# Patient Record
Sex: Female | Born: 1980 | Race: White | Hispanic: No | Smoking: Former smoker
Health system: Southern US, Community
[De-identification: ages and names within clinical notes are randomized; demographics above are authoritative.]

## PROBLEM LIST (undated history)

## (undated) DIAGNOSIS — F32A Depression, unspecified: Secondary | ICD-10-CM

## (undated) DIAGNOSIS — K529 Noninfective gastroenteritis and colitis, unspecified: Secondary | ICD-10-CM

## (undated) DIAGNOSIS — F329 Major depressive disorder, single episode, unspecified: Secondary | ICD-10-CM

## (undated) DIAGNOSIS — R519 Headache, unspecified: Secondary | ICD-10-CM

## (undated) DIAGNOSIS — G43909 Migraine, unspecified, not intractable, without status migrainosus: Secondary | ICD-10-CM

## (undated) DIAGNOSIS — D497 Neoplasm of unspecified behavior of endocrine glands and other parts of nervous system: Secondary | ICD-10-CM

## (undated) DIAGNOSIS — E039 Hypothyroidism, unspecified: Secondary | ICD-10-CM

## (undated) DIAGNOSIS — R51 Headache: Secondary | ICD-10-CM

## (undated) DIAGNOSIS — R45851 Suicidal ideations: Secondary | ICD-10-CM

## (undated) HISTORY — PX: TUBAL LIGATION: SHX77

## (undated) HISTORY — PX: TONSILLECTOMY: SUR1361

## (undated) HISTORY — DX: Headache: R51

## (undated) HISTORY — DX: Migraine, unspecified, not intractable, without status migrainosus: G43.909

## (undated) HISTORY — DX: Headache, unspecified: R51.9

## (undated) HISTORY — DX: Depression, unspecified: F32.A

## (undated) HISTORY — DX: Major depressive disorder, single episode, unspecified: F32.9

## (undated) SURGERY — COLONOSCOPY WITH PROPOFOL
Anesthesia: General

---
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
## 2004-02-28 ENCOUNTER — Emergency Department (HOSPITAL_COMMUNITY): Admission: EM | Admit: 2004-02-28 | Discharge: 2004-02-28 | Payer: Self-pay | Admitting: Emergency Medicine

## 2010-06-19 HISTORY — PX: BREAST CYST ASPIRATION: SHX578

## 2010-08-05 ENCOUNTER — Emergency Department (HOSPITAL_COMMUNITY)
Admission: EM | Admit: 2010-08-05 | Discharge: 2010-08-06 | Disposition: A | Discharge status: Home / Self Care | Payer: Self-pay | Attending: Emergency Medicine | Admitting: Emergency Medicine

## 2010-08-05 DIAGNOSIS — E039 Hypothyroidism, unspecified: Secondary | ICD-10-CM | POA: Insufficient documentation

## 2010-08-05 DIAGNOSIS — A499 Bacterial infection, unspecified: Secondary | ICD-10-CM | POA: Insufficient documentation

## 2010-08-05 DIAGNOSIS — M545 Low back pain, unspecified: Secondary | ICD-10-CM | POA: Insufficient documentation

## 2010-08-05 DIAGNOSIS — B9689 Other specified bacterial agents as the cause of diseases classified elsewhere: Secondary | ICD-10-CM | POA: Insufficient documentation

## 2010-08-05 DIAGNOSIS — N760 Acute vaginitis: Secondary | ICD-10-CM | POA: Insufficient documentation

## 2010-08-05 DIAGNOSIS — R109 Unspecified abdominal pain: Secondary | ICD-10-CM | POA: Insufficient documentation

## 2010-08-05 LAB — WET PREP, GENITAL
Trich, Wet Prep: NONE SEEN
Yeast Wet Prep HPF POC: NONE SEEN

## 2010-08-05 LAB — URINALYSIS, ROUTINE W REFLEX MICROSCOPIC
Bilirubin Urine: NEGATIVE
Ketones, ur: NEGATIVE mg/dL
Leukocytes, UA: NEGATIVE
Protein, ur: NEGATIVE mg/dL
Urine Glucose, Fasting: NEGATIVE mg/dL

## 2010-08-05 LAB — URINE MICROSCOPIC-ADD ON

## 2010-08-06 ENCOUNTER — Emergency Department (HOSPITAL_COMMUNITY): Payer: Self-pay

## 2010-08-06 LAB — CBC
MCV: 99.7 fL (ref 78.0–100.0)
Platelets: 217 10*3/uL (ref 150–400)
RBC: 3.69 MIL/uL — ABNORMAL LOW (ref 3.87–5.11)
RDW: 13.1 % (ref 11.5–15.5)
WBC: 7.9 10*3/uL (ref 4.0–10.5)

## 2010-08-06 LAB — POCT I-STAT, CHEM 8
Calcium, Ion: 1.13 mmol/L (ref 1.12–1.32)
Glucose, Bld: 98 mg/dL (ref 70–99)
HCT: 38 % (ref 36.0–46.0)
Hemoglobin: 12.9 g/dL (ref 12.0–15.0)
TCO2: 25 mmol/L (ref 0–100)

## 2010-08-06 LAB — DIFFERENTIAL
Basophils Relative: 1 % (ref 0–1)
Eosinophils Absolute: 0.1 10*3/uL (ref 0.0–0.7)
Eosinophils Relative: 1 % (ref 0–5)
Neutrophils Relative %: 58 % (ref 43–77)

## 2010-08-06 LAB — BASIC METABOLIC PANEL
BUN: 5 mg/dL — ABNORMAL LOW (ref 6–23)
Chloride: 104 mEq/L (ref 96–112)
Creatinine, Ser: 0.65 mg/dL (ref 0.4–1.2)
GFR calc Af Amer: >60 mL/min (ref >60)
GFR calc non Af Amer: >60 mL/min (ref >60)
Potassium: 4.1 mEq/L (ref 3.5–5.1)

## 2010-08-06 LAB — OCCULT BLOOD, POC DEVICE: Fecal Occult Bld: NEGATIVE

## 2010-08-06 LAB — GC/CHLAMYDIA PROBE AMP, GENITAL
Chlamydia, DNA Probe: NEGATIVE
GC Probe Amp, Genital: NEGATIVE

## 2011-05-04 ENCOUNTER — Emergency Department (HOSPITAL_COMMUNITY)
Admission: EM | Admit: 2011-05-04 | Discharge: 2011-05-04 | Disposition: A | Discharge status: Home / Self Care | Payer: Self-pay | Attending: Emergency Medicine | Admitting: Emergency Medicine

## 2011-05-04 ENCOUNTER — Encounter: Payer: Self-pay | Admitting: Emergency Medicine

## 2011-05-04 ENCOUNTER — Emergency Department (HOSPITAL_COMMUNITY): Payer: Self-pay

## 2011-05-04 ENCOUNTER — Other Ambulatory Visit: Payer: Self-pay

## 2011-05-04 DIAGNOSIS — R0602 Shortness of breath: Secondary | ICD-10-CM | POA: Insufficient documentation

## 2011-05-04 DIAGNOSIS — Z79899 Other long term (current) drug therapy: Secondary | ICD-10-CM | POA: Insufficient documentation

## 2011-05-04 DIAGNOSIS — R000 Tachycardia, unspecified: Secondary | ICD-10-CM

## 2011-05-04 DIAGNOSIS — R112 Nausea with vomiting, unspecified: Secondary | ICD-10-CM | POA: Insufficient documentation

## 2011-05-04 DIAGNOSIS — R079 Chest pain, unspecified: Secondary | ICD-10-CM | POA: Insufficient documentation

## 2011-05-04 DIAGNOSIS — E039 Hypothyroidism, unspecified: Secondary | ICD-10-CM | POA: Insufficient documentation

## 2011-05-04 DIAGNOSIS — I498 Other specified cardiac arrhythmias: Secondary | ICD-10-CM | POA: Insufficient documentation

## 2011-05-04 DIAGNOSIS — L089 Local infection of the skin and subcutaneous tissue, unspecified: Secondary | ICD-10-CM | POA: Insufficient documentation

## 2011-05-04 DIAGNOSIS — R6889 Other general symptoms and signs: Secondary | ICD-10-CM | POA: Insufficient documentation

## 2011-05-04 HISTORY — DX: Hypothyroidism, unspecified: E03.9

## 2011-05-04 LAB — URINALYSIS, ROUTINE W REFLEX MICROSCOPIC
Bilirubin Urine: NEGATIVE
Ketones, ur: NEGATIVE mg/dL
Leukocytes, UA: NEGATIVE
Nitrite: NEGATIVE
Protein, ur: NEGATIVE mg/dL
Urobilinogen, UA: 0.2 mg/dL (ref 0.0–1.0)

## 2011-05-04 LAB — TSH: TSH: 1.624 u[IU]/mL (ref 0.350–4.500)

## 2011-05-04 LAB — BASIC METABOLIC PANEL
Calcium: 10.3 mg/dL (ref 8.4–10.5)
GFR calc non Af Amer: >90 mL/min (ref >90)
Glucose, Bld: 156 mg/dL — ABNORMAL HIGH (ref 70–99)
Potassium: 3.2 mEq/L — ABNORMAL LOW (ref 3.5–5.1)
Sodium: 136 mEq/L (ref 135–145)

## 2011-05-04 LAB — POCT I-STAT TROPONIN I: Troponin i, poc: 0 ng/mL (ref 0.00–0.08)

## 2011-05-04 LAB — CBC
Hemoglobin: 14.7 g/dL (ref 12.0–15.0)
MCH: 33.8 pg (ref 26.0–34.0)
MCHC: 34.8 g/dL (ref 30.0–36.0)
Platelets: 286 10*3/uL (ref 150–400)
RBC: 4.35 MIL/uL (ref 3.87–5.11)

## 2011-05-04 LAB — POCT PREGNANCY, URINE: Preg Test, Ur: NEGATIVE

## 2011-05-04 MED ORDER — DOXYCYCLINE HYCLATE 100 MG PO CAPS: 100.0000 mg | ORAL_CAPSULE | Freq: Two times a day (BID) | ORAL | Status: AC

## 2011-05-04 MED ORDER — LORAZEPAM 2 MG/ML IJ SOLN
1.0000 mg | Freq: Once | INTRAMUSCULAR | Status: AC
  Administered 2011-05-04: 1 mg via INTRAVENOUS
  Filled 2011-05-04: qty 1

## 2011-05-04 MED ORDER — LORAZEPAM 1 MG PO TABS: 1.0000 mg | ORAL_TABLET | Freq: Three times a day (TID) | ORAL | Status: AC | PRN

## 2011-05-04 MED ORDER — IOHEXOL 350 MG/ML SOLN
100.0000 mL | Freq: Once | INTRAVENOUS | Status: AC | PRN
  Administered 2011-05-04: 100 mL via INTRAVENOUS

## 2011-05-04 NOTE — ED Notes (Signed)
Pt c/o CP since 10 pm this evening.  States pain started after taking "sinus medicine" at 9:30.  Describes pain as burning in the substernal area.   Feels tense.  Did vomit earlier, no fevers/chills.  Feels SOB at this time.  Pain does not increase with palpation or inspiration.  No meds PTA.

## 2011-05-04 NOTE — ED Provider Notes (Signed)
Patient seen by me when she requested antibiotic and anxiety med at dispo.  Had been discussed with prior MD per nursing.  Patient not suicidal and felt appropriate for short course of ativan.  Doxy written for as previously recommended by other ED MD.  Cyndra Numbers, MD 05/04/11 [phone]

## 2011-05-04 NOTE — ED Notes (Signed)
PT. REPORTS MIDSTERNAL CHEST PAIN "BURNING" ONSET THIS EVENING   ,  SOB WITH NO COUGH , VOMITTING , DENIES DIAPHORESIS.

## 2011-05-04 NOTE — ED Provider Notes (Signed)
History     CSN: [id number] Arrival date & time: 05/04/2011  1:53 AM   First MD Initiated Contact with Patient 05/04/11 [phone]      Chief Complaint  Patient presents with  . Chest Pain    (Consider location/radiation/quality/duration/timing/severity/associated sxs/prior treatment) HPI So 30 year old white female who had the sudden onset of chest pain yesterday evening about 10 PM. She describes it as burning in nature with some tightness as well. It is moderate to severe. It is mildly exacerbated by deep breath. She also complains of shortness of breath specifically difficulty taking a deep breath. Soon after the onset of the pain she developed nausea and vomiting. The vomiting is a proved but there is some remaining nausea. She says she has felt ill recently and has a stuffy nose. She has taken over-the-counter medication but nothing that she has not taken in the past. She is on Adderall but denies taking any excess; her last dose was yesterday morning. She denies street drug use. She denies recent prolonged automobile or airplane trip. She denies swelling or pain in her lower extremities.  Past Medical History  Diagnosis Date  . Hypothyroidism     Past Surgical History  Procedure Date  . Tubal ligation   . Tonsillectomy     No family history on file.  History  Substance Use Topics  . Smoking status: Current Everyday Smoker  . Smokeless tobacco: Not on file  . Alcohol Use: No    OB History    Grav Para Term Preterm Abortions TAB SAB Ect Mult Living                  Review of Systems  All other systems reviewed and are negative.    Allergies  Review of patient's allergies indicates no known allergies.  Home Medications   Current Outpatient Rx  Name Route Sig Dispense Refill  . ACETAMINOPHEN 325 MG PO TABS Oral Take 325 mg by mouth every 6 (six) hours as needed.      . AMPHETAMINE-DEXTROAMPHETAMINE 30 MG PO TABS Oral Take 30 mg by mouth daily.      Marlin Canary  HEADACHE PO Oral Take 1 packet by mouth daily as needed. For pain     . LEVOTHYROXINE SODIUM 175 MCG PO TABS Oral Take 175 mcg by mouth daily.      Marland Kitchen PAROXETINE HCL 20 MG PO TABS Oral Take 20 mg by mouth every morning.      Marland Kitchen PHENYLEPHRINE HCL 10 MG PO TABS Oral Take 5 mg by mouth every 4 (four) hours as needed. For congestion       BP 123/81  Pulse 117  Temp(Src) 98 F (36.7 C) (Oral)  Resp 14  SpO2 100%  LMP 04/03/2011  Physical Exam General: Well-developed, well-nourished female in no acute distress; appearance consistent with age of record HENT: normocephalic, atraumatic Eyes: pupils equal round and reactive to light; extraocular muscles intact Neck: supple Heart: regular rate and rhythm; no murmurs; tachycardic Lungs: clear to auscultation bilaterally; shallow respirations Abdomen: soft; nontender; nondistended Extremities: No deformity; full range of motion; pulses normal; no edema or tenderness Neurologic: Awake, alert and oriented; motor function intact in all extremities and symmetric; no facial droop Skin: Warm and dry Psychiatric: Anxious    ED Course  Procedures (including critical care time)     MDM   Nursing notes and vitals signs, including pulse oximetry, reviewed.  Summary of this visit's results, reviewed by myself:  Labs:  Results  for orders placed during the hospital encounter of 05/04/11  CBC      Component Value Range   WBC 12.1 (*) 4.0 - 10.5 (K/uL)   RBC 4.35  3.87 - 5.11 (MIL/uL)   Hemoglobin 14.7  12.0 - 15.0 (g/dL)   HCT 40.9  81.1 - 91.4 (%)   MCV 97.2  78.0 - 100.0 (fL)   MCH 33.8  26.0 - 34.0 (pg)   MCHC 34.8  30.0 - 36.0 (g/dL)   RDW 78.2  95.6 - 21.3 (%)   Platelets 286  150 - 400 (K/uL)  BASIC METABOLIC PANEL      Component Value Range   Sodium 136  135 - 145 (mEq/L)   Potassium 3.2 (*) 3.5 - 5.1 (mEq/L)   Chloride 96  96 - 112 (mEq/L)   CO2 24  19 - 32 (mEq/L)   Glucose, Bld 156 (*) 70 - 99 (mg/dL)   BUN 10  6 - 23 (mg/dL)    Creatinine, Ser 0.86  0.50 - 1.10 (mg/dL)   Calcium 57.8  8.4 - 10.5 (mg/dL)   GFR calc non Af Amer >90  >90 (mL/min)   GFR calc Af Amer >90  >90 (mL/min)  POCT I-STAT TROPONIN I      Component Value Range   Troponin i, poc 0.00  0.00 - 0.08 (ng/mL)   Comment 3           URINALYSIS, ROUTINE W REFLEX MICROSCOPIC      Component Value Range   Color, Urine YELLOW  YELLOW    Appearance CLEAR  CLEAR    Specific Gravity, Urine 1.006  1.005 - 1.030    pH 7.5  5.0 - 8.0    Glucose, UA NEGATIVE  NEGATIVE (mg/dL)   Hgb urine dipstick NEGATIVE  NEGATIVE    Bilirubin Urine NEGATIVE  NEGATIVE    Ketones, ur NEGATIVE  NEGATIVE (mg/dL)   Protein, ur NEGATIVE  NEGATIVE (mg/dL)   Urobilinogen, UA 0.2  0.0 - 1.0 (mg/dL)   Nitrite NEGATIVE  NEGATIVE    Leukocytes, UA NEGATIVE  NEGATIVE   PREGNANCY, URINE      Component Value Range   Preg Test, Ur NEGATIVE    D-DIMER, QUANTITATIVE      Component Value Range   D-Dimer, Quant 0.46  0.00 - 0.48 (ug/mL-FEU)  POCT PREGNANCY, URINE      Component Value Range   Preg Test, Ur NEGATIVE    POCT I-STAT TROPONIN I      Component Value Range   Troponin i, poc 0.00  0.00 - 0.08 (ng/mL)   Comment 3             EKG Interpretation:  Date & Time: 05/04/2011 1:52 AM  Rate: 124  Rhythm: sinus tachycardia  QRS Axis: normal  Intervals: normal  ST/T Wave abnormalities: normal  Conduction Disutrbances:none  Narrative Interpretation:   Old EKG Reviewed: none available  6:42 AM Awaiting CT angina chest or unexplained symptoms. As an incidental patient complains of frequent pustules on her face for the past several weeks. She is being treated with Bactroban topically. She states they keep recurring and she request a by mouth antibiotic.   Dg Chest 2 View  05/04/2011  *RADIOLOGY REPORT*  Clinical Data: 30 year old female with shortness of breath, chest pain, vomiting.  CHEST - 2 VIEW  Comparison: None.  Findings: Normal lung volumes. Normal cardiac size and  mediastinal contours.  Visualized tracheal air column is within normal limits. No pneumothorax  or pneumoperitoneum.  The lungs are clear.  No pleural effusion.  Negative visualized bowel gas pattern.  No osseous abnormality identified.  IMPRESSION: Negative, no acute cardiopulmonary abnormality.  Original Report Authenticated By: Harley Hallmark, M.D.   Ct Angio Chest W/cm &/or Wo Cm  05/04/2011  *RADIOLOGY REPORT*  Clinical Data:  Chest pain.  Shortness of breath.  Pain began after taking sinus -7.  CT ANGIOGRAPHY CHEST WITH CONTRAST  Technique:  Multidetector CT imaging of the chest was performed using the standard protocol during bolus administration of intravenous contrast.  Multiplanar CT image reconstructions including MIPs were obtained to evaluate the vascular anatomy.  Contrast: OMNIPAQUE IOHEXOL 350 MG/ML IV SOLN  Comparison:  Two-view chest x-ray 05/04/2011.  Findings:  Pulmonary arterial opacification is satisfactory.  There are no focal filling defects to suggest pulmonary emboli. The study is mildly degraded at the lung bases due to patient breathing motion.  This limits evaluation of subsegmental vessels at the bases.  The heart size is normal.  No significant mediastinal or axillary adenopathy is present.  Limited imaging of the upper abdomen is unremarkable.  Minimal dependent atelectasis is present at the lung bases bilaterally.  The lungs are otherwise clear.  Review of the MIP images confirms the above findings.  IMPRESSION:  1.  No evidence of pulmonary embolus. 2.  The study is mildly degraded by patient breathing motion in both mid chest and lung bases.  This limits evaluation of subsegmental vessels.  Original Report Authenticated By: Jamesetta Orleans. MATTERN, M.D.   7:23 AM The patient denies any chest discomfort at this time. It resolved after she was given Ativan IV. She does not feel dyspneic. She continues to be tachycardic in the 130s. On further questioning she now admits  that she took Sudafed PE yesterday evening about an hour before her symptoms began. This could represent a severe reaction to the phenylephrine, especially given that she already takes Adderall.   7:42 AM Patient ambulating without chest pain, dyspnea or lightheadedness. Sinus tachycardia persists. TSH and free T4 level sent and patient will followup with her primary care physician at [facility].                 Hanley Seamen, MD 05/04/11 [phone]

## 2011-05-04 NOTE — ED Notes (Signed)
Pt. oob gait steady, walked pt. Around the unit. Pt. Denies any sob or chest pain or dizziness.

## 2011-09-13 ENCOUNTER — Encounter (HOSPITAL_COMMUNITY): Payer: Self-pay | Admitting: *Deleted

## 2011-09-13 ENCOUNTER — Emergency Department (HOSPITAL_COMMUNITY)
Admission: EM | Admit: 2011-09-13 | Discharge: 2011-09-13 | Disposition: A | Discharge status: Home / Self Care | Payer: Self-pay | Attending: Emergency Medicine | Admitting: Emergency Medicine

## 2011-09-13 DIAGNOSIS — S1191XA Laceration without foreign body of unspecified part of neck, initial encounter: Secondary | ICD-10-CM

## 2011-09-13 DIAGNOSIS — X789XXA Intentional self-harm by unspecified sharp object, initial encounter: Secondary | ICD-10-CM | POA: Insufficient documentation

## 2011-09-13 DIAGNOSIS — S1190XA Unspecified open wound of unspecified part of neck, initial encounter: Secondary | ICD-10-CM | POA: Insufficient documentation

## 2011-09-13 DIAGNOSIS — S61509A Unspecified open wound of unspecified wrist, initial encounter: Secondary | ICD-10-CM | POA: Insufficient documentation

## 2011-09-13 DIAGNOSIS — S61511A Laceration without foreign body of right wrist, initial encounter: Secondary | ICD-10-CM

## 2011-09-13 DIAGNOSIS — E039 Hypothyroidism, unspecified: Secondary | ICD-10-CM | POA: Insufficient documentation

## 2011-09-13 DIAGNOSIS — F32A Depression, unspecified: Secondary | ICD-10-CM

## 2011-09-13 DIAGNOSIS — Z79899 Other long term (current) drug therapy: Secondary | ICD-10-CM | POA: Insufficient documentation

## 2011-09-13 DIAGNOSIS — F329 Major depressive disorder, single episode, unspecified: Secondary | ICD-10-CM

## 2011-09-13 HISTORY — DX: Suicidal ideations: R45.851

## 2011-09-13 LAB — POCT PREGNANCY, URINE: Preg Test, Ur: NEGATIVE

## 2011-09-13 LAB — BASIC METABOLIC PANEL
CO2: 25 mEq/L (ref 19–32)
Chloride: 103 mEq/L (ref 96–112)
GFR calc Af Amer: >90 mL/min (ref >90)
Potassium: 4.4 mEq/L (ref 3.5–5.1)
Sodium: 136 mEq/L (ref 135–145)

## 2011-09-13 LAB — RAPID URINE DRUG SCREEN, HOSP PERFORMED
Barbiturates: NOT DETECTED
Benzodiazepines: NOT DETECTED
Tetrahydrocannabinol: NOT DETECTED

## 2011-09-13 LAB — DIFFERENTIAL
Basophils Absolute: 0 10*3/uL (ref 0.0–0.1)
Lymphocytes Relative: 28 % (ref 12–46)
Neutro Abs: 7.2 10*3/uL (ref 1.7–7.7)
Neutrophils Relative %: 65 % (ref 43–77)

## 2011-09-13 LAB — CBC
HCT: 41.8 % (ref 36.0–46.0)
Platelets: 238 10*3/uL (ref 150–400)
RDW: 13 % (ref 11.5–15.5)
WBC: 11.1 10*3/uL — ABNORMAL HIGH (ref 4.0–10.5)

## 2011-09-13 MED ORDER — PAROXETINE HCL 40 MG PO TABS: 40.0000 mg | ORAL_TABLET | ORAL | Status: DC

## 2011-09-13 MED ORDER — LORAZEPAM 1 MG PO TABS
1.0000 mg | ORAL_TABLET | Freq: Once | ORAL | Status: AC
  Administered 2011-09-13: 1 mg via ORAL
  Filled 2011-09-13: qty 1

## 2011-09-13 MED ORDER — SODIUM CHLORIDE 0.9 % IV BOLUS (SEPSIS)
1000.0000 mL | Freq: Once | INTRAVENOUS | Status: AC
  Administered 2011-09-13: 1000 mL via INTRAVENOUS

## 2011-09-13 MED ORDER — TETANUS-DIPHTH-ACELL PERTUSSIS 5-2.5-18.5 LF-MCG/0.5 IM SUSP
0.5000 mL | Freq: Once | INTRAMUSCULAR | Status: AC
  Administered 2011-09-13: 0.5 mL via INTRAMUSCULAR
  Filled 2011-09-13: qty 0.5

## 2011-09-13 MED ORDER — DIAZEPAM 5 MG PO TABS: 5.0000 mg | ORAL_TABLET | Freq: Every day | ORAL | Status: AC | PRN

## 2011-09-13 NOTE — Discharge Instructions (Signed)
Follow up with psychiatrist as instructed and shown in your handouts
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
## 2011-09-13 NOTE — ED Notes (Signed)
Rx given x2 D/c instructions reviewed w/ pt - pt denies any further questions or concerns at present.   
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
## 2011-09-13 NOTE — ED Notes (Signed)
Faxed telespsych paperwork 

## 2011-09-13 NOTE — ED Notes (Signed)
Pt currently in telepsych in rm 30
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
## 2011-09-14 NOTE — ED Provider Notes (Signed)
I saw and evaluated the patient, reviewed the resident's note and I agree with the findings and plan.   Ashlen Kiger, MD 09/14/11 1712 

## 2012-03-27 ENCOUNTER — Emergency Department (HOSPITAL_COMMUNITY)
Admission: EM | Admit: 2012-03-27 | Discharge: 2012-03-28 | Disposition: A | Discharge status: Home / Self Care | Payer: Medicaid Other | Attending: Emergency Medicine | Admitting: Emergency Medicine

## 2012-03-27 ENCOUNTER — Encounter (HOSPITAL_COMMUNITY): Payer: Self-pay | Admitting: Emergency Medicine

## 2012-03-27 DIAGNOSIS — N390 Urinary tract infection, site not specified: Secondary | ICD-10-CM | POA: Insufficient documentation

## 2012-03-27 DIAGNOSIS — F17200 Nicotine dependence, unspecified, uncomplicated: Secondary | ICD-10-CM | POA: Insufficient documentation

## 2012-03-27 DIAGNOSIS — E039 Hypothyroidism, unspecified: Secondary | ICD-10-CM | POA: Insufficient documentation

## 2012-03-27 HISTORY — DX: Neoplasm of unspecified behavior of endocrine glands and other parts of nervous system: D49.7

## 2012-03-27 NOTE — ED Notes (Addendum)
Pt reports having lower back pain, dysuria, hematuria

## 2012-03-28 LAB — URINE MICROSCOPIC-ADD ON

## 2012-03-28 LAB — URINALYSIS, ROUTINE W REFLEX MICROSCOPIC
Nitrite: NEGATIVE
Specific Gravity, Urine: 1.005 (ref 1.005–1.030)
Urobilinogen, UA: 0.2 mg/dL (ref 0.0–1.0)
pH: 6 (ref 5.0–8.0)

## 2012-03-28 MED ORDER — CIPROFLOXACIN HCL 500 MG PO TABS: 500.0000 mg | ORAL_TABLET | Freq: Two times a day (BID) | ORAL | Status: DC

## 2012-03-28 MED ORDER — HYDROCODONE-ACETAMINOPHEN 5-325 MG PO TABS
2.0000 | ORAL_TABLET | Freq: Once | ORAL | Status: AC
  Administered 2012-03-28: 2 via ORAL
  Filled 2012-03-28: qty 2

## 2012-03-28 MED ORDER — FLUCONAZOLE 150 MG PO TABS: 150.0000 mg | ORAL_TABLET | Freq: Once | ORAL | Status: DC

## 2012-03-28 MED ORDER — PHENAZOPYRIDINE HCL 200 MG PO TABS: 200.0000 mg | ORAL_TABLET | Freq: Three times a day (TID) | ORAL | Status: DC | PRN

## 2012-03-28 MED ORDER — HYDROCODONE-ACETAMINOPHEN 5-500 MG PO TABS: 1.0000 | ORAL_TABLET | Freq: Four times a day (QID) | ORAL | Status: DC | PRN

## 2012-03-28 NOTE — ED Provider Notes (Signed)
History     CSN: [id number]  Arrival date & time 03/27/12  2217   First MD Initiated Contact with Patient 03/27/12 2316      Chief Complaint  Patient presents with  . Urinary Tract Infection    (Consider location/radiation/quality/duration/timing/severity/associated sxs/prior treatment) Patient is a 31 y.o. female presenting with urinary tract infection. The history is provided by the patient.  Urinary Tract Infection This is a new problem. The current episode started 2 days ago. The problem occurs constantly. The problem has been gradually worsening. Associated symptoms include abdominal pain. Exacerbated by: urinating. Nothing relieves the symptoms. She has tried acetaminophen for the symptoms. The treatment provided no relief.    Past Medical History  Diagnosis Date  . Suicidal intent   . Hypothyroidism   . Pituitary tumor     Past Surgical History  Procedure Date  . Tubal ligation   . Tonsillectomy     History reviewed. No pertinent family history.  History  Substance Use Topics  . Smoking status: Current Every Day Smoker -- 1.0 packs/day    Types: Cigarettes  . Smokeless tobacco: Not on file  . Alcohol Use: No    OB History    Grav Para Term Preterm Abortions TAB SAB Ect Mult Living                  Review of Systems  Gastrointestinal: Positive for abdominal pain.  All other systems reviewed and are negative.    Allergies  Review of patient's allergies indicates no known allergies.  Home Medications   Current Outpatient Rx  Name Route Sig Dispense Refill  . AMPHETAMINE-DEXTROAMPHETAMINE 30 MG PO TABS Oral Take 30 mg by mouth daily.      Marland Kitchen FLUOXETINE HCL 20 MG PO CAPS Oral Take 20 mg by mouth daily.    Marland Kitchen LEVOTHYROXINE SODIUM 175 MCG PO TABS Oral Take 175 mcg by mouth daily.      Marland Kitchen LORAZEPAM 0.5 MG PO TABS Oral Take 0.5 mg by mouth at bedtime as needed. For anxiety/sleep    . NAPROXEN SODIUM 220 MG PO TABS Oral Take 220 mg by mouth 2 (two) times  daily with a meal.      BP 107/69  Pulse 91  Temp 97.9 F (36.6 C) (Oral)  Resp 18  SpO2 100%  LMP 03/22/2012  Physical Exam  Nursing note and vitals reviewed. Constitutional: She is oriented to person, place, and time. She appears well-developed and well-nourished. No distress.  HENT:  Head: Normocephalic and atraumatic.  Neck: Normal range of motion. Neck supple.  Cardiovascular: Normal rate and regular rhythm.  Exam reveals no gallop and no friction rub.   No murmur heard. Pulmonary/Chest: Effort normal and breath sounds normal. No respiratory distress. She has no wheezes.  Abdominal: Soft. Bowel sounds are normal. She exhibits no distension. There is no tenderness.  Musculoskeletal: Normal range of motion.  Neurological: She is alert and oriented to person, place, and time.  Skin: Skin is warm and dry. She is not diaphoretic.    ED Course  Procedures (including critical care time)  Labs Reviewed  URINALYSIS, ROUTINE W REFLEX MICROSCOPIC - Abnormal; Notable for the following:    APPearance CLOUDY (*)     Hgb urine dipstick MODERATE (*)     Leukocytes, UA MODERATE (*)     All other components within normal limits  URINE MICROSCOPIC-ADD ON - Abnormal; Notable for the following:    Squamous Epithelial / LPF MANY (*)  Bacteria, UA MANY (*)     All other components within normal limits  POCT PREGNANCY, URINE   No results found.   No diagnosis found.    MDM  The patient presents with dysuria, back pain that is consistent with a uti.  The urine is diagnostic of this as well.  This does not sound like a kidney stone.  She will be treated with cipro, pyridium, and lortab.  She is to return prn if she worsens.          Geoffery Lyons, MD 03/28/12 [phone]

## 2014-03-20 ENCOUNTER — Encounter (HOSPITAL_COMMUNITY): Payer: Self-pay | Admitting: Emergency Medicine

## 2014-03-20 ENCOUNTER — Emergency Department (HOSPITAL_COMMUNITY)
Admission: EM | Admit: 2014-03-20 | Discharge: 2014-03-20 | Disposition: A | Discharge status: Home / Self Care | Payer: Medicaid Other | Attending: Emergency Medicine | Admitting: Emergency Medicine

## 2014-03-20 ENCOUNTER — Emergency Department (HOSPITAL_COMMUNITY): Payer: Medicaid Other

## 2014-03-20 DIAGNOSIS — Z720 Tobacco use: Secondary | ICD-10-CM | POA: Diagnosis not present

## 2014-03-20 DIAGNOSIS — R1013 Epigastric pain: Secondary | ICD-10-CM | POA: Insufficient documentation

## 2014-03-20 DIAGNOSIS — R110 Nausea: Secondary | ICD-10-CM | POA: Insufficient documentation

## 2014-03-20 DIAGNOSIS — R0602 Shortness of breath: Secondary | ICD-10-CM | POA: Diagnosis not present

## 2014-03-20 DIAGNOSIS — Z8639 Personal history of other endocrine, nutritional and metabolic disease: Secondary | ICD-10-CM | POA: Diagnosis not present

## 2014-03-20 DIAGNOSIS — Z9851 Tubal ligation status: Secondary | ICD-10-CM | POA: Insufficient documentation

## 2014-03-20 DIAGNOSIS — Z3202 Encounter for pregnancy test, result negative: Secondary | ICD-10-CM | POA: Diagnosis not present

## 2014-03-20 DIAGNOSIS — Z8659 Personal history of other mental and behavioral disorders: Secondary | ICD-10-CM | POA: Diagnosis not present

## 2014-03-20 DIAGNOSIS — E039 Hypothyroidism, unspecified: Secondary | ICD-10-CM | POA: Insufficient documentation

## 2014-03-20 DIAGNOSIS — Z9889 Other specified postprocedural states: Secondary | ICD-10-CM | POA: Insufficient documentation

## 2014-03-20 DIAGNOSIS — R6883 Chills (without fever): Secondary | ICD-10-CM | POA: Diagnosis not present

## 2014-03-20 DIAGNOSIS — Z79899 Other long term (current) drug therapy: Secondary | ICD-10-CM | POA: Diagnosis not present

## 2014-03-20 DIAGNOSIS — M549 Dorsalgia, unspecified: Secondary | ICD-10-CM | POA: Diagnosis not present

## 2014-03-20 DIAGNOSIS — Z7982 Long term (current) use of aspirin: Secondary | ICD-10-CM | POA: Insufficient documentation

## 2014-03-20 LAB — URINALYSIS, ROUTINE W REFLEX MICROSCOPIC
Bilirubin Urine: NEGATIVE
GLUCOSE, UA: NEGATIVE mg/dL
Ketones, ur: NEGATIVE mg/dL
Nitrite: NEGATIVE
PH: 6.5 (ref 5.0–8.0)
Protein, ur: NEGATIVE mg/dL
SPECIFIC GRAVITY, URINE: 1.005 (ref 1.005–1.030)
Urobilinogen, UA: 0.2 mg/dL (ref 0.0–1.0)

## 2014-03-20 LAB — URINE MICROSCOPIC-ADD ON

## 2014-03-20 LAB — COMPREHENSIVE METABOLIC PANEL
ALK PHOS: 56 U/L (ref 39–117)
ALT: 13 U/L (ref 0–35)
AST: 18 U/L (ref 0–37)
Albumin: 4.1 g/dL (ref 3.5–5.2)
Anion gap: 11 (ref 5–15)
BUN: 10 mg/dL (ref 6–23)
CHLORIDE: 102 meq/L (ref 96–112)
CO2: 26 meq/L (ref 19–32)
Calcium: 9.3 mg/dL (ref 8.4–10.5)
Creatinine, Ser: 0.64 mg/dL (ref 0.50–1.10)
GLUCOSE: 89 mg/dL (ref 70–99)
POTASSIUM: 4.4 meq/L (ref 3.7–5.3)
SODIUM: 139 meq/L (ref 137–147)
Total Bilirubin: <0.2 mg/dL — ABNORMAL LOW (ref 0.3–1.2)
Total Protein: 7.4 g/dL (ref 6.0–8.3)

## 2014-03-20 LAB — CBC WITH DIFFERENTIAL/PLATELET
BASOS PCT: 1 % (ref 0–1)
Basophils Absolute: 0 10*3/uL (ref 0.0–0.1)
Eosinophils Absolute: 0 10*3/uL (ref 0.0–0.7)
Eosinophils Relative: 1 % (ref 0–5)
HEMATOCRIT: 40.9 % (ref 36.0–46.0)
HEMOGLOBIN: 13.5 g/dL (ref 12.0–15.0)
LYMPHS ABS: 2 10*3/uL (ref 0.7–4.0)
LYMPHS PCT: 31 % (ref 12–46)
MCH: 33.7 pg (ref 26.0–34.0)
MCHC: 33 g/dL (ref 30.0–36.0)
MCV: 102 fL — ABNORMAL HIGH (ref 78.0–100.0)
MONO ABS: 0.7 10*3/uL (ref 0.1–1.0)
MONOS PCT: 11 % (ref 3–12)
NEUTROS ABS: 3.8 10*3/uL (ref 1.7–7.7)
Neutrophils Relative %: 58 % (ref 43–77)
Platelets: 241 10*3/uL (ref 150–400)
RBC: 4.01 MIL/uL (ref 3.87–5.11)
RDW: 12.4 % (ref 11.5–15.5)
WBC: 6.5 10*3/uL (ref 4.0–10.5)

## 2014-03-20 LAB — LIPASE, BLOOD: Lipase: 16 U/L (ref 11–59)

## 2014-03-20 LAB — PREGNANCY, URINE: Preg Test, Ur: NEGATIVE

## 2014-03-20 LAB — I-STAT TROPONIN, ED: Troponin i, poc: 0 ng/mL (ref 0.00–0.08)

## 2014-03-20 MED ORDER — FENTANYL CITRATE 0.05 MG/ML IJ SOLN
100.0000 ug | Freq: Once | INTRAMUSCULAR | Status: AC
  Administered 2014-03-20: 100 ug via INTRAVENOUS
  Filled 2014-03-20: qty 2

## 2014-03-20 MED ORDER — OMEPRAZOLE 20 MG PO CPDR: 20.0000 mg | DELAYED_RELEASE_CAPSULE | Freq: Two times a day (BID) | ORAL | Status: DC

## 2014-03-20 MED ORDER — RANITIDINE HCL 150 MG PO TABS: 150.0000 mg | ORAL_TABLET | Freq: Every day | ORAL | Status: DC

## 2014-03-20 MED ORDER — HYDROCODONE-ACETAMINOPHEN 5-325 MG PO TABS: 2.0000 | ORAL_TABLET | ORAL | Status: DC | PRN

## 2014-03-20 MED ORDER — GI COCKTAIL ~~LOC~~
30.0000 mL | Freq: Once | ORAL | Status: AC
  Administered 2014-03-20: 30 mL via ORAL
  Filled 2014-03-20: qty 30

## 2014-03-20 MED ORDER — ONDANSETRON HCL 4 MG/2ML IJ SOLN
4.0000 mg | Freq: Once | INTRAMUSCULAR | Status: AC
  Administered 2014-03-20: 4 mg via INTRAVENOUS
  Filled 2014-03-20: qty 2

## 2014-03-20 NOTE — ED Provider Notes (Signed)
Medical screening examination/treatment/procedure(s) were performed by non-physician practitioner and as supervising physician I was immediately available for consultation/collaboration.   EKG Interpretation   Date/Time:  Friday March 20 2014 08:21:53 EDT Ventricular Rate:  103 PR Interval:  118 QRS Duration: 85 QT Interval:  342 QTC Calculation: 448 R Axis:   76 Text Interpretation:  Sinus tachycardia Minimal ST depression, inferior  leads agree. no change from old. Confirmed by Johnney Killian, MD, Jeannie Done [phone])  on 03/20/2014 1:21:32 PM       Charlesetta Shanks, MD 03/20/14 [phone]

## 2014-03-20 NOTE — ED Notes (Signed)
Pt endorses abdominal pain woke her up from her sleep this AM around 0400. Nauseated. Denies emesis. Denies Diarrhea.

## 2014-03-20 NOTE — ED Provider Notes (Signed)
CSN: [id number]     Arrival date & time 03/20/14  0998 History   First MD Initiated Contact with Patient 03/20/14 [phone]     Chief Complaint  Patient presents with  . Abdominal Pain  . Back Pain   Patient is a 33 y.o. female presenting with abdominal pain. The history is provided by the patient. No language interpreter was used.  Abdominal Pain Pain location:  Epigastric Pain quality: burning   Pain radiates to:  Back Pain severity:  Severe Onset quality:  Sudden Duration:  4 hours Timing:  Intermittent Progression:  Waxing and waning Chronicity:  New Context: awakening from sleep   Context: not alcohol use, not diet changes, not eating, not laxative use, not medication withdrawal, not previous surgeries, not recent illness, not recent travel, not retching, not sick contacts, not suspicious food intake and not trauma   Relieved by:  Nothing Worsened by:  Nothing tried Ineffective treatments:  None tried Associated symptoms: chills, nausea and shortness of breath   Associated symptoms: no belching, no cough, no diarrhea, no fatigue, no flatus, no hematemesis, no hematochezia, no hematuria, no melena, no sore throat, no vaginal bleeding, no vaginal discharge and no vomiting   Risk factors: NSAID use   Risk factors: no alcohol abuse, no aspirin use, not elderly, has not had multiple surgeries, not obese, not pregnant and no recent hospitalization     Past Medical History  Diagnosis Date  . Suicidal intent   . Hypothyroidism   . Pituitary tumor    Past Surgical History  Procedure Laterality Date  . Tubal ligation    . Tonsillectomy     No family history on file. History  Substance Use Topics  . Smoking status: Current Every Day Smoker -- 1.00 packs/day    Types: Cigarettes  . Smokeless tobacco: Not on file  . Alcohol Use: No   OB History   Grav Para Term Preterm Abortions TAB SAB Ect Mult Living                 Review of Systems  Constitutional: Positive for chills.  Negative for fatigue.  HENT: Negative for sore throat.   Respiratory: Positive for shortness of breath. Negative for cough.   Gastrointestinal: Positive for nausea and abdominal pain. Negative for vomiting, diarrhea, melena, hematochezia, flatus and hematemesis.  Genitourinary: Negative for hematuria, vaginal bleeding and vaginal discharge.  All other systems reviewed and are negative.     Allergies  Review of patient's allergies indicates no known allergies.  Home Medications   Prior to Admission medications   Medication Sig Start Date End Date Taking? Authorizing Provider  ALPRAZolam Duanne Moron) 0.5 MG tablet Take 0.5 mg by mouth 3 (three) times daily as needed for anxiety.   Yes Historical Provider, MD  aspirin-acetaminophen-caffeine (EXCEDRIN MIGRAINE) [phone] MG per tablet Take 2 tablets by mouth every 6 (six) hours as needed for headache or migraine.   Yes Historical Provider, MD  FLUoxetine (PROZAC) 40 MG capsule Take 40 mg by mouth daily.   Yes Historical Provider, MD  levothyroxine (SYNTHROID, LEVOTHROID) 175 MCG tablet Take 175 mcg by mouth daily.     Yes Historical Provider, MD  HYDROcodone-acetaminophen (NORCO/VICODIN) 5-325 MG per tablet Take 2 tablets by mouth every 4 (four) hours as needed for moderate pain or severe pain. 03/20/14   Nalu Troublefield A Forcucci, PA-C  omeprazole (PRILOSEC) 20 MG capsule Take 1 capsule (20 mg total) by mouth 2 (two) times daily before a meal. 03/20/14  Khrystina Bonnes A Forcucci, PA-C  ranitidine (ZANTAC) 150 MG tablet Take 1 tablet (150 mg total) by mouth at bedtime. 03/20/14   Jonna Dittrich A Forcucci, PA-C   BP 101/68  Pulse 71  Resp 15  Ht 5\' 2"  (1.575 m)  Wt 130 lb (58.968 kg)  BMI 23.77 kg/m2  SpO2 99%  LMP 02/27/2014 Physical Exam  Nursing note and vitals reviewed. Constitutional: She is oriented to person, place, and time. She appears well-developed and well-nourished. No distress.  HENT:  Head: Normocephalic and atraumatic.  Mouth/Throat:  Oropharynx is clear and moist. No oropharyngeal exudate.  Eyes: Conjunctivae and EOM are normal. Pupils are equal, round, and reactive to light. No scleral icterus.  Neck: Normal range of motion. Neck supple. No JVD present. No thyromegaly present.  Cardiovascular: Normal rate, regular rhythm, normal heart sounds and intact distal pulses.  Exam reveals no gallop and no friction rub.   No murmur heard. Pulmonary/Chest: Effort normal and breath sounds normal. No respiratory distress. She has no wheezes. She has no rales. She exhibits no tenderness.  Abdominal: Soft. Normal appearance and bowel sounds are normal. She exhibits no distension and no mass. There is tenderness in the epigastric area. There is no rigidity, no rebound, no guarding, no tenderness at McBurney's point and negative Murphy's sign.  Musculoskeletal: Normal range of motion.  Lymphadenopathy:    She has no cervical adenopathy.  Neurological: She is alert and oriented to person, place, and time. She has normal strength. No cranial nerve deficit or sensory deficit. Coordination normal.  Skin: Skin is warm and dry. She is not diaphoretic.  Psychiatric: She has a normal mood and affect. Her behavior is normal. Judgment and thought content normal.    ED Course  Procedures (including critical care time) Labs Review Labs Reviewed  CBC WITH DIFFERENTIAL - Abnormal; Notable for the following:    MCV 102.0 (*)    All other components within normal limits  URINALYSIS, ROUTINE W REFLEX MICROSCOPIC - Abnormal; Notable for the following:    APPearance CLOUDY (*)    Hgb urine dipstick SMALL (*)    Leukocytes, UA MODERATE (*)    All other components within normal limits  COMPREHENSIVE METABOLIC PANEL - Abnormal; Notable for the following:    Total Bilirubin <0.2 (*)    All other components within normal limits  URINE MICROSCOPIC-ADD ON - Abnormal; Notable for the following:    Bacteria, UA MANY (*)    All other components within  normal limits  PREGNANCY, URINE  LIPASE, BLOOD  I-STAT TROPOININ, ED    Imaging Review Dg Chest 2 View  03/20/2014   CLINICAL DATA:  Abdomen and back pain since early this morning. Headaches and nausea. Smoking history.  EXAM: CHEST  2 VIEW  COMPARISON:  CT 05/04/2011.  FINDINGS: Mediastinum and hilar structures normal. Lungs are clear. Heart size normal. No pleural effusion or pneumothorax. No acute bony abnormality.  IMPRESSION: No active cardiopulmonary disease.   Electronically Signed   By: Marcello Moores  Register   On: 03/20/2014 08:57   Dg Abd 2 Views  03/20/2014   CLINICAL DATA:  Abdominal pain and back pain with headache and nausea.  EXAM: ABDOMEN - 2 VIEW  COMPARISON:  None.  FINDINGS: Upright exam demonstrates no free air beneath the hemidiaphragms. There are no dilated loops tumor large or small bowel. There is a moderate volume stool throughout the colon and rectosigmoid. No pathologic calcifications. No osseous abnormality.  IMPRESSION: 1. Moderate volume stool throughout the colon.  2. No bowel obstruction   Electronically Signed   By: Suzy Bouchard M.D.   On: 03/20/2014 09:06   US Abdomen Limited Ruq  03/20/2014   CLINICAL DATA:  Abdominal and back pain with nausea today also headache ; history of hypertension  EXAM: US ABDOMEN LIMITED - RIGHT UPPER QUADRANT  COMPARISON:  Abdominal series of today's date.  FINDINGS: Gallbladder:  No gallstones or wall thickening visualized. No sonographic Murphy sign noted.  Common bile duct:  Diameter: 3.2 mm  Liver:  No focal lesion identified. There is no intrahepatic ductal dilation. Within normal limits in parenchymal echogenicity.  IMPRESSION: Normal limited right upper quadrant ultrasound examination.   Electronically Signed   By: David  Martinique   On: 03/20/2014 14:06     EKG Interpretation   Date/Time:  Friday March 20 2014 08:21:53 EDT Ventricular Rate:  103 PR Interval:  118 QRS Duration: 85 QT Interval:  342 QTC Calculation: 448 R  Axis:   76 Text Interpretation:  Sinus tachycardia Minimal ST depression, inferior  leads agree. no change from old. Confirmed by Johnney Killian, MD, Jeannie Done [phone])  on 03/20/2014 1:21:32 PM      MDM   Final diagnoses:  Epigastric pain    Patient is a 33 y.o. Female who presents to the ED with epigastric abdominal pain x 4 hours.  Physical exam reveals epigastric tenderness with negative murphys sign.  CBC, CMP, lipase are negative.  Urine shows moderate leukocytes and many bacteria.  Given no urinary symptoms will culture urine at this time.  Urine pregnancy is negative.  DG abdomen and chest revealed moderate constipation.  RUQ ultrasound was negative.  Suspect PUD vs GERD vs.  Gastritis at this time.  Will discharge the patient home at this time with zantac, prilosec, and hydrocodone.  Patient to follow-up with GI.  Patient to return for coffee ground emesis, hematemesis, or intractable pain.  Patient states understanding and agreement at this time.  Patient seen and examined by Dr. Johnney Killian who agrees with the above plan and workup.  Patient stable for discharge.      Cherylann Parr, PA-C 03/20/14 1446

## 2014-03-20 NOTE — ED Notes (Signed)
PA at bedside.

## 2014-03-20 NOTE — ED Notes (Signed)
Patient transported to Ultrasound 

## 2014-03-20 NOTE — Discharge Instructions (Signed)
Gastroesophageal Reflux Disease, Adult Gastroesophageal reflux disease (GERD) happens when acid from your stomach flows up into the esophagus. When acid comes in contact with the esophagus, the acid causes soreness (inflammation) in the esophagus. Over time, GERD may create small holes (ulcers) in the lining of the esophagus. CAUSES   Increased body weight. This puts pressure on the stomach, making acid rise from the stomach into the esophagus.  Smoking. This increases acid production in the stomach.  Drinking alcohol. This causes decreased pressure in the lower esophageal sphincter (valve or ring of muscle between the esophagus and stomach), allowing acid from the stomach into the esophagus.  Late evening meals and a full stomach. This increases pressure and acid production in the stomach.  A malformed lower esophageal sphincter. Sometimes, no cause is found. SYMPTOMS   Burning pain in the lower part of the mid-chest behind the breastbone and in the mid-stomach area. This may occur twice a week or more often.  Trouble swallowing.  Sore throat.  Dry cough.  Asthma-like symptoms including chest tightness, shortness of breath, or wheezing. DIAGNOSIS  Your caregiver may be able to diagnose GERD based on your symptoms. In some cases, X-rays and other tests may be done to check for complications or to check the condition of your stomach and esophagus. TREATMENT  Your caregiver may recommend over-the-counter or prescription medicines to help decrease acid production. Ask your caregiver before starting or adding any new medicines.  HOME CARE INSTRUCTIONS   Change the factors that you can control. Ask your caregiver for guidance concerning weight loss, quitting smoking, and alcohol consumption.  Avoid foods and drinks that make your symptoms worse, such as:  Caffeine or alcoholic drinks.  Chocolate.  Peppermint or mint flavorings.  Garlic and onions.  Spicy foods.  Citrus fruits,  such as oranges, lemons, or limes.  Tomato-based foods such as sauce, chili, salsa, and pizza.  Fried and fatty foods.  Avoid lying down for the 3 hours prior to your bedtime or prior to taking a nap.  Eat small, frequent meals instead of large meals.  Wear loose-fitting clothing. Do not wear anything tight around your waist that causes pressure on your stomach.  Raise the head of your bed 6 to 8 inches with wood blocks to help you sleep. Extra pillows will not help.  Only take over-the-counter or prescription medicines for pain, discomfort, or fever as directed by your caregiver.  Do not take aspirin, ibuprofen, or other nonsteroidal anti-inflammatory drugs (NSAIDs). SEEK IMMEDIATE MEDICAL CARE IF:   You have pain in your arms, neck, jaw, teeth, or back.  Your pain increases or changes in intensity or duration.  You develop nausea, vomiting, or sweating (diaphoresis).  You develop shortness of breath, or you faint.  Your vomit is green, yellow, black, or looks like coffee grounds or blood.  Your stool is red, bloody, or black. These symptoms could be signs of other problems, such as heart disease, gastric bleeding, or esophageal bleeding. MAKE SURE YOU:   Understand these instructions.  Will watch your condition.  Will get help right away if you are not doing well or get worse. Document Released: 03/15/2005 Document Revised: 08/28/2011 Document Reviewed: 12/23/2010 Exit[employer] Patient Information 2015 [employer]. This information is not intended to replace advice given to you by your health care provider. Make sure you discuss any questions you have with your health care provider. Peptic Ulcer A peptic ulcer is a sore in the lining of your esophagus (esophageal ulcer),   stomach (gastric ulcer), or in the first part of your small intestine (duodenal ulcer). The ulcer causes erosion into the deeper tissue. CAUSES  Normally, the lining of the stomach and the small intestine  protects itself from the acid that digests food. The protective lining can be damaged by:  An infection caused by a bacterium called Helicobacter pylori (H. pylori).  Regular use of nonsteroidal anti-inflammatory drugs (NSAIDs), such as ibuprofen or aspirin.  Smoking tobacco. Other risk factors include being older than 50, drinking alcohol excessively, and having a family history of ulcer disease.  SYMPTOMS   Burning pain or gnawing in the area between the chest and the belly button.  Heartburn.  Nausea and vomiting.  Bloating. The pain can be worse on an empty stomach and at night. If the ulcer results in bleeding, it can cause:  Black, tarry stools.  Vomiting of bright red blood.  Vomiting of coffee-ground-looking materials. DIAGNOSIS  A diagnosis is usually made based upon your history and an exam. Other tests and procedures may be performed to find the cause of the ulcer. Finding a cause will help determine the best treatment. Tests and procedures may include:  Blood tests, stool tests, or breath tests to check for the bacterium H. pylori.  An upper gastrointestinal (GI) series of the esophagus, stomach, and small intestine.  An endoscopy to examine the esophagus, stomach, and small intestine.  A biopsy. TREATMENT  Treatment may include:  Eliminating the cause of the ulcer, such as smoking, NSAIDs, or alcohol.  Medicines to reduce the amount of acid in your digestive tract.  Antibiotic medicines if the ulcer is caused by the H. pylori bacterium.  An upper endoscopy to treat a bleeding ulcer.  Surgery if the bleeding is severe or if the ulcer created a hole somewhere in the digestive system. HOME CARE INSTRUCTIONS   Avoid tobacco, alcohol, and caffeine. Smoking can increase the acid in the stomach, and continued smoking will impair the healing of ulcers.  Avoid foods and drinks that seem to cause discomfort or aggravate your ulcer.  Only take medicines as  directed by your caregiver. Do not substitute over-the-counter medicines for prescription medicines without talking to your caregiver.  Keep any follow-up appointments and tests as directed. SEEK MEDICAL CARE IF:   Your do not improve within 7 days of starting treatment.  You have ongoing indigestion or heartburn. SEEK IMMEDIATE MEDICAL CARE IF:   You have sudden, sharp, or persistent abdominal pain.  You have bloody or dark black, tarry stools.  You vomit blood or vomit that looks like coffee grounds.  You become light-headed, weak, or feel faint.  You become sweaty or clammy. MAKE SURE YOU:   Understand these instructions.  Will watch your condition.  Will get help right away if you are not doing well or get worse. Document Released: 06/02/2000 Document Revised: 10/20/2013 Document Reviewed: 01/03/2012 ExitCare Patient Information 2015 [employer]. This information is not intended to replace advice given to you by your health care provider. Make sure you discuss any questions you have with your health care provider.  

## 2014-03-20 NOTE — ED Notes (Signed)
Patient transported to X-ray 

## 2014-05-01 ENCOUNTER — Encounter (HOSPITAL_COMMUNITY): Payer: Self-pay | Admitting: *Deleted

## 2014-05-01 DIAGNOSIS — R110 Nausea: Secondary | ICD-10-CM | POA: Diagnosis not present

## 2014-05-01 DIAGNOSIS — Z720 Tobacco use: Secondary | ICD-10-CM | POA: Diagnosis not present

## 2014-05-01 DIAGNOSIS — Z79899 Other long term (current) drug therapy: Secondary | ICD-10-CM | POA: Diagnosis not present

## 2014-05-01 DIAGNOSIS — R1011 Right upper quadrant pain: Secondary | ICD-10-CM | POA: Insufficient documentation

## 2014-05-01 DIAGNOSIS — Z3202 Encounter for pregnancy test, result negative: Secondary | ICD-10-CM | POA: Insufficient documentation

## 2014-05-01 DIAGNOSIS — E039 Hypothyroidism, unspecified: Secondary | ICD-10-CM | POA: Insufficient documentation

## 2014-05-01 LAB — COMPREHENSIVE METABOLIC PANEL
ALT: 13 U/L (ref 0–35)
AST: 14 U/L (ref 0–37)
Albumin: 3.3 g/dL — ABNORMAL LOW (ref 3.5–5.2)
Alkaline Phosphatase: 72 U/L (ref 39–117)
Anion gap: 14 (ref 5–15)
BUN: 6 mg/dL (ref 6–23)
CHLORIDE: 101 meq/L (ref 96–112)
CO2: 25 mEq/L (ref 19–32)
CREATININE: 0.67 mg/dL (ref 0.50–1.10)
Calcium: 9.1 mg/dL (ref 8.4–10.5)
GFR calc Af Amer: >90 mL/min (ref >90)
GFR calc non Af Amer: >90 mL/min (ref >90)
Glucose, Bld: 91 mg/dL (ref 70–99)
Potassium: 3.8 mEq/L (ref 3.7–5.3)
SODIUM: 140 meq/L (ref 137–147)
Total Protein: 6.9 g/dL (ref 6.0–8.3)

## 2014-05-01 LAB — URINALYSIS, ROUTINE W REFLEX MICROSCOPIC
Bilirubin Urine: NEGATIVE
Glucose, UA: NEGATIVE mg/dL
Ketones, ur: NEGATIVE mg/dL
Leukocytes, UA: NEGATIVE
NITRITE: NEGATIVE
Protein, ur: NEGATIVE mg/dL
Specific Gravity, Urine: 1.006 (ref 1.005–1.030)
UROBILINOGEN UA: 1 mg/dL (ref 0.0–1.0)
pH: 7.5 (ref 5.0–8.0)

## 2014-05-01 LAB — CBC WITH DIFFERENTIAL/PLATELET
BASOS ABS: 0 10*3/uL (ref 0.0–0.1)
BASOS PCT: 1 % (ref 0–1)
Eosinophils Absolute: 0.1 10*3/uL (ref 0.0–0.7)
Eosinophils Relative: 1 % (ref 0–5)
HEMATOCRIT: 35.5 % — AB (ref 36.0–46.0)
Hemoglobin: 11.9 g/dL — ABNORMAL LOW (ref 12.0–15.0)
Lymphocytes Relative: 36 % (ref 12–46)
Lymphs Abs: 2.5 10*3/uL (ref 0.7–4.0)
MCH: 33.9 pg (ref 26.0–34.0)
MCHC: 33.5 g/dL (ref 30.0–36.0)
MCV: 101.1 fL — ABNORMAL HIGH (ref 78.0–100.0)
Monocytes Absolute: 0.7 10*3/uL (ref 0.1–1.0)
Monocytes Relative: 9 % (ref 3–12)
NEUTROS ABS: 3.7 10*3/uL (ref 1.7–7.7)
Neutrophils Relative %: 53 % (ref 43–77)
PLATELETS: 232 10*3/uL (ref 150–400)
RBC: 3.51 MIL/uL — ABNORMAL LOW (ref 3.87–5.11)
RDW: 12.6 % (ref 11.5–15.5)
WBC: 7 10*3/uL (ref 4.0–10.5)

## 2014-05-01 LAB — TROPONIN I: Troponin I: <0.3 ng/mL (ref <0.30)

## 2014-05-01 LAB — URINE MICROSCOPIC-ADD ON

## 2014-05-01 LAB — PREGNANCY, URINE: Preg Test, Ur: NEGATIVE

## 2014-05-01 LAB — LIPASE, BLOOD: LIPASE: 18 U/L (ref 11–59)

## 2014-05-01 MED ORDER — OXYCODONE-ACETAMINOPHEN 5-325 MG PO TABS
1.0000 | ORAL_TABLET | Freq: Once | ORAL | Status: AC
  Administered 2014-05-01: 1 via ORAL
  Filled 2014-05-01: qty 1

## 2014-05-01 NOTE — ED Notes (Signed)
Pt reports intermittent upper abdominal pain radiating around abdomen to back for three days. Pt reports nausea, denies v/d, reports some constipation, and weakness with pain. Pt states she has been taking antacids without relief.

## 2014-05-02 ENCOUNTER — Emergency Department (HOSPITAL_COMMUNITY): Payer: Medicaid Other

## 2014-05-02 ENCOUNTER — Emergency Department (HOSPITAL_COMMUNITY)
Admission: EM | Admit: 2014-05-02 | Discharge: 2014-05-02 | Disposition: A | Discharge status: Home / Self Care | Payer: Medicaid Other | Attending: Emergency Medicine | Admitting: Emergency Medicine

## 2014-05-02 DIAGNOSIS — R1011 Right upper quadrant pain: Secondary | ICD-10-CM

## 2014-05-02 MED ORDER — FAMOTIDINE 20 MG PO TABS
20.0000 mg | ORAL_TABLET | Freq: Once | ORAL | Status: AC
  Administered 2014-05-02: 20 mg via ORAL
  Filled 2014-05-02: qty 1

## 2014-05-02 MED ORDER — FENTANYL CITRATE 0.05 MG/ML IJ SOLN
50.0000 ug | Freq: Once | INTRAMUSCULAR | Status: AC
  Administered 2014-05-02: 50 ug via NASAL
  Filled 2014-05-02: qty 2

## 2014-05-02 MED ORDER — GI COCKTAIL ~~LOC~~
30.0000 mL | Freq: Once | ORAL | Status: AC
  Administered 2014-05-02: 30 mL via ORAL
  Filled 2014-05-02: qty 30

## 2014-05-02 MED ORDER — SUCRALFATE 1 G PO TABS: 1.0000 g | ORAL_TABLET | Freq: Two times a day (BID) | ORAL | Status: DC

## 2014-05-02 NOTE — ED Notes (Signed)
Pt reports she was here 3 weeks ago for the same, pt states "they told me to come back if I didn't get any better." Pt states "they gave me something the last time I was here and it was like a miracle."

## 2014-05-02 NOTE — ED Notes (Signed)
Pt is calling a friend for a ride home. This RN will evaluate pt prior to leaving to reassess pain sp medication administered

## 2014-05-02 NOTE — ED Notes (Signed)
Patient transported to Ultrasound 

## 2014-05-02 NOTE — ED Provider Notes (Signed)
CSN: [id number]     Arrival date & time 05/01/14  2108 History   First MD Initiated Contact with Patient 05/02/14 0014     Chief Complaint  Patient presents with  . Abdominal Pain     (Consider location/radiation/quality/duration/timing/severity/associated sxs/prior Treatment) HPI Comments: Patient is a 33 year old female past medical history significant for hypothyroidism, pituitary tumor, tobacco abuse presenting to the emergency department for three-day history of right upper quadrant squeezing pain with radiation to back with associated nausea without fever, chills, vomiting, diarrhea, shortness of breath or chest pain. She's not tried anything at home for her pain. Denies any alleviating or aggravating factors. She states this feels very similar to when she was seen emergency department 3 weeks ago. She states she is not followed up with gastroenterology as advised. Abdominal surgical history includes tubal ligation.    Past Medical History  Diagnosis Date  . Suicidal intent   . Hypothyroidism   . Pituitary tumor    Past Surgical History  Procedure Laterality Date  . Tubal ligation    . Tonsillectomy     History reviewed. No pertinent family history. History  Substance Use Topics  . Smoking status: Current Every Day Smoker -- 1.00 packs/day    Types: Cigarettes  . Smokeless tobacco: Not on file  . Alcohol Use: No   OB History    No data available     Review of Systems  Gastrointestinal: Positive for nausea and abdominal pain. Negative for vomiting and diarrhea.  All other systems reviewed and are negative.     Allergies  Review of patient's allergies indicates no known allergies.  Home Medications   Prior to Admission medications   Medication Sig Start Date End Date Taking? Authorizing Provider  ALPRAZolam Duanne Moron) 0.5 MG tablet Take 0.5 mg by mouth 3 (three) times daily as needed for anxiety.   Yes Historical Provider, MD  aspirin-acetaminophen-caffeine  (EXCEDRIN MIGRAINE) [phone] MG per tablet Take 2 tablets by mouth every 6 (six) hours as needed for headache or migraine.   Yes Historical Provider, MD  FLUoxetine (PROZAC) 40 MG capsule Take 40 mg by mouth daily.   Yes Historical Provider, MD  levothyroxine (SYNTHROID, LEVOTHROID) 175 MCG tablet Take 175 mcg by mouth daily.     Yes Historical Provider, MD  omeprazole (PRILOSEC) 20 MG capsule Take 1 capsule (20 mg total) by mouth 2 (two) times daily before a meal. 03/20/14  Yes Courtney A Forcucci, PA-C  ranitidine (ZANTAC) 150 MG tablet Take 1 tablet (150 mg total) by mouth at bedtime. 03/20/14  Yes Courtney A Forcucci, PA-C  HYDROcodone-acetaminophen (NORCO/VICODIN) 5-325 MG per tablet Take 2 tablets by mouth every 4 (four) hours as needed for moderate pain or severe pain. 03/20/14   Courtney A Forcucci, PA-C  sucralfate (CARAFATE) 1 G tablet Take 1 tablet (1 g total) by mouth 2 (two) times daily with a meal. 05/02/14   Ismael Karge L Noble Cicalese, PA-C   BP 100/67 mmHg  Pulse 80  Temp(Src) 98.5 F (36.9 C) (Oral)  Resp 16  SpO2 97%  LMP 04/16/2014 (Exact Date) Physical Exam  Constitutional: She is oriented to person, place, and time. She appears well-developed and well-nourished. No distress.  HENT:  Head: Normocephalic and atraumatic.  Right Ear: External ear normal.  Left Ear: External ear normal.  Nose: Nose normal.  Mouth/Throat: Oropharynx is clear and moist. No oropharyngeal exudate.  Eyes: Conjunctivae are normal.  Neck: Normal range of motion. Neck supple.  Cardiovascular: Normal rate, regular rhythm  and normal heart sounds.   Pulmonary/Chest: Effort normal and breath sounds normal. No respiratory distress.  Abdominal: Soft. Bowel sounds are normal. She exhibits no distension. There is no tenderness. There is no rigidity, no rebound and no guarding.  Musculoskeletal: Normal range of motion.  Neurological: She is alert and oriented to person, place, and time.  Skin: Skin is warm  and dry. She is not diaphoretic.  Psychiatric: She has a normal mood and affect.  Nursing note and vitals reviewed.   ED Course  Procedures (including critical care time) Medications  oxyCODONE-acetaminophen (PERCOCET/ROXICET) 5-325 MG per tablet 1 tablet (1 tablet Oral Given 05/01/14 2152)  famotidine (PEPCID) tablet 20 mg (20 mg Oral Given 05/02/14 0149)  fentaNYL (SUBLIMAZE) injection 50 mcg (50 mcg Nasal Given 05/02/14 0238)  gi cocktail (Maalox,Lidocaine,Donnatal) (30 mLs Oral Given 05/02/14 0239)    Labs Review Labs Reviewed  CBC WITH DIFFERENTIAL - Abnormal; Notable for the following:    RBC 3.51 (*)    Hemoglobin 11.9 (*)    HCT 35.5 (*)    MCV 101.1 (*)    All other components within normal limits  COMPREHENSIVE METABOLIC PANEL - Abnormal; Notable for the following:    Albumin 3.3 (*)    Total Bilirubin <0.2 (*)    All other components within normal limits  URINALYSIS, ROUTINE W REFLEX MICROSCOPIC - Abnormal; Notable for the following:    Hgb urine dipstick TRACE (*)    All other components within normal limits  URINE MICROSCOPIC-ADD ON - Abnormal; Notable for the following:    Bacteria, UA FEW (*)    All other components within normal limits  LIPASE, BLOOD  TROPONIN I  PREGNANCY, URINE    Imaging Review US Abdomen Complete  05/02/2014   CLINICAL DATA:  RIGHT upper quadrant pain.  EXAM: ULTRASOUND ABDOMEN COMPLETE  COMPARISON:  Abdominal radiographs March 20, 2014  FINDINGS: Gallbladder: No gallstones or wall thickening visualized. No sonographic Murphy sign noted.  Common bile duct: Diameter: 4 mm  Liver: No focal lesion identified. Within normal limits in parenchymal echogenicity.  IVC: No abnormality visualized.  Pancreas: Visualized portion unremarkable.  Spleen: Size and appearance within normal limits.  Right Kidney: Length: 9.8 cm. Echogenicity within normal limits. No mass or hydronephrosis visualized.  Left Kidney: Length: 10.2 cm. Echogenicity within normal  limits. No mass or hydronephrosis visualized.  Abdominal aorta: No aneurysm visualized.  Other findings: None.  IMPRESSION: Normal abdominal sonogram.   Electronically Signed   By: Elon Alas   On: 05/02/2014 01:27     EKG Interpretation None      MDM   Final diagnoses:  RUQ abdominal pain    Filed Vitals:   05/02/14 0230  BP: 100/67  Pulse: 80  Temp:   Resp: 16   Afebrile, NAD, non-toxic appearing, AAOx4.  Patient is nontoxic, nonseptic appearing, in no apparent distress.  Patient's pain and other symptoms adequately managed in emergency department.  Labs, imaging and vitals reviewed.  Patient does not meet the SIRS or Sepsis criteria.  On repeat exam patient does not have a surgical abdomen and there are no peritoneal signs.  No indication of appendicitis, bowel obstruction, bowel perforation, cholecystitis, diverticulitis, PID or ectopic pregnancy.  Patient discharged home with symptomatic treatment and given strict instructions for follow-up with their primary care physician and the gastroenterologist.  I have also discussed reasons to return immediately to the ER.  Patient expresses understanding and agrees with plan. Patient is stable at time of  discharge        Harlow Mares, PA-C 05/02/14 0407  Julianne Rice, MD 05/03/14 [phone]

## 2014-05-02 NOTE — Discharge Instructions (Signed)
Please follow up with your primary care physician in 1-2 days. If you do not have one please call the [place] number listed above. Please take your medications as prescribed. Please follow up with the [facility] to schedule a follow up appointment. Please read all discharge instructions and return precautions.    Abdominal Pain Many things can cause abdominal pain. Usually, abdominal pain is not caused by a disease and will improve without treatment. It can often be observed and treated at home. Your health care provider will do a physical exam and possibly order blood tests and X-rays to help determine the seriousness of your pain. However, in many cases, more time must pass before a clear cause of the pain can be found. Before that point, your health care provider may not know if you need more testing or further treatment. HOME CARE INSTRUCTIONS  Monitor your abdominal pain for any changes. The following actions may help to alleviate any discomfort you are experiencing:  Only take over-the-counter or prescription medicines as directed by your health care provider.  Do not take laxatives unless directed to do so by your health care provider.  Try a clear liquid diet (broth, tea, or water) as directed by your health care provider. Slowly move to a bland diet as tolerated. SEEK MEDICAL CARE IF:  You have unexplained abdominal pain.  You have abdominal pain associated with nausea or diarrhea.  You have pain when you urinate or have a bowel movement.  You experience abdominal pain that wakes you in the night.  You have abdominal pain that is worsened or improved by eating food.  You have abdominal pain that is worsened with eating fatty foods.  You have a fever. SEEK IMMEDIATE MEDICAL CARE IF:   Your pain does not go away within 2 hours.  You keep throwing up (vomiting).  Your pain is felt only in portions of the abdomen, such as the right side or the left  lower portion of the abdomen.  You pass bloody or black tarry stools. MAKE SURE YOU:  Understand these instructions.   Will watch your condition.   Will get help right away if you are not doing well or get worse.  Document Released: 03/15/2005 Document Revised: 06/10/2013 Document Reviewed: 02/12/2013 [facility] Patient Information 2015 [place], [place]. This information is not intended to replace advice given to you by your health care provider. Make sure you discuss any questions you have with your health care provider.

## 2014-05-02 NOTE — ED Notes (Signed)
Pt presents with intermittent upper abd pain radiating around to her back and nausea. Pt denies vomiting, diarrhea, SOB, abnormal vaginal odor, bleeding, or discharge. Pt describes her pain as a "burning contraction" around her ribs

## 2014-05-03 ENCOUNTER — Encounter (HOSPITAL_COMMUNITY): Payer: Self-pay | Admitting: *Deleted

## 2014-05-03 ENCOUNTER — Inpatient Hospital Stay (HOSPITAL_COMMUNITY)
Admission: EM | Admit: 2014-05-03 | Discharge: 2014-05-05 | DRG: 392 | Disposition: A | Discharge status: Home / Self Care | Payer: Medicaid Other | Attending: Internal Medicine | Admitting: Internal Medicine

## 2014-05-03 DIAGNOSIS — E039 Hypothyroidism, unspecified: Secondary | ICD-10-CM | POA: Diagnosis present

## 2014-05-03 DIAGNOSIS — Z79899 Other long term (current) drug therapy: Secondary | ICD-10-CM

## 2014-05-03 DIAGNOSIS — M549 Dorsalgia, unspecified: Secondary | ICD-10-CM

## 2014-05-03 DIAGNOSIS — K529 Noninfective gastroenteritis and colitis, unspecified: Principal | ICD-10-CM | POA: Diagnosis present

## 2014-05-03 DIAGNOSIS — K5900 Constipation, unspecified: Secondary | ICD-10-CM | POA: Diagnosis present

## 2014-05-03 DIAGNOSIS — F17210 Nicotine dependence, cigarettes, uncomplicated: Secondary | ICD-10-CM | POA: Diagnosis present

## 2014-05-03 HISTORY — DX: Noninfective gastroenteritis and colitis, unspecified: K52.9

## 2014-05-03 LAB — CBC WITH DIFFERENTIAL/PLATELET
BASOS ABS: 0.1 10*3/uL (ref 0.0–0.1)
BASOS PCT: 0 % (ref 0–1)
EOS ABS: 0.1 10*3/uL (ref 0.0–0.7)
EOS PCT: 1 % (ref 0–5)
HEMATOCRIT: 35.1 % — AB (ref 36.0–46.0)
Hemoglobin: 11.7 g/dL — ABNORMAL LOW (ref 12.0–15.0)
Lymphocytes Relative: 21 % (ref 12–46)
Lymphs Abs: 2.5 10*3/uL (ref 0.7–4.0)
MCH: 33 pg (ref 26.0–34.0)
MCHC: 33.3 g/dL (ref 30.0–36.0)
MCV: 98.9 fL (ref 78.0–100.0)
Monocytes Absolute: 1.2 10*3/uL — ABNORMAL HIGH (ref 0.1–1.0)
Monocytes Relative: 10 % (ref 3–12)
Neutro Abs: 8.3 10*3/uL — ABNORMAL HIGH (ref 1.7–7.7)
Neutrophils Relative %: 68 % (ref 43–77)
Platelets: 286 10*3/uL (ref 150–400)
RBC: 3.55 MIL/uL — ABNORMAL LOW (ref 3.87–5.11)
RDW: 12.3 % (ref 11.5–15.5)
WBC: 12 10*3/uL — ABNORMAL HIGH (ref 4.0–10.5)

## 2014-05-03 LAB — URINALYSIS, ROUTINE W REFLEX MICROSCOPIC
Bilirubin Urine: NEGATIVE
Glucose, UA: NEGATIVE mg/dL
Hgb urine dipstick: NEGATIVE
KETONES UR: NEGATIVE mg/dL
NITRITE: NEGATIVE
PH: 7.5 (ref 5.0–8.0)
PROTEIN: NEGATIVE mg/dL
Specific Gravity, Urine: 1.017 (ref 1.005–1.030)
Urobilinogen, UA: 1 mg/dL (ref 0.0–1.0)

## 2014-05-03 LAB — COMPREHENSIVE METABOLIC PANEL
ALT: 11 U/L (ref 0–35)
AST: 13 U/L (ref 0–37)
Albumin: 3.3 g/dL — ABNORMAL LOW (ref 3.5–5.2)
Alkaline Phosphatase: 72 U/L (ref 39–117)
Anion gap: 14 (ref 5–15)
BUN: 13 mg/dL (ref 6–23)
CALCIUM: 9.1 mg/dL (ref 8.4–10.5)
CO2: 24 mEq/L (ref 19–32)
CREATININE: 0.62 mg/dL (ref 0.50–1.10)
Chloride: 98 mEq/L (ref 96–112)
GFR calc Af Amer: >90 mL/min (ref >90)
GFR calc non Af Amer: >90 mL/min (ref >90)
Glucose, Bld: 107 mg/dL — ABNORMAL HIGH (ref 70–99)
Potassium: 3.9 mEq/L (ref 3.7–5.3)
Sodium: 136 mEq/L — ABNORMAL LOW (ref 137–147)
Total Bilirubin: <0.2 mg/dL — ABNORMAL LOW (ref 0.3–1.2)
Total Protein: 6.7 g/dL (ref 6.0–8.3)

## 2014-05-03 LAB — URINE MICROSCOPIC-ADD ON

## 2014-05-03 LAB — LIPASE, BLOOD: LIPASE: 18 U/L (ref 11–59)

## 2014-05-03 MED ORDER — KETOROLAC TROMETHAMINE 30 MG/ML IJ SOLN
30.0000 mg | Freq: Once | INTRAMUSCULAR | Status: AC
  Administered 2014-05-03: 30 mg via INTRAVENOUS
  Filled 2014-05-03: qty 1

## 2014-05-03 MED ORDER — ONDANSETRON HCL 4 MG/2ML IJ SOLN
4.0000 mg | Freq: Once | INTRAMUSCULAR | Status: AC
  Administered 2014-05-03: 4 mg via INTRAVENOUS
  Filled 2014-05-03: qty 2

## 2014-05-03 MED ORDER — SODIUM CHLORIDE 0.9 % IV BOLUS (SEPSIS)
500.0000 mL | Freq: Once | INTRAVENOUS | Status: AC
  Administered 2014-05-03: 500 mL via INTRAVENOUS

## 2014-05-03 MED ORDER — OXYCODONE-ACETAMINOPHEN 5-325 MG PO TABS
1.0000 | ORAL_TABLET | Freq: Once | ORAL | Status: AC
  Administered 2014-05-03: 1 via ORAL
  Filled 2014-05-03: qty 1

## 2014-05-03 NOTE — ED Provider Notes (Signed)
CSN: [id number]     Arrival date & time 05/03/14  2032 History   First MD Initiated Contact with Patient 05/03/14 2310     Chief Complaint  Patient presents with  . Abdominal Pain  . Flank Pain  . Nausea     (Consider location/radiation/quality/duration/timing/severity/associated sxs/prior Treatment) Patient is a 33 y.o. female presenting with flank pain. The history is provided by the patient.  Flank Pain This is a new problem. The current episode started more than 1 week ago. The problem occurs constantly. The problem has been gradually worsening. Pertinent negatives include no chest pain, no headaches and no shortness of breath. Nothing aggravates the symptoms. Nothing relieves the symptoms. She has tried nothing for the symptoms. The treatment provided no relief.    Past Medical History  Diagnosis Date  . Suicidal intent   . Hypothyroidism   . Pituitary tumor    Past Surgical History  Procedure Laterality Date  . Tubal ligation    . Tonsillectomy     History reviewed. No pertinent family history. History  Substance Use Topics  . Smoking status: Current Every Day Smoker -- 1.00 packs/day    Types: Cigarettes  . Smokeless tobacco: Not on file  . Alcohol Use: No   OB History    No data available     Review of Systems  Respiratory: Negative for shortness of breath.   Cardiovascular: Negative for chest pain.  Genitourinary: Positive for flank pain. Negative for dysuria.  Neurological: Negative for headaches.  All other systems reviewed and are negative.     Allergies  Review of patient's allergies indicates no known allergies.  Home Medications   Prior to Admission medications   Medication Sig Start Date End Date Taking? Authorizing Provider  ALPRAZolam Duanne Moron) 0.5 MG tablet Take 0.5 mg by mouth 3 (three) times daily as needed for anxiety.   Yes Historical Provider, MD  aspirin-acetaminophen-caffeine (EXCEDRIN MIGRAINE) [phone] MG per tablet Take 2 tablets  by mouth every 6 (six) hours as needed for headache or migraine.   Yes Historical Provider, MD  FLUoxetine (PROZAC) 40 MG capsule Take 40 mg by mouth daily.   Yes Historical Provider, MD  levothyroxine (SYNTHROID, LEVOTHROID) 175 MCG tablet Take 175 mcg by mouth daily.     Yes Historical Provider, MD  omeprazole (PRILOSEC) 20 MG capsule Take 1 capsule (20 mg total) by mouth 2 (two) times daily before a meal. 03/20/14  Yes Courtney A Forcucci, PA-C  ranitidine (ZANTAC) 150 MG tablet Take 1 tablet (150 mg total) by mouth at bedtime. 03/20/14  Yes Courtney A Forcucci, PA-C  sucralfate (CARAFATE) 1 G tablet Take 1 tablet (1 g total) by mouth 2 (two) times daily with a meal. 05/02/14  Yes Jennifer L Piepenbrink, PA-C  HYDROcodone-acetaminophen (NORCO/VICODIN) 5-325 MG per tablet Take 2 tablets by mouth every 4 (four) hours as needed for moderate pain or severe pain. 03/20/14   Courtney A Forcucci, PA-C   BP 106/64 mmHg  Pulse 82  Temp(Src) 98.6 F (37 C) (Oral)  Resp 16  Ht 5\' 3"  (1.6 m)  Wt 136 lb (61.689 kg)  BMI 24.10 kg/m2  SpO2 97%  LMP 04/16/2014 (Exact Date) Physical Exam  Constitutional: She is oriented to person, place, and time. She appears well-developed and well-nourished. No distress.  HENT:  Head: Normocephalic and atraumatic.  Mouth/Throat: Oropharynx is clear and moist.  Eyes: Conjunctivae are normal. Pupils are equal, round, and reactive to light.  Neck: Normal range of motion. Neck  supple.  Cardiovascular: Normal rate, regular rhythm and intact distal pulses.   Pulmonary/Chest: Effort normal and breath sounds normal. She has no wheezes. She has no rales.  Abdominal: Soft. Bowel sounds are increased. There is no tenderness. There is no rebound and no guarding.  Musculoskeletal: Normal range of motion.  Neurological: She is alert and oriented to person, place, and time.  Skin: Skin is warm and dry.  Psychiatric: She has a normal mood and affect.    ED Course  Procedures  (including critical care time) Labs Review Labs Reviewed  CBC WITH DIFFERENTIAL - Abnormal; Notable for the following:    WBC 12.0 (*)    RBC 3.55 (*)    Hemoglobin 11.7 (*)    HCT 35.1 (*)    Neutro Abs 8.3 (*)    Monocytes Absolute 1.2 (*)    All other components within normal limits  COMPREHENSIVE METABOLIC PANEL - Abnormal; Notable for the following:    Sodium 136 (*)    Glucose, Bld 107 (*)    Albumin 3.3 (*)    Total Bilirubin <0.2 (*)    All other components within normal limits  URINALYSIS, ROUTINE W REFLEX MICROSCOPIC - Abnormal; Notable for the following:    APPearance CLOUDY (*)    Leukocytes, UA TRACE (*)    All other components within normal limits  URINE MICROSCOPIC-ADD ON - Abnormal; Notable for the following:    Squamous Epithelial / LPF FEW (*)    All other components within normal limits  LIPASE, BLOOD    Imaging Review US Abdomen Complete  05/02/2014   CLINICAL DATA:  RIGHT upper quadrant pain.  EXAM: ULTRASOUND ABDOMEN COMPLETE  COMPARISON:  Abdominal radiographs March 20, 2014  FINDINGS: Gallbladder: No gallstones or wall thickening visualized. No sonographic Murphy sign noted.  Common bile duct: Diameter: 4 mm  Liver: No focal lesion identified. Within normal limits in parenchymal echogenicity.  IVC: No abnormality visualized.  Pancreas: Visualized portion unremarkable.  Spleen: Size and appearance within normal limits.  Right Kidney: Length: 9.8 cm. Echogenicity within normal limits. No mass or hydronephrosis visualized.  Left Kidney: Length: 10.2 cm. Echogenicity within normal limits. No mass or hydronephrosis visualized.  Abdominal aorta: No aneurysm visualized.  Other findings: None.  IMPRESSION: Normal abdominal sonogram.   Electronically Signed   By: Elon Alas   On: 05/02/2014 01:27     EKG Interpretation None      MDM   Final diagnoses:  Back pain    322 Dr. Brantley Stage seeing patient antibiotics and admit to medicine  330 case d/w Dr.  Arnoldo Morale admit to med surg fluid bolus  Medications  HYDROmorphone (DILAUDID) injection 1 mg (not administered)  ciprofloxacin (CIPRO) IVPB 400 mg (not administered)  metroNIDAZOLE (FLAGYL) IVPB 500 mg (not administered)  sodium chloride 0.9 % bolus 1,000 mL (not administered)  oxyCODONE-acetaminophen (PERCOCET/ROXICET) 5-325 MG per tablet 1 tablet (1 tablet Oral Given 05/03/14 2046)  ketorolac (TORADOL) 30 MG/ML injection 30 mg (30 mg Intravenous Given 05/03/14 2347)  sodium chloride 0.9 % bolus 500 mL (0 mLs Intravenous Stopped 05/04/14 0126)  ondansetron (ZOFRAN) injection 4 mg (4 mg Intravenous Given 05/03/14 2346)  HYDROcodone-acetaminophen (NORCO/VICODIN) 5-325 MG per tablet 2 tablet (2 tablets Oral Given 05/04/14 0208)  gi cocktail (Maalox,Lidocaine,Donnatal) (30 mLs Oral Given 05/04/14 0207)       Rachel Alexander Alfonso Patten, MD 05/04/14 [phone]

## 2014-05-03 NOTE — ED Notes (Addendum)
Pt c/o abdominal pain radiating to both flanks. Pt was seen 11/13 for exact same symptoms. Pt reports pain is worse than two days ago. Pt reports nausea, denies v/d. No fevers at home. Pt reports a decrease in urinary output. Pain radiates to bilateral flanks

## 2014-05-04 ENCOUNTER — Encounter (HOSPITAL_COMMUNITY): Payer: Self-pay | Admitting: Emergency Medicine

## 2014-05-04 ENCOUNTER — Emergency Department (HOSPITAL_COMMUNITY): Payer: Medicaid Other

## 2014-05-04 DIAGNOSIS — K529 Noninfective gastroenteritis and colitis, unspecified: Secondary | ICD-10-CM | POA: Diagnosis present

## 2014-05-04 DIAGNOSIS — E039 Hypothyroidism, unspecified: Secondary | ICD-10-CM | POA: Diagnosis present

## 2014-05-04 DIAGNOSIS — K5900 Constipation, unspecified: Secondary | ICD-10-CM | POA: Diagnosis present

## 2014-05-04 DIAGNOSIS — M549 Dorsalgia, unspecified: Secondary | ICD-10-CM | POA: Diagnosis not present

## 2014-05-04 DIAGNOSIS — Z79899 Other long term (current) drug therapy: Secondary | ICD-10-CM | POA: Diagnosis not present

## 2014-05-04 DIAGNOSIS — F17210 Nicotine dependence, cigarettes, uncomplicated: Secondary | ICD-10-CM | POA: Diagnosis present

## 2014-05-04 LAB — BASIC METABOLIC PANEL
Anion gap: 10 (ref 5–15)
BUN: 8 mg/dL (ref 6–23)
CO2: 25 mEq/L (ref 19–32)
Calcium: 7.6 mg/dL — ABNORMAL LOW (ref 8.4–10.5)
Chloride: 105 mEq/L (ref 96–112)
Creatinine, Ser: 0.6 mg/dL (ref 0.50–1.10)
GFR calc Af Amer: >90 mL/min (ref >90)
GLUCOSE: 87 mg/dL (ref 70–99)
Potassium: 3.6 mEq/L — ABNORMAL LOW (ref 3.7–5.3)
SODIUM: 140 meq/L (ref 137–147)

## 2014-05-04 LAB — CBC
HCT: 31 % — ABNORMAL LOW (ref 36.0–46.0)
Hemoglobin: 10.2 g/dL — ABNORMAL LOW (ref 12.0–15.0)
MCH: 33.2 pg (ref 26.0–34.0)
MCHC: 32.9 g/dL (ref 30.0–36.0)
MCV: 101 fL — AB (ref 78.0–100.0)
PLATELETS: 186 10*3/uL (ref 150–400)
RBC: 3.07 MIL/uL — ABNORMAL LOW (ref 3.87–5.11)
RDW: 12.6 % (ref 11.5–15.5)
WBC: 6.6 10*3/uL (ref 4.0–10.5)

## 2014-05-04 MED ORDER — SUCRALFATE 1 G PO TABS
1.0000 g | ORAL_TABLET | Freq: Two times a day (BID) | ORAL | Status: DC
  Administered 2014-05-04 – 2014-05-05 (×3): 1 g via ORAL
  Filled 2014-05-04 (×5): qty 1

## 2014-05-04 MED ORDER — CHLORHEXIDINE GLUCONATE 0.12 % MT SOLN
15.0000 mL | Freq: Two times a day (BID) | OROMUCOSAL | Status: DC
  Administered 2014-05-04 – 2014-05-05 (×3): 15 mL via OROMUCOSAL
  Filled 2014-05-04 (×3): qty 15

## 2014-05-04 MED ORDER — METRONIDAZOLE IN NACL 5-0.79 MG/ML-% IV SOLN
500.0000 mg | Freq: Once | INTRAVENOUS | Status: AC
  Administered 2014-05-04: 500 mg via INTRAVENOUS
  Filled 2014-05-04: qty 100

## 2014-05-04 MED ORDER — HYDROMORPHONE HCL 1 MG/ML IJ SOLN
1.0000 mg | Freq: Once | INTRAMUSCULAR | Status: AC
  Administered 2014-05-04: 1 mg via INTRAVENOUS
  Filled 2014-05-04: qty 1

## 2014-05-04 MED ORDER — SODIUM CHLORIDE 0.9 % IV SOLN
INTRAVENOUS | Status: DC
  Administered 2014-05-04 – 2014-05-05 (×2): via INTRAVENOUS

## 2014-05-04 MED ORDER — LEVOTHYROXINE SODIUM 175 MCG PO TABS
175.0000 ug | ORAL_TABLET | Freq: Every day | ORAL | Status: DC
  Administered 2014-05-04 – 2014-05-05 (×2): 175 ug via ORAL
  Filled 2014-05-04 (×3): qty 1

## 2014-05-04 MED ORDER — POLYETHYLENE GLYCOL 3350 17 G PO PACK
17.0000 g | PACK | Freq: Every day | ORAL | Status: DC
  Administered 2014-05-04 – 2014-05-05 (×2): 17 g via ORAL
  Filled 2014-05-04 (×2): qty 1

## 2014-05-04 MED ORDER — CETYLPYRIDINIUM CHLORIDE 0.05 % MT LIQD
7.0000 mL | Freq: Two times a day (BID) | OROMUCOSAL | Status: DC
Start: 2014-05-04 — End: 2014-05-05
  Administered 2014-05-04 – 2014-05-05 (×3): 7 mL via OROMUCOSAL

## 2014-05-04 MED ORDER — FLEET ENEMA 7-19 GM/118ML RE ENEM
1.0000 | ENEMA | Freq: Once | RECTAL | Status: AC
  Administered 2014-05-04: 1 via RECTAL
  Filled 2014-05-04: qty 1

## 2014-05-04 MED ORDER — PNEUMOCOCCAL VAC POLYVALENT 25 MCG/0.5ML IJ INJ
0.5000 mL | INJECTION | INTRAMUSCULAR | Status: DC
  Filled 2014-05-04: qty 0.5

## 2014-05-04 MED ORDER — HYDROCODONE-ACETAMINOPHEN 5-325 MG PO TABS
2.0000 | ORAL_TABLET | ORAL | Status: AC
  Administered 2014-05-04: 2 via ORAL
  Filled 2014-05-04: qty 2

## 2014-05-04 MED ORDER — ONDANSETRON HCL 4 MG/2ML IJ SOLN: 4.0000 mg | Freq: Four times a day (QID) | INTRAMUSCULAR | Status: DC | PRN

## 2014-05-04 MED ORDER — ALPRAZOLAM 0.5 MG PO TABS
0.5000 mg | ORAL_TABLET | Freq: Three times a day (TID) | ORAL | Status: DC | PRN
  Administered 2014-05-04 (×2): 0.5 mg via ORAL
  Filled 2014-05-04 (×2): qty 1

## 2014-05-04 MED ORDER — ACETAMINOPHEN 650 MG RE SUPP: 650.0000 mg | Freq: Four times a day (QID) | RECTAL | Status: DC | PRN

## 2014-05-04 MED ORDER — METRONIDAZOLE IN NACL 5-0.79 MG/ML-% IV SOLN
500.0000 mg | Freq: Three times a day (TID) | INTRAVENOUS | Status: DC
Start: 2014-05-04 — End: 2014-05-05
  Administered 2014-05-04 – 2014-05-05 (×3): 500 mg via INTRAVENOUS
  Filled 2014-05-04 (×5): qty 100

## 2014-05-04 MED ORDER — MAGNESIUM CITRATE PO SOLN
0.5000 | Freq: Once | ORAL | Status: AC
  Administered 2014-05-04: 0.5 via ORAL
  Filled 2014-05-04: qty 296

## 2014-05-04 MED ORDER — ONDANSETRON HCL 4 MG PO TABS: 4.0000 mg | ORAL_TABLET | Freq: Four times a day (QID) | ORAL | Status: DC | PRN

## 2014-05-04 MED ORDER — FLUOXETINE HCL 20 MG PO CAPS
40.0000 mg | ORAL_CAPSULE | Freq: Every day | ORAL | Status: DC
  Administered 2014-05-04 – 2014-05-05 (×2): 40 mg via ORAL
  Filled 2014-05-04 (×2): qty 2

## 2014-05-04 MED ORDER — ENOXAPARIN SODIUM 40 MG/0.4ML ~~LOC~~ SOLN
40.0000 mg | SUBCUTANEOUS | Status: DC
  Administered 2014-05-04 – 2014-05-05 (×2): 40 mg via SUBCUTANEOUS
  Filled 2014-05-04 (×2): qty 0.4

## 2014-05-04 MED ORDER — OXYCODONE HCL 5 MG PO TABS
5.0000 mg | ORAL_TABLET | ORAL | Status: DC | PRN
  Administered 2014-05-04 – 2014-05-05 (×4): 5 mg via ORAL
  Filled 2014-05-04 (×4): qty 1

## 2014-05-04 MED ORDER — ACETAMINOPHEN 325 MG PO TABS: 650.0000 mg | ORAL_TABLET | Freq: Four times a day (QID) | ORAL | Status: DC | PRN

## 2014-05-04 MED ORDER — CIPROFLOXACIN IN D5W 400 MG/200ML IV SOLN
400.0000 mg | Freq: Two times a day (BID) | INTRAVENOUS | Status: DC
  Administered 2014-05-04 – 2014-05-05 (×2): 400 mg via INTRAVENOUS
  Filled 2014-05-04 (×3): qty 200

## 2014-05-04 MED ORDER — SODIUM CHLORIDE 0.9 % IV BOLUS (SEPSIS)
1000.0000 mL | Freq: Once | INTRAVENOUS | Status: AC
  Administered 2014-05-04: 1000 mL via INTRAVENOUS

## 2014-05-04 MED ORDER — GI COCKTAIL ~~LOC~~
30.0000 mL | Freq: Once | ORAL | Status: AC
  Administered 2014-05-04: 30 mL via ORAL
  Filled 2014-05-04: qty 30

## 2014-05-04 MED ORDER — CIPROFLOXACIN IN D5W 400 MG/200ML IV SOLN
400.0000 mg | Freq: Once | INTRAVENOUS | Status: DC
  Filled 2014-05-04: qty 200

## 2014-05-04 MED ORDER — HYDROMORPHONE HCL 1 MG/ML IJ SOLN: 0.5000 mg | INTRAMUSCULAR | Status: DC | PRN

## 2014-05-04 MED ORDER — ALUM & MAG HYDROXIDE-SIMETH 200-200-20 MG/5ML PO SUSP: 30.0000 mL | Freq: Four times a day (QID) | ORAL | Status: DC | PRN

## 2014-05-04 MED ORDER — PANTOPRAZOLE SODIUM 40 MG PO TBEC
40.0000 mg | DELAYED_RELEASE_TABLET | Freq: Every day | ORAL | Status: DC
  Administered 2014-05-04 – 2014-05-05 (×2): 40 mg via ORAL
  Filled 2014-05-04 (×2): qty 1

## 2014-05-04 NOTE — Progress Notes (Signed)
Patient Demographics  Rachel Alexander, is a 34 y.o. female, DOB - 07/16/1980, JOI:[id number]  Admit date - 05/03/2014   Admitting Physician Theressa Millard, MD  Outpatient Primary MD for the patient is Linus Mako, NP  LOS - 1   Chief Complaint  Patient presents with  . Abdominal Pain  . Flank Pain  . Nausea      Admission history of present illness/brief narrative: Rachel Alexander is a 33 y.o. female who presents to the ED with complaints of ABD and Bilateral Flank Pain x 7 days, which has worsened over the past 2 days. She reports having chills, and denies nausea, vomiting and diarrhea. She was evaluated in the ED 2 days ago for possible kidney stones and had an US performed which was negative. A CT scan of the ABD revealed ascending colon Colitis and she was started on IV Cipro and Flagyl and referred for medical admission.    Subjective:   Rachel Alexander today has, No headache, No chest pain, - No Nausea, No new weakness tingling or numbness, No Cough - SOB. Complaints of abdominal pain and constipation.  Assessment & Plan    Principal Problem:   Colitis Active Problems:   Hypothyroidism  Ascending colon colitis -Surgical consult appreciated, no clinical evidence of appendicitis. -Continue with IV Cipro and IV Flagyl -Continue with when necessary pain and nausea medicine -continue with clear liquid diet, advance as tolerated.  Constipation -Will start on bowel regimen  Hypothyroidism -Continue with Synthroid  Code Status: full  Family Communication: family at bedside, patient is alert and oriented  Disposition Plan: home in 24-48 hours   Procedures  none   Consults   surgery   Medications  Scheduled Meds: . antiseptic oral rinse  7 mL Mouth Rinse q12n4p  . chlorhexidine  15 mL Mouth Rinse BID  . ciprofloxacin  400 mg  Intravenous Once  . enoxaparin (LOVENOX) injection  40 mg Subcutaneous Q24H  . FLUoxetine  40 mg Oral Daily  . levothyroxine  175 mcg Oral QAC breakfast  . pantoprazole  40 mg Oral Daily  . [START ON 05/05/2014] pneumococcal 23 valent vaccine  0.5 mL Intramuscular Tomorrow-1000  . polyethylene glycol  17 g Oral Daily  . sucralfate  1 g Oral BID WC   Continuous Infusions: . sodium chloride     PRN Meds:.acetaminophen **OR** acetaminophen, ALPRAZolam, alum & mag hydroxide-simeth, HYDROmorphone (DILAUDID) injection, ondansetron **OR** ondansetron (ZOFRAN) IV, oxyCODONE  DVT Prophylaxis  Lovenox  Lab Results  Component Value Date   PLT 186 05/04/2014    Antibiotics     Anti-infectives    Start     Dose/Rate Route Frequency Ordered Stop   05/04/14 0315  ciprofloxacin (CIPRO) IVPB 400 mg     400 mg200 mL/hr over 60 Minutes Intravenous  Once 05/04/14 0312     05/04/14 0315  metroNIDAZOLE (FLAGYL) IVPB 500 mg     500 mg100 mL/hr over 60 Minutes Intravenous  Once 05/04/14 0312 05/04/14 0452          Objective:   Filed Vitals:   05/04/14 0358 05/04/14 0400 05/04/14 0913 05/04/14 1331  BP: 96/63 101/64 92/58 98/59   Pulse: 89 84 82 92  Temp: 97.9 F (36.6 C)  97.8 F (36.6 C) 98 F (36.7 C)  TempSrc: Oral  Oral Oral  Resp: 16  16 16   Height:      Weight:      SpO2: 96% 96% 98% 95%    Wt Readings from Last 3 Encounters:  05/03/14 61.689 kg (136 lb)  03/20/14 58.968 kg (130 lb)     Intake/Output Summary (Last 24 hours) at 05/04/14 1347 Last data filed at 05/04/14 0915  Gross per 24 hour  Intake    860 ml  Output    250 ml  Net    610 ml     Physical Exam  Awake Alert, Oriented X 3, No new F.N deficits, Normal affect Rachel Alexander,PERRAL Supple Neck,No JVD, No cervical lymphadenopathy appriciated.  Symmetrical Chest wall movement, Good air movement bilaterally, CTAB RRR,No Gallops,Rubs or new Murmurs, No Parasternal Heave +ve B.Sounds, Abd Soft, mild tenderness in  right abdomen , No organomegaly appriciated, No rebound - guarding or rigidity. No Cyanosis, Clubbing or edema, No new Rash or bruise     Data Review   Micro Results No results found for this or any previous visit (from the past 240 hour(s)).  Radiology Reports Ct Renal Stone Study  05/04/2014   CLINICAL DATA:  Bilateral back pain and mid upper abdominal pain for 7 days.  EXAM: CT ABDOMEN AND PELVIS WITHOUT CONTRAST  TECHNIQUE: Multidetector CT imaging of the abdomen and pelvis was performed following the standard protocol without IV contrast.  COMPARISON:  Ultrasound abdomen 05/02/2014. Abdominal radiographs 03/20/2014.  FINDINGS: Mild dependent atelectasis in the lung bases. The kidneys appear symmetrical in size and shape. No renal, ureteral, or bladder stones. No hydronephrosis or hydroureter. No bladder wall thickening.  The unenhanced appearance of the liver, spleen, gallbladder, pancreas, adrenal glands, abdominal aorta, inferior vena cava, and retroperitoneal lymph nodes is unremarkable. Stomach and small bowel are decompressed. Stool-filled colon without distention. There is wall thickening of the ascending colon with pericolonic infiltration most likely representing colitis. This is likely to be infectious or inflammatory in origin. The appendix is not specifically identified and therefore appendicitis with reactive inflammation of the colon is not excluded. No pericolonic fluid collection to suggest abscess. Prominent right lower quadrant and mesenteric lymph nodes without pathologic enlargement are likely reactive. No free air in the abdomen.  Pelvis: Uterus and ovaries are not enlarged. Small amount of free fluid in the pelvis is likely reactive. No pelvic mass or lymphadenopathy.  IMPRESSION: No renal or ureteral stone or obstruction. Thickening of the wall of the ascending colon with pericolonic infiltration and reactive lymph nodes likely representing colitis although the appendix is not  specifically identified and therefore appendicitis is not entirely excluded. Small amount of free fluid in the pelvis is likely reactive.   Electronically Signed   By: Lucienne Capers M.D.   On: 05/04/2014 01:11    CBC  Recent Labs Lab 05/01/14 2136 05/03/14 2043 05/04/14 0532  WBC 7.0 12.0* 6.6  HGB 11.9* 11.7* 10.2*  HCT 35.5* 35.1* 31.0*  PLT 232 286 186  MCV 101.1* 98.9 101.0*  MCH 33.9 33.0 33.2  MCHC 33.5 33.3 32.9  RDW 12.6 12.3 12.6  LYMPHSABS 2.5 2.5  --   MONOABS 0.7 1.2*  --   EOSABS 0.1 0.1  --   BASOSABS 0.0 0.1  --     Chemistries   Recent Labs Lab 05/01/14 2136 05/03/14 2043 05/04/14 0532  NA 140 136* 140  K 3.8 3.9 3.6*  CL 101 98  105  CO2 25 24 25   GLUCOSE 91 107* 87  BUN 6 13 8   CREATININE 0.67 0.62 0.60  CALCIUM 9.1 9.1 7.6*  AST 14 13  --   ALT 13 11  --   ALKPHOS 72 72  --   BILITOT <0.2* <0.2*  --    ------------------------------------------------------------------------------------------------------------------ estimated creatinine clearance is 82 mL/min (by C-G formula based on Cr of 0.6). ------------------------------------------------------------------------------------------------------------------ No results for input(s): HGBA1C in the last 72 hours. ------------------------------------------------------------------------------------------------------------------ No results for input(s): CHOL, HDL, LDLCALC, TRIG, CHOLHDL, LDLDIRECT in the last 72 hours. ------------------------------------------------------------------------------------------------------------------ No results for input(s): TSH, T4TOTAL, T3FREE, THYROIDAB in the last 72 hours.  Invalid input(s): FREET3 ------------------------------------------------------------------------------------------------------------------ No results for input(s): VITAMINB12, FOLATE, FERRITIN, TIBC, IRON, RETICCTPCT in the last 72 hours.  Coagulation profile No results for input(s):  INR, PROTIME in the last 168 hours.  No results for input(s): DDIMER in the last 72 hours.  Cardiac Enzymes  Recent Labs Lab 05/01/14 2136  TROPONINI <0.30   ------------------------------------------------------------------------------------------------------------------ Invalid input(s): POCBNP     Time Spent in minutes   25 minutes   Tamala Manzer M.D on 05/04/2014 at 1:47 PM  Between 7am to 7pm - Pager - [phone]  After 7pm go to [email or web address] - password [email or web address]  And look for the night coverage person covering for me after hours  [facility] Office  [phone]   **Disclaimer: This note may have been dictated with voice recognition software. Similar sounding words can inadvertently be transcribed and this note may contain transcription errors which may not have been corrected upon publication of note.**

## 2014-05-04 NOTE — Consult Note (Signed)
Reason for Consult:abdominal pain 1 month and abnormal CT Referring Physician: Juleen China MD  Rachel Alexander is an 33 y.o. female.  HPI: Asked to see pt at request of Dr Randal Buba for back and abdominal pain.  One month ago pt seen in ED for epigastric abdominal pain.  This comes and goes.  Sharp and had bloating.  The pain was epigastric and in her back at times.  The pain went away and she was told she may have an ulcer.  She returns to ED this time with bilateral flank pain and blood in urine.  Stone survey negative for stone byt CT shoed inflamed ascending colon and high stool burden.  She has constipation issues and tries Miralax for that. Appendix could not be seen. Pt denies any RLQ pain at this point.  NO hx of colitis except in her brother.  No blood in stool or nausea vomiting.   Past Medical History  Diagnosis Date  . Suicidal intent   . Hypothyroidism   . Pituitary tumor     Past Surgical History  Procedure Laterality Date  . Tubal ligation    . Tonsillectomy      History reviewed. No pertinent family history.  Social History:  reports that she has been smoking Cigarettes.  She has been smoking about 1.00 pack per day. She does not have any smokeless tobacco history on file. She reports that she does not drink alcohol or use illicit drugs.  Allergies: No Known Allergies  Medications: I have reviewed the patient's current medications.  Results for orders placed or performed during the hospital encounter of 05/03/14 (from the past 48 hour(s))  CBC with Differential     Status: Abnormal   Collection Time: 05/03/14  8:43 PM  Result Value Ref Range   WBC 12.0 (H) 4.0 - 10.5 K/uL   RBC 3.55 (L) 3.87 - 5.11 MIL/uL   Hemoglobin 11.7 (L) 12.0 - 15.0 g/dL   HCT 35.1 (L) 36.0 - 46.0 %   MCV 98.9 78.0 - 100.0 fL   MCH 33.0 26.0 - 34.0 pg   MCHC 33.3 30.0 - 36.0 g/dL   RDW 12.3 11.5 - 15.5 %   Platelets 286 150 - 400 K/uL   Neutrophils Relative % 68 43 - 77 %   Neutro Abs 8.3  (H) 1.7 - 7.7 K/uL   Lymphocytes Relative 21 12 - 46 %   Lymphs Abs 2.5 0.7 - 4.0 K/uL   Monocytes Relative 10 3 - 12 %   Monocytes Absolute 1.2 (H) 0.1 - 1.0 K/uL   Eosinophils Relative 1 0 - 5 %   Eosinophils Absolute 0.1 0.0 - 0.7 K/uL   Basophils Relative 0 0 - 1 %   Basophils Absolute 0.1 0.0 - 0.1 K/uL  Comprehensive metabolic panel     Status: Abnormal   Collection Time: 05/03/14  8:43 PM  Result Value Ref Range   Sodium 136 (L) 137 - 147 mEq/L   Potassium 3.9 3.7 - 5.3 mEq/L   Chloride 98 96 - 112 mEq/L   CO2 24 19 - 32 mEq/L   Glucose, Bld 107 (H) 70 - 99 mg/dL   BUN 13 6 - 23 mg/dL   Creatinine, Ser 0.62 0.50 - 1.10 mg/dL   Calcium 9.1 8.4 - 10.5 mg/dL   Total Protein 6.7 6.0 - 8.3 g/dL   Albumin 3.3 (L) 3.5 - 5.2 g/dL   AST 13 0 - 37 U/L   ALT 11 0 - 35  U/L   Alkaline Phosphatase 72 39 - 117 U/L   Total Bilirubin <0.2 (L) 0.3 - 1.2 mg/dL   GFR calc non Af Amer >90 >90 mL/min   GFR calc Af Amer >90 >90 mL/min    Comment: (NOTE) The eGFR has been calculated using the CKD EPI equation. This calculation has not been validated in all clinical situations. eGFR's persistently <90 mL/min signify possible Chronic Kidney Disease.    Anion gap 14 5 - 15  Lipase, blood     Status: None   Collection Time: 05/03/14  8:43 PM  Result Value Ref Range   Lipase 18 11 - 59 U/L  Urinalysis, Routine w reflex microscopic     Status: Abnormal   Collection Time: 05/03/14  8:45 PM  Result Value Ref Range   Color, Urine YELLOW YELLOW   APPearance CLOUDY (A) CLEAR   Specific Gravity, Urine 1.017 1.005 - 1.030   pH 7.5 5.0 - 8.0   Glucose, UA NEGATIVE NEGATIVE mg/dL   Hgb urine dipstick NEGATIVE NEGATIVE   Bilirubin Urine NEGATIVE NEGATIVE   Ketones, ur NEGATIVE NEGATIVE mg/dL   Protein, ur NEGATIVE NEGATIVE mg/dL   Urobilinogen, UA 1.0 0.0 - 1.0 mg/dL   Nitrite NEGATIVE NEGATIVE   Leukocytes, UA TRACE (A) NEGATIVE  Urine microscopic-add on     Status: Abnormal   Collection Time:  05/03/14  8:45 PM  Result Value Ref Range   Squamous Epithelial / LPF FEW (A) RARE   WBC, UA 0-2 <3 WBC/hpf   RBC / HPF 3-6 <3 RBC/hpf   Bacteria, UA RARE RARE    Ct Renal Stone Study  05/04/2014   CLINICAL DATA:  Bilateral back pain and mid upper abdominal pain for 7 days.  EXAM: CT ABDOMEN AND PELVIS WITHOUT CONTRAST  TECHNIQUE: Multidetector CT imaging of the abdomen and pelvis was performed following the standard protocol without IV contrast.  COMPARISON:  Ultrasound abdomen 05/02/2014. Abdominal radiographs 03/20/2014.  FINDINGS: Mild dependent atelectasis in the lung bases. The kidneys appear symmetrical in size and shape. No renal, ureteral, or bladder stones. No hydronephrosis or hydroureter. No bladder wall thickening.  The unenhanced appearance of the liver, spleen, gallbladder, pancreas, adrenal glands, abdominal aorta, inferior vena cava, and retroperitoneal lymph nodes is unremarkable. Stomach and small bowel are decompressed. Stool-filled colon without distention. There is wall thickening of the ascending colon with pericolonic infiltration most likely representing colitis. This is likely to be infectious or inflammatory in origin. The appendix is not specifically identified and therefore appendicitis with reactive inflammation of the colon is not excluded. No pericolonic fluid collection to suggest abscess. Prominent right lower quadrant and mesenteric lymph nodes without pathologic enlargement are likely reactive. No free air in the abdomen.  Pelvis: Uterus and ovaries are not enlarged. Small amount of free fluid in the pelvis is likely reactive. No pelvic mass or lymphadenopathy.  IMPRESSION: No renal or ureteral stone or obstruction. Thickening of the wall of the ascending colon with pericolonic infiltration and reactive lymph nodes likely representing colitis although the appendix is not specifically identified and therefore appendicitis is not entirely excluded. Small amount of free  fluid in the pelvis is likely reactive.   Electronically Signed   By: Lucienne Capers M.D.   On: 05/04/2014 01:11    Review of Systems  Constitutional: Negative for fever, chills and weight loss.  HENT: Negative.   Respiratory: Negative.   Cardiovascular: Negative.   Gastrointestinal: Positive for abdominal pain and constipation. Negative for blood  in stool.  Genitourinary: Positive for hematuria and flank pain. Negative for dysuria.  Musculoskeletal: Negative.   Skin: Negative.   Endo/Heme/Allergies: Negative.   Psychiatric/Behavioral: Negative.    Blood pressure 90/50, pulse 75, temperature 98.6 F (37 C), temperature source Oral, resp. rate 16, height _0  (1.6 m), weight 136 lb (61.689 kg), last menstrual period 04/16/2014, SpO2 98 %. Physical Exam  Constitutional: She is oriented to person, place, and time. She appears well-developed and well-nourished.  HENT:  Head: Normocephalic and atraumatic.  Eyes: Pupils are equal, round, and reactive to light. No scleral icterus.  Neck: Normal range of motion.  Cardiovascular: Normal rate and regular rhythm.   Respiratory: Effort normal and breath sounds normal. No respiratory distress.  GI: She exhibits distension. She exhibits no mass. There is no tenderness. There is no rebound and no guarding.  Musculoskeletal: Normal range of motion.  Neurological: She is alert and oriented to person, place, and time.  Skin: Skin is warm and dry.  Psychiatric: She has a normal mood and affect. Her behavior is normal. Judgment and thought content normal.    Assessment/Plan: Constipation Colitis  ascending colon   -    exam very benign without significant pain when I palpate.  Would treat for colitis. The history nor exam support appendicitis.   Needs to see GI medicine at some point.  May need colonoscopy.   Tiana Sivertson A. 05/04/2014, 3:33 AM

## 2014-05-04 NOTE — Progress Notes (Signed)
Patient ID: Rachel Alexander, female   DOB: 1981-01-27, 33 y.o.   MRN: [id number]    Subjective: Pt's pain is better this am, but still located mostly in her epigastrium  Objective: Vital signs in last 24 hours: Temp:  [97.9 F (36.6 C)-98.6 F (37 C)] 97.9 F (36.6 C) (11/16 0358) Pulse Rate:  [72-97] 84 (11/16 0400) Resp:  [16] 16 (11/16 0358) BP: (90-126)/(50-79) 101/64 mmHg (11/16 0400) SpO2:  [96 %-99 %] 96 % (11/16 0400) Weight:  [136 lb (61.689 kg)] 136 lb (61.689 kg) (11/15 2037) Last BM Date: 05/03/14  Intake/Output from previous day: 11/15 0701 - 11/16 0700 In: 500 [I.V.:500] Out: -  Intake/Output this shift:    PE: Abd: soft, minimally tender, +BS, ND  Lab Results:   Recent Labs  05/03/14 2043 05/04/14 0532  WBC 12.0* 6.6  HGB 11.7* 10.2*  HCT 35.1* 31.0*  PLT 286 186   BMET  Recent Labs  05/03/14 2043 05/04/14 0532  NA 136* 140  K 3.9 3.6*  CL 98 105  CO2 24 25  GLUCOSE 107* 87  BUN 13 8  CREATININE 0.62 0.60  CALCIUM 9.1 7.6*   PT/INR No results for input(s): LABPROT, INR in the last 72 hours. CMP     Component Value Date/Time   NA 140 05/04/2014 0532   K 3.6* 05/04/2014 0532   CL 105 05/04/2014 0532   CO2 25 05/04/2014 0532   GLUCOSE 87 05/04/2014 0532   BUN 8 05/04/2014 0532   CREATININE 0.60 05/04/2014 0532   CALCIUM 7.6* 05/04/2014 0532   PROT 6.7 05/03/2014 2043   ALBUMIN 3.3* 05/03/2014 2043   AST 13 05/03/2014 2043   ALT 11 05/03/2014 2043   ALKPHOS 72 05/03/2014 2043   BILITOT <0.2* 05/03/2014 2043   GFRNONAA >90 05/04/2014 0532   GFRAA >90 05/04/2014 0532   Lipase     Component Value Date/Time   LIPASE 18 05/03/2014 2043       Studies/Results: Ct Renal Stone Study  05/04/2014   CLINICAL DATA:  Bilateral back pain and mid upper abdominal pain for 7 days.  EXAM: CT ABDOMEN AND PELVIS WITHOUT CONTRAST  TECHNIQUE: Multidetector CT imaging of the abdomen and pelvis was performed following the standard protocol without  IV contrast.  COMPARISON:  Ultrasound abdomen 05/02/2014. Abdominal radiographs 03/20/2014.  FINDINGS: Mild dependent atelectasis in the lung bases. The kidneys appear symmetrical in size and shape. No renal, ureteral, or bladder stones. No hydronephrosis or hydroureter. No bladder wall thickening.  The unenhanced appearance of the liver, spleen, gallbladder, pancreas, adrenal glands, abdominal aorta, inferior vena cava, and retroperitoneal lymph nodes is unremarkable. Stomach and small bowel are decompressed. Stool-filled colon without distention. There is wall thickening of the ascending colon with pericolonic infiltration most likely representing colitis. This is likely to be infectious or inflammatory in origin. The appendix is not specifically identified and therefore appendicitis with reactive inflammation of the colon is not excluded. No pericolonic fluid collection to suggest abscess. Prominent right lower quadrant and mesenteric lymph nodes without pathologic enlargement are likely reactive. No free air in the abdomen.  Pelvis: Uterus and ovaries are not enlarged. Small amount of free fluid in the pelvis is likely reactive. No pelvic mass or lymphadenopathy.  IMPRESSION: No renal or ureteral stone or obstruction. Thickening of the wall of the ascending colon with pericolonic infiltration and reactive lymph nodes likely representing colitis although the appendix is not specifically identified and therefore appendicitis is not entirely excluded. Small amount  of free fluid in the pelvis is likely reactive.   Electronically Signed   By: Lucienne Capers M.D.   On: 05/04/2014 01:11    Anti-infectives: Anti-infectives    Start     Dose/Rate Route Frequency Ordered Stop   05/04/14 0315  ciprofloxacin (CIPRO) IVPB 400 mg     400 mg200 mL/hr over 60 Minutes Intravenous  Once 05/04/14 0312     05/04/14 0315  metroNIDAZOLE (FLAGYL) IVPB 500 mg     500 mg100 mL/hr over 60 Minutes Intravenous  Once 05/04/14  0312 05/04/14 0452       Assessment/Plan  1. Constipation, significant 2. Colitis of ascending colon  Plan: 1. Will start on miralax and give her a one time fleet enema to try and get some stool moving.  This may help relieve some of her discomfort.   2. Cont abx therapy and management per primary service.  No evidence of appendicitis.   LOS: 1 day    Rachel Alexander E 05/04/2014, 8:15 AM Pager: [phone]

## 2014-05-04 NOTE — ED Notes (Signed)
Pt returned from CT. Pt in no apparent distress

## 2014-05-04 NOTE — H&P (Signed)
[facility] Admission History and Physical       Twila Rappa ZOX:[id number] DOB: 08-04-1980 DOA: 05/03/2014  Referring physician:  PCP: Linus Mako, NP  Specialists:   Chief Complaint:  ABD Pain  HPI: Rachel Alexander is a 33 y.o. female who presents to the ED with complaints of ABD and Bilateral Flank Pain x 7 days, which has worsened over the past 2 days.   She reports having chills, and denies nausea, vomiting and diarrhea.    She was evaluated in the ED 2 days ago for possible kidney stones and had an US performed which was negative.    A CT scan of the ABD revealed ascending colon Colitis and she was started on IV Cipro and Flagyl and referred for medical admission.     Review of Systems:  Constitutional: No Weight Loss, No Weight Gain, Night Sweats, Fevers, +Chills, Dizziness, Fatigue, or Generalized Weakness HEENT: No Headaches, Difficulty Swallowing,Tooth/Dental Problems,Sore Throat,  No Sneezing, Rhinitis, Ear Ache, Nasal Congestion, or Post Nasal Drip,  Cardio-vascular:  No Chest pain, Orthopnea, PND, Edema in Lower Extremities, Anasarca, Dizziness, Palpitations  Resp: No Dyspnea, No DOE, No Productive Cough, No Non-Productive Cough, No Hemoptysis, No Wheezing.    GI: No Heartburn, Indigestion, +Abdominal Pain, Nausea, Vomiting, Diarrhea, Hematemesis, Hematochezia, Melena, Change in Bowel Habits,  Loss of Appetite  GU: No Dysuria, Change in Color of Urine, No Urgency or Frequency, +Flank pain.  Musculoskeletal: No Joint Pain or Swelling, No Decreased Range of Motion, No Back Pain.  Neurologic: No Syncope, No Seizures, Muscle Weakness, Paresthesia, Vision Disturbance or Loss, No Diplopia, No Vertigo, No Difficulty Walking,  Skin: No Rash or Lesions. Psych: No Change in Mood or Affect, No Depression or Anxiety, No Memory loss, No Confusion, or Hallucinations   Past Medical History  Diagnosis Date  . Suicidal intent   . Hypothyroidism   . Pituitary tumor        Past Surgical History  Procedure Laterality Date  . Tubal ligation    . Tonsillectomy         Prior to Admission medications   Medication Sig Start Date End Date Taking? Authorizing Provider  ALPRAZolam Duanne Moron) 0.5 MG tablet Take 0.5 mg by mouth 3 (three) times daily as needed for anxiety.   Yes Historical Provider, MD  aspirin-acetaminophen-caffeine (EXCEDRIN MIGRAINE) [phone] MG per tablet Take 2 tablets by mouth every 6 (six) hours as needed for headache or migraine.   Yes Historical Provider, MD  FLUoxetine (PROZAC) 40 MG capsule Take 40 mg by mouth daily.   Yes Historical Provider, MD  levothyroxine (SYNTHROID, LEVOTHROID) 175 MCG tablet Take 175 mcg by mouth daily.     Yes Historical Provider, MD  omeprazole (PRILOSEC) 20 MG capsule Take 1 capsule (20 mg total) by mouth 2 (two) times daily before a meal. 03/20/14  Yes Courtney A Forcucci, PA-C  ranitidine (ZANTAC) 150 MG tablet Take 1 tablet (150 mg total) by mouth at bedtime. 03/20/14  Yes Courtney A Forcucci, PA-C  sucralfate (CARAFATE) 1 G tablet Take 1 tablet (1 g total) by mouth 2 (two) times daily with a meal. 05/02/14  Yes Jennifer L Piepenbrink, PA-C  HYDROcodone-acetaminophen (NORCO/VICODIN) 5-325 MG per tablet Take 2 tablets by mouth every 4 (four) hours as needed for moderate pain or severe pain. 03/20/14   Courtney A Forcucci, PA-C      No Known Allergies   Social History:  reports that she has been smoking Cigarettes.  She has been smoking about  1.00 pack per day. She does not have any smokeless tobacco history on file. She reports that she does not drink alcohol or use illicit drugs.     History reviewed. No pertinent family history.     Physical Exam:  GEN:  [place] Well Nourished and Well Developed  33 y.o. Caucasian female examined and in no acute distress; cooperative with exam Filed Vitals:   05/04/14 0100 05/04/14 0130 05/04/14 0358 05/04/14 0400  BP: 93/59 90/50 96/63  101/64  Pulse: 72 75 89  84  Temp:   97.9 F (36.6 C)   TempSrc:   Oral   Resp:   16   Height:      Weight:      SpO2: 98% 98% 96% 96%   Blood pressure 101/64, pulse 84, temperature 97.9 F (36.6 C), temperature source Oral, resp. rate 16, height 5\' 3"  (1.6 m), weight 61.689 kg (136 lb), last menstrual period 04/16/2014, SpO2 96 %. PSYCH: She is alert and oriented x4; does not appear anxious does not appear depressed; affect is normal HEENT: Normocephalic and Atraumatic, Mucous membranes pink; PERRLA; EOM intact; Fundi:  Benign;  No scleral icterus, Nares: Patent, Oropharynx: Clear, Fair Dentition,    Neck:  FROM, No Cervical Lymphadenopathy nor Thyromegaly or Carotid Bruit; No JVD; Breasts:: Not examined CHEST WALL: No tenderness CHEST: Normal respiration, clear to auscultation bilaterally HEART: Regular rate and rhythm; no murmurs rubs or gallops BACK: No kyphosis or scoliosis; No CVA tenderness ABDOMEN: Positive Bowel Sounds, Obese, Soft Non-Tender; No Masses, No Organomegaly, . Rectal Exam: Not done EXTREMITIES: No Cyanosis, Clubbing, or Edema; No Ulcerations. Genitalia: not examined PULSES: 2+ and symmetric SKIN: Normal hydration no rash or ulceration CNS:  Alert and Oriented x 4, No Focal Deficits  Vascular: pulses palpable throughout    Labs on Admission:  Basic Metabolic Panel:  Recent Labs Lab 05/01/14 2136 05/03/14 2043  NA 140 136*  K 3.8 3.9  CL 101 98  CO2 25 24  GLUCOSE 91 107*  BUN 6 13  CREATININE 0.67 0.62  CALCIUM 9.1 9.1   Liver Function Tests:  Recent Labs Lab 05/01/14 2136 05/03/14 2043  AST 14 13  ALT 13 11  ALKPHOS 72 72  BILITOT <0.2* <0.2*  PROT 6.9 6.7  ALBUMIN 3.3* 3.3*    Recent Labs Lab 05/01/14 2136 05/03/14 2043  LIPASE 18 18   No results for input(s): AMMONIA in the last 168 hours. CBC:  Recent Labs Lab 05/01/14 2136 05/03/14 2043  WBC 7.0 12.0*  NEUTROABS 3.7 8.3*  HGB 11.9* 11.7*  HCT 35.5* 35.1*  MCV 101.1* 98.9  PLT 232 286    Cardiac Enzymes:  Recent Labs Lab 05/01/14 2136  TROPONINI <0.30    BNP (last 3 results) No results for input(s): PROBNP in the last 8760 hours. CBG: No results for input(s): GLUCAP in the last 168 hours.  Radiological Exams on Admission: Ct Renal Stone Study  05/04/2014   CLINICAL DATA:  Bilateral back pain and mid upper abdominal pain for 7 days.  EXAM: CT ABDOMEN AND PELVIS WITHOUT CONTRAST  TECHNIQUE: Multidetector CT imaging of the abdomen and pelvis was performed following the standard protocol without IV contrast.  COMPARISON:  Ultrasound abdomen 05/02/2014. Abdominal radiographs 03/20/2014.  FINDINGS: Mild dependent atelectasis in the lung bases. The kidneys appear symmetrical in size and shape. No renal, ureteral, or bladder stones. No hydronephrosis or hydroureter. No bladder wall thickening.  The unenhanced appearance of the liver, spleen, gallbladder, pancreas, adrenal glands,  abdominal aorta, inferior vena cava, and retroperitoneal lymph nodes is unremarkable. Stomach and small bowel are decompressed. Stool-filled colon without distention. There is wall thickening of the ascending colon with pericolonic infiltration most likely representing colitis. This is likely to be infectious or inflammatory in origin. The appendix is not specifically identified and therefore appendicitis with reactive inflammation of the colon is not excluded. No pericolonic fluid collection to suggest abscess. Prominent right lower quadrant and mesenteric lymph nodes without pathologic enlargement are likely reactive. No free air in the abdomen.  Pelvis: Uterus and ovaries are not enlarged. Small amount of free fluid in the pelvis is likely reactive. No pelvic mass or lymphadenopathy.  IMPRESSION: No renal or ureteral stone or obstruction. Thickening of the wall of the ascending colon with pericolonic infiltration and reactive lymph nodes likely representing colitis although the appendix is not specifically  identified and therefore appendicitis is not entirely excluded. Small amount of free fluid in the pelvis is likely reactive.   Electronically Signed   By: Lucienne Capers M.D.   On: 05/04/2014 01:11       Assessment/Plan:   33 y.o. female with   Principal Problem:   1.   Colitis   IV Cipro   IV Flagyl   IVFs   Pain Control PRN  Active Problems:   2.   Hypothyroidism   Continue Levothyroxine    3.    DVT Prophylaxis   Lovenox      Code Status:      FULL CODE Family Communication:   No Family Present  Disposition Plan:    Inpatient     Time spent:  [place] [facility] Pager [phone]   If [place] Please Contact the Day Rounding Team MD for [facility]  If 7PM-7AM, Please Contact Night-Floor Coverage  [email or web address] Password [email or web address] 05/04/2014, 4:28 AM

## 2014-05-04 NOTE — ED Notes (Signed)
Patient transported to CT 

## 2014-05-05 ENCOUNTER — Encounter (HOSPITAL_COMMUNITY): Payer: Self-pay

## 2014-05-05 LAB — BASIC METABOLIC PANEL
Anion gap: 13 (ref 5–15)
BUN: 3 mg/dL — AB (ref 6–23)
CALCIUM: 8.4 mg/dL (ref 8.4–10.5)
CO2: 23 mEq/L (ref 19–32)
Chloride: 104 mEq/L (ref 96–112)
Creatinine, Ser: 0.55 mg/dL (ref 0.50–1.10)
GFR calc Af Amer: >90 mL/min (ref >90)
GLUCOSE: 77 mg/dL (ref 70–99)
Potassium: 3.9 mEq/L (ref 3.7–5.3)
Sodium: 140 mEq/L (ref 137–147)

## 2014-05-05 LAB — FECAL LACTOFERRIN, QUANT: Fecal Lactoferrin: POSITIVE

## 2014-05-05 LAB — CBC
HEMATOCRIT: 34.7 % — AB (ref 36.0–46.0)
HEMOGLOBIN: 11.2 g/dL — AB (ref 12.0–15.0)
MCH: 31.6 pg (ref 26.0–34.0)
MCHC: 32.3 g/dL (ref 30.0–36.0)
MCV: 98 fL (ref 78.0–100.0)
Platelets: 215 10*3/uL (ref 150–400)
RBC: 3.54 MIL/uL — ABNORMAL LOW (ref 3.87–5.11)
RDW: 12.3 % (ref 11.5–15.5)
WBC: 6.7 10*3/uL (ref 4.0–10.5)

## 2014-05-05 MED ORDER — CIPROFLOXACIN HCL 500 MG PO TABS: 500.0000 mg | ORAL_TABLET | Freq: Two times a day (BID) | ORAL | Status: AC

## 2014-05-05 MED ORDER — METRONIDAZOLE 500 MG PO TABS: 500.0000 mg | ORAL_TABLET | Freq: Three times a day (TID) | ORAL | Status: AC

## 2014-05-05 MED ORDER — OXYCODONE HCL 5 MG PO TABS: 5.0000 mg | ORAL_TABLET | Freq: Four times a day (QID) | ORAL | Status: DC | PRN

## 2014-05-05 MED ORDER — MAGNESIUM CITRATE PO SOLN
0.5000 | Freq: Once | ORAL | Status: AC
  Administered 2014-05-05: 0.5 via ORAL
  Filled 2014-05-05: qty 296

## 2014-05-05 MED ORDER — LACTINEX PO CHEW: 1.0000 | CHEWABLE_TABLET | Freq: Three times a day (TID) | ORAL | Status: AC

## 2014-05-05 MED ORDER — POLYETHYLENE GLYCOL 3350 17 G PO PACK: 17.0000 g | PACK | Freq: Every day | ORAL | Status: DC

## 2014-05-05 NOTE — Discharge Instructions (Signed)
Follow with Primary MD Billie Ruddy I, NP in 7 days   Get CBC, CMP, 2 view Chest X ray,abdominal xray  checked  by Primary MD next visit.    Activity: As tolerated with Full fall precautions use walker/cane & assistance as needed   Disposition Home    Diet: Heart Healthy , with feeding assistance and aspiration precautions as needed.  For Heart failure patients - Check your Weight same time everyday, if you gain over 2 pounds, or you develop in leg swelling, experience more shortness of breath or chest pain, call your Primary MD immediately. Follow Cardiac Low Salt Diet and 1.8 lit/day fluid restriction.   On your next visit with your primary care physician please Get Medicines reviewed and adjusted.   Please request your Prim.MD to go over all Hospital Tests and Procedure/Radiological results at the follow up, please get all Hospital records sent to your Prim MD by signing hospital release before you go home.   If you experience worsening of your admission symptoms, develop shortness of breath, life threatening emergency, suicidal or homicidal thoughts you must seek medical attention immediately by calling 911 or calling your MD immediately  if symptoms less severe.  You Must read complete instructions/literature along with all the possible adverse reactions/side effects for all the Medicines you take and that have been prescribed to you. Take any new Medicines after you have completely understood and accpet all the possible adverse reactions/side effects.   Do not drive, operating heavy machinery, perform activities at heights, swimming or participation in water activities or provide baby sitting services if your were admitted for syncope or siezures until you have seen by Primary MD or a Neurologist and advised to do so again.  Do not drive when taking Pain medications.    Do not take more than prescribed Pain, Sleep and Anxiety Medications  Special Instructions: If you have  smoked or chewed Tobacco  in the last 2 yrs please stop smoking, stop any regular Alcohol  and or any Recreational drug use.  Wear Seat belts while driving.   Please note  You were cared for by a hospitalist during your hospital stay. If you have any questions about your discharge medications or the care you received while you were in the hospital after you are discharged, you can call the unit and asked to speak with the hospitalist on call if the hospitalist that took care of you is not available. Once you are discharged, your primary care physician will handle any further medical issues. Please note that NO REFILLS for any discharge medications will be authorized once you are discharged, as it is imperative that you return to your primary care physician (or establish a relationship with a primary care physician if you do not have one) for your aftercare needs so that they can reassess your need for medications and monitor your lab values.

## 2014-05-05 NOTE — Progress Notes (Signed)
Discharge instructions given to pt and pt's boyfriend. Pt asked appropriate questions which were answered. Rx's given and reviewed.

## 2014-05-05 NOTE — Discharge Summary (Signed)
Rachel Alexander, 33 y.o., DOB 20-Oct-1980, MRN [id number]. Admission date: 05/03/2014 Discharge Date 05/05/2014 Primary MD Linus Mako, NP Admitting Physician Theressa Millard, MD  Admission Diagnosis  Back pain [M54.9] Colitis [K52.9]  Discharge Diagnosis   Principal Problem:   Colitis Active Problems:   Hypothyroidism      Past Medical History  Diagnosis Date  . Suicidal intent   . Hypothyroidism   . Pituitary tumor   . Colitis     Past Surgical History  Procedure Laterality Date  . Tubal ligation    . Tonsillectomy     Admission history of present illness/brief narrative:  Rachel Alexander is a 33 y.o. female who presents to the ED with complaints of ABD and Bilateral Flank Pain x 7 days, which has worsened over the past 2 days Prior to admission. She reports having chills, and denies nausea, vomiting and diarrhea. She was evaluated in the ED 2 days preoperative admission for possible kidney stones and had an US performed which was negative. A CT scan of the ABD revealed ascending colon Colitis and she was started on IV Cipro and Flagyl and referred for medical admission, as well patient was seen by general surgery ruled out appendicitis, patient is been complaining of significant constipation which improved after receiving multiple laxatives during hospitalization, abdominal pain much improved, patient couldn't tolerate oral intake regular diet, afebrile with no leukocytosis.  Hospital Course See H&P, Labs, Consult and Test reports for all details in brief, patient was admitted for   Principal Problem:   Colitis Active Problems:   Hypothyroidism  Ascending colon colitis -Surgical consult appreciated, no clinical evidence of appendicitis. - received IV Cipro and IV Flagyl 2 days, to finish another 5 days as an outpatient. -patient did tolerate regular diet  Constipation -preserved after she was given good bowel regimen during inpatient, she was instructed to  continue with laxatives as needed as an outpatient, but mainly to focus on her increase dietary fiber intake.  Hypothyroidism -Continue with Synthroid  Consults   surgery  Significant Tests:  See full reports for all details    US Abdomen Complete  05/02/2014   CLINICAL DATA:  RIGHT upper quadrant pain.  EXAM: ULTRASOUND ABDOMEN COMPLETE  COMPARISON:  Abdominal radiographs March 20, 2014  FINDINGS: Gallbladder: No gallstones or wall thickening visualized. No sonographic Murphy sign noted.  Common bile duct: Diameter: 4 mm  Liver: No focal lesion identified. Within normal limits in parenchymal echogenicity.  IVC: No abnormality visualized.  Pancreas: Visualized portion unremarkable.  Spleen: Size and appearance within normal limits.  Right Kidney: Length: 9.8 cm. Echogenicity within normal limits. No mass or hydronephrosis visualized.  Left Kidney: Length: 10.2 cm. Echogenicity within normal limits. No mass or hydronephrosis visualized.  Abdominal aorta: No aneurysm visualized.  Other findings: None.  IMPRESSION: Normal abdominal sonogram.   Electronically Signed   By: Elon Alas   On: 05/02/2014 01:27   Ct Renal Stone Study  05/04/2014   CLINICAL DATA:  Bilateral back pain and mid upper abdominal pain for 7 days.  EXAM: CT ABDOMEN AND PELVIS WITHOUT CONTRAST  TECHNIQUE: Multidetector CT imaging of the abdomen and pelvis was performed following the standard protocol without IV contrast.  COMPARISON:  Ultrasound abdomen 05/02/2014. Abdominal radiographs 03/20/2014.  FINDINGS: Mild dependent atelectasis in the lung bases. The kidneys appear symmetrical in size and shape. No renal, ureteral, or bladder stones. No hydronephrosis or hydroureter. No bladder wall thickening.  The unenhanced appearance of the liver, spleen,  gallbladder, pancreas, adrenal glands, abdominal aorta, inferior vena cava, and retroperitoneal lymph nodes is unremarkable. Stomach and small bowel are decompressed. Stool-filled  colon without distention. There is wall thickening of the ascending colon with pericolonic infiltration most likely representing colitis. This is likely to be infectious or inflammatory in origin. The appendix is not specifically identified and therefore appendicitis with reactive inflammation of the colon is not excluded. No pericolonic fluid collection to suggest abscess. Prominent right lower quadrant and mesenteric lymph nodes without pathologic enlargement are likely reactive. No free air in the abdomen.  Pelvis: Uterus and ovaries are not enlarged. Small amount of free fluid in the pelvis is likely reactive. No pelvic mass or lymphadenopathy.  IMPRESSION: No renal or ureteral stone or obstruction. Thickening of the wall of the ascending colon with pericolonic infiltration and reactive lymph nodes likely representing colitis although the appendix is not specifically identified and therefore appendicitis is not entirely excluded. Small amount of free fluid in the pelvis is likely reactive.   Electronically Signed   By: Lucienne Capers M.D.   On: 05/04/2014 01:11     Today   Subjective:   Rachel Alexander today has no headache,no chest abdominal pain,no new weakness tingling or numbness, feels much better wants to go home today.  Objective:   Blood pressure 92/52, pulse 79, temperature 98.4 F (36.9 C), temperature source Oral, resp. rate 16, height 5\' 3"  (1.6 m), weight 61.689 kg (136 lb), last menstrual period 04/16/2014, SpO2 98 %.  Intake/Output Summary (Last 24 hours) at 05/05/14 1440 Last data filed at 05/04/14 2329  Gross per 24 hour  Intake    970 ml  Output    320 ml  Net    650 ml    Exam Awake Alert, Oriented *3, No new F.N deficits, Normal affect [place].AT,PERRAL Supple Neck,No JVD, No cervical lymphadenopathy appriciated.  Symmetrical Chest wall movement, Good air movement bilaterally, CTAB RRR,No Gallops,Rubs or new Murmurs, No Parasternal Heave +ve B.Sounds, Abd Soft, Non  tender, No organomegaly appriciated, No rebound -guarding or rigidity. No Cyanosis, Clubbing or edema, No new Rash or bruise  Data Review     CBC w Diff: Lab Results  Component Value Date   WBC 6.7 05/05/2014   HGB 11.2* 05/05/2014   HCT 34.7* 05/05/2014   PLT 215 05/05/2014   LYMPHOPCT 21 05/03/2014   MONOPCT 10 05/03/2014   EOSPCT 1 05/03/2014   BASOPCT 0 05/03/2014   CMP: Lab Results  Component Value Date   NA 140 05/05/2014   K 3.9 05/05/2014   CL 104 05/05/2014   CO2 23 05/05/2014   BUN 3* 05/05/2014   CREATININE 0.55 05/05/2014   PROT 6.7 05/03/2014   ALBUMIN 3.3* 05/03/2014   BILITOT <0.2* 05/03/2014   ALKPHOS 72 05/03/2014   AST 13 05/03/2014   ALT 11 05/03/2014  .  Micro Results No results found for this or any previous visit (from the past 240 hour(s)).   Discharge Instructions      Follow-up Information    Follow up with Billie Ruddy I, NP. Call in 1 week.   Specialty:  Nurse Practitioner   Contact information:   [address] [place] [address] [phone]       Discharge Medications     Medication List    STOP taking these medications        aspirin-acetaminophen-caffeine 250-250-65 MG per tablet  Commonly known as:  EXCEDRIN MIGRAINE     HYDROcodone-acetaminophen 5-325  MG per tablet  Commonly known as:  NORCO/VICODIN      TAKE these medications        ALPRAZolam 0.5 MG tablet  Commonly known as:  XANAX  Take 0.5 mg by mouth 3 (three) times daily as needed for anxiety.     ciprofloxacin 500 MG tablet  Commonly known as:  CIPRO  Take 1 tablet (500 mg total) by mouth 2 (two) times daily.     FLUoxetine 40 MG capsule  Commonly known as:  PROZAC  Take 40 mg by mouth daily.     lactobacillus acidophilus & bulgar chewable tablet  Chew 1 tablet by mouth 3 (three) times daily with meals.     levothyroxine 175 MCG tablet  Commonly known as:  SYNTHROID, LEVOTHROID  Take 175 mcg by mouth daily.      metroNIDAZOLE 500 MG tablet  Commonly known as:  FLAGYL  Take 1 tablet (500 mg total) by mouth 3 (three) times daily.     omeprazole 20 MG capsule  Commonly known as:  PRILOSEC  Take 1 capsule (20 mg total) by mouth 2 (two) times daily before a meal.     oxyCODONE 5 MG immediate release tablet  Commonly known as:  ROXICODONE  Take 1 tablet (5 mg total) by mouth every 6 (six) hours as needed for severe pain.     polyethylene glycol packet  Commonly known as:  MIRALAX  Take 17 g by mouth daily.     ranitidine 150 MG tablet  Commonly known as:  ZANTAC  Take 1 tablet (150 mg total) by mouth at bedtime.     sucralfate 1 G tablet  Commonly known as:  CARAFATE  Take 1 tablet (1 g total) by mouth 2 (two) times daily with a meal.         Total Time in preparing paper work, data evaluation and todays exam - 35 minutes  Maggy Wyble M.D on 05/05/2014 at 2:40 PM  [place]  [phone]

## 2014-08-06 ENCOUNTER — Encounter (HOSPITAL_COMMUNITY): Payer: Self-pay | Admitting: *Deleted

## 2014-08-06 ENCOUNTER — Emergency Department (HOSPITAL_COMMUNITY)
Admission: EM | Admit: 2014-08-06 | Discharge: 2014-08-06 | Disposition: A | Discharge status: Home / Self Care | Payer: Medicaid Other | Attending: Emergency Medicine | Admitting: Emergency Medicine

## 2014-08-06 DIAGNOSIS — Z720 Tobacco use: Secondary | ICD-10-CM | POA: Insufficient documentation

## 2014-08-06 DIAGNOSIS — E039 Hypothyroidism, unspecified: Secondary | ICD-10-CM | POA: Insufficient documentation

## 2014-08-06 DIAGNOSIS — Z3202 Encounter for pregnancy test, result negative: Secondary | ICD-10-CM | POA: Insufficient documentation

## 2014-08-06 DIAGNOSIS — Z9851 Tubal ligation status: Secondary | ICD-10-CM | POA: Insufficient documentation

## 2014-08-06 DIAGNOSIS — Z86018 Personal history of other benign neoplasm: Secondary | ICD-10-CM | POA: Insufficient documentation

## 2014-08-06 DIAGNOSIS — Z79899 Other long term (current) drug therapy: Secondary | ICD-10-CM | POA: Insufficient documentation

## 2014-08-06 DIAGNOSIS — R6883 Chills (without fever): Secondary | ICD-10-CM | POA: Insufficient documentation

## 2014-08-06 DIAGNOSIS — R197 Diarrhea, unspecified: Secondary | ICD-10-CM | POA: Insufficient documentation

## 2014-08-06 DIAGNOSIS — R1084 Generalized abdominal pain: Secondary | ICD-10-CM | POA: Insufficient documentation

## 2014-08-06 DIAGNOSIS — Z8719 Personal history of other diseases of the digestive system: Secondary | ICD-10-CM | POA: Insufficient documentation

## 2014-08-06 LAB — COMPREHENSIVE METABOLIC PANEL
ALK PHOS: 64 U/L (ref 39–117)
ALT: 27 U/L (ref 0–35)
AST: 26 U/L (ref 0–37)
Albumin: 4.4 g/dL (ref 3.5–5.2)
Anion gap: 7 (ref 5–15)
BUN: 11 mg/dL (ref 6–23)
CO2: 32 mmol/L (ref 19–32)
Calcium: 9.3 mg/dL (ref 8.4–10.5)
Chloride: 99 mmol/L (ref 96–112)
Creatinine, Ser: 0.86 mg/dL (ref 0.50–1.10)
GFR calc Af Amer: >90 mL/min (ref >90)
GFR, EST NON AFRICAN AMERICAN: 87 mL/min — AB (ref >90)
GLUCOSE: 93 mg/dL (ref 70–99)
POTASSIUM: 4.1 mmol/L (ref 3.5–5.1)
SODIUM: 138 mmol/L (ref 135–145)
Total Bilirubin: 0.3 mg/dL (ref 0.3–1.2)
Total Protein: 7.2 g/dL (ref 6.0–8.3)

## 2014-08-06 LAB — CBC WITH DIFFERENTIAL/PLATELET
BASOS ABS: 0 10*3/uL (ref 0.0–0.1)
Basophils Relative: 1 % (ref 0–1)
EOS PCT: 1 % (ref 0–5)
Eosinophils Absolute: 0.1 10*3/uL (ref 0.0–0.7)
HCT: 38 % (ref 36.0–46.0)
Hemoglobin: 12.4 g/dL (ref 12.0–15.0)
Lymphocytes Relative: 38 % (ref 12–46)
Lymphs Abs: 3.4 10*3/uL (ref 0.7–4.0)
MCH: 31.7 pg (ref 26.0–34.0)
MCHC: 32.6 g/dL (ref 30.0–36.0)
MCV: 97.2 fL (ref 78.0–100.0)
Monocytes Absolute: 0.8 10*3/uL (ref 0.1–1.0)
Monocytes Relative: 9 % (ref 3–12)
NEUTROS ABS: 4.6 10*3/uL (ref 1.7–7.7)
Neutrophils Relative %: 51 % (ref 43–77)
Platelets: 268 10*3/uL (ref 150–400)
RBC: 3.91 MIL/uL (ref 3.87–5.11)
RDW: 13.7 % (ref 11.5–15.5)
WBC: 8.9 10*3/uL (ref 4.0–10.5)

## 2014-08-06 LAB — URINALYSIS, ROUTINE W REFLEX MICROSCOPIC
Bilirubin Urine: NEGATIVE
GLUCOSE, UA: NEGATIVE mg/dL
HGB URINE DIPSTICK: NEGATIVE
Ketones, ur: 15 mg/dL — AB
LEUKOCYTES UA: NEGATIVE
Nitrite: NEGATIVE
PROTEIN: NEGATIVE mg/dL
SPECIFIC GRAVITY, URINE: 1.024 (ref 1.005–1.030)
UROBILINOGEN UA: 0.2 mg/dL (ref 0.0–1.0)
pH: 6.5 (ref 5.0–8.0)

## 2014-08-06 LAB — LIPASE, BLOOD: Lipase: 18 U/L (ref 11–59)

## 2014-08-06 LAB — PREGNANCY, URINE: Preg Test, Ur: NEGATIVE

## 2014-08-06 MED ORDER — METOCLOPRAMIDE HCL 5 MG/ML IJ SOLN
10.0000 mg | INTRAMUSCULAR | Status: AC
  Administered 2014-08-06: 10 mg via INTRAVENOUS
  Filled 2014-08-06: qty 2

## 2014-08-06 MED ORDER — DICYCLOMINE HCL 20 MG PO TABS: 20.0000 mg | ORAL_TABLET | Freq: Two times a day (BID) | ORAL | Status: DC

## 2014-08-06 MED ORDER — ONDANSETRON 4 MG PO TBDP
8.0000 mg | ORAL_TABLET | Freq: Once | ORAL | Status: AC
  Administered 2014-08-06: 8 mg via ORAL
  Filled 2014-08-06: qty 2

## 2014-08-06 MED ORDER — MORPHINE SULFATE 4 MG/ML IJ SOLN
2.0000 mg | Freq: Once | INTRAMUSCULAR | Status: AC
  Administered 2014-08-06: 2 mg via INTRAVENOUS
  Filled 2014-08-06: qty 1

## 2014-08-06 MED ORDER — OXYCODONE-ACETAMINOPHEN 5-325 MG PO TABS
1.0000 | ORAL_TABLET | Freq: Once | ORAL | Status: AC
  Administered 2014-08-06: 1 via ORAL
  Filled 2014-08-06: qty 1

## 2014-08-06 MED ORDER — DICYCLOMINE HCL 10 MG/ML IM SOLN
20.0000 mg | Freq: Once | INTRAMUSCULAR | Status: AC
  Administered 2014-08-06: 20 mg via INTRAMUSCULAR
  Filled 2014-08-06: qty 2

## 2014-08-06 MED ORDER — SODIUM CHLORIDE 0.9 % IV BOLUS (SEPSIS)
1000.0000 mL | Freq: Once | INTRAVENOUS | Status: AC
  Administered 2014-08-06: 1000 mL via INTRAVENOUS

## 2014-08-06 NOTE — ED Provider Notes (Signed)
CSN: [id number]     Arrival date & time 08/06/14  1824 History   First MD Initiated Contact with Patient 08/06/14 2028     Chief Complaint  Patient presents with  . Nausea  . Emesis  . Diarrhea     (Consider location/radiation/quality/duration/timing/severity/associated sxs/prior Treatment) HPI Comments: Patient is a 34 year old female presents the emergency department for further evaluation of diarrhea. Patient states that she has had diarrhea over the past 4 days which is watery and nonbloody. She reports approximately 10-15 episodes of diarrhea today. She states that she has also been experiencing some cramping in her epigastric abdomen. Patient denies any modifying factors of her symptoms as well as any associated fever, chest pain, shortness of breath, nausea, vomiting, dysuria or hematuria, vaginal bleeding, or vaginal discharge. Patient reports admission in November 2015 for colitis. She denies any diarrhea at this time; c/o was only of b/l flank pain. She was tx in the hospital with abx and symptoms resolved following discharge. Patient was instructed to follow-up with a gastroenterologist. She states that she has been unable to do this secondary to lack of insurance and finances. Patient denies a history of abdominal surgeries.  Patient is a 34 y.o. female presenting with vomiting and diarrhea. The history is provided by the patient. No language interpreter was used.  Emesis Associated symptoms: abdominal pain, chills and diarrhea   Diarrhea Associated symptoms: abdominal pain and chills   Associated symptoms: no fever and no vomiting     Past Medical History  Diagnosis Date  . Suicidal intent   . Hypothyroidism   . Pituitary tumor   . Colitis    Past Surgical History  Procedure Laterality Date  . Tubal ligation    . Tonsillectomy     History reviewed. No pertinent family history. History  Substance Use Topics  . Smoking status: Current Every Day Smoker -- 1.00 packs/day     Types: Cigarettes  . Smokeless tobacco: Never Used  . Alcohol Use: No   OB History    No data available      Review of Systems  Constitutional: Positive for chills. Negative for fever.  Respiratory: Negative for shortness of breath.   Cardiovascular: Negative for chest pain.  Gastrointestinal: Positive for abdominal pain and diarrhea. Negative for nausea and vomiting.  Genitourinary: Negative for dysuria, hematuria, vaginal bleeding and vaginal discharge.  All other systems reviewed and are negative.   Allergies  Review of patient's allergies indicates no known allergies.  Home Medications   Prior to Admission medications   Medication Sig Start Date End Date Taking? Authorizing Provider  ALPRAZolam Duanne Moron) 0.5 MG tablet Take 0.5 mg by mouth 3 (three) times daily as needed for anxiety.   Yes Historical Provider, MD  FLUoxetine (PROZAC) 40 MG capsule Take 40 mg by mouth daily.   Yes Historical Provider, MD  ibuprofen (ADVIL,MOTRIN) 200 MG tablet Take 600 mg by mouth every 6 (six) hours as needed for mild pain.   Yes Historical Provider, MD  levothyroxine (SYNTHROID, LEVOTHROID) 175 MCG tablet Take 175 mcg by mouth daily.     Yes Historical Provider, MD  omeprazole (PRILOSEC) 20 MG capsule Take 1 capsule (20 mg total) by mouth 2 (two) times daily before a meal. 03/20/14  Yes Courtney A Forcucci, PA-C  ranitidine (ZANTAC) 150 MG tablet Take 1 tablet (150 mg total) by mouth at bedtime. 03/20/14  Yes Courtney A Forcucci, PA-C  SUMAtriptan (IMITREX) 50 MG tablet Take 50 mg by mouth every 2 (  two) hours as needed for migraine or headache. May repeat in 2 hours if headache persists or recurs.   Yes Historical Provider, MD  dicyclomine (BENTYL) 20 MG tablet Take 1 tablet (20 mg total) by mouth 2 (two) times daily. 08/06/14   Antonietta Breach, PA-C  oxyCODONE (ROXICODONE) 5 MG immediate release tablet Take 1 tablet (5 mg total) by mouth every 6 (six) hours as needed for severe pain. Patient not  taking: Reported on 08/06/2014 05/05/14   Phillips Climes, MD  polyethylene glycol [facility]) packet Take 17 g by mouth daily. Patient not taking: Reported on 08/06/2014 05/05/14   Phillips Climes, MD  sucralfate (CARAFATE) 1 G tablet Take 1 tablet (1 g total) by mouth 2 (two) times daily with a meal. Patient not taking: Reported on 08/06/2014 05/02/14   Anderson Malta L Piepenbrink, PA-C   BP 91/66 mmHg  Pulse 123  Temp(Src) 98.2 F (36.8 C) (Oral)  Resp 12  Wt 145 lb 3.2 oz (65.862 kg)  SpO2 98%  LMP 07/29/2014   Physical Exam  Constitutional: She is oriented to person, place, and time. She appears well-developed and well-nourished. No distress.  Nontoxic/nonseptic appearing  HENT:  Head: Normocephalic and atraumatic.  Mouth/Throat: Oropharynx is clear and moist. No oropharyngeal exudate.  Eyes: Conjunctivae and EOM are normal. No scleral icterus.  Neck: Normal range of motion.  Cardiovascular: Normal rate, regular rhythm and intact distal pulses.   Pulmonary/Chest: Effort normal and breath sounds normal. No respiratory distress. She has no wheezes. She has no rales.  Respirations even and unlabored  Abdominal: Soft. She exhibits no distension. There is tenderness. There is no rebound and no guarding.  Soft abdomen with generalized TTP, mostly in the RUQ, epigastrium, suprapubic region and LLQ. No distinct focal TTP. No masses, guarding, or peritoneal signs.  Musculoskeletal: Normal range of motion.  Neurological: She is alert and oriented to person, place, and time. She exhibits normal muscle tone. Coordination normal.  GCS 15. Speech is goal oriented. Patient moves extremities without ataxia. She ambulates with steady gait.  Skin: Skin is warm and dry. No rash noted. She is not diaphoretic. No erythema. No pallor.  Psychiatric: She has a normal mood and affect. Her behavior is normal.  Nursing note and vitals reviewed.   ED Course  Procedures (including critical care time) Labs  Review Labs Reviewed  COMPREHENSIVE METABOLIC PANEL - Abnormal; Notable for the following:    GFR calc non Af Amer 87 (*)    All other components within normal limits  URINALYSIS, ROUTINE W REFLEX MICROSCOPIC - Abnormal; Notable for the following:    Ketones, ur 15 (*)    All other components within normal limits  CLOSTRIDIUM DIFFICILE BY PCR  CBC WITH DIFFERENTIAL/PLATELET  LIPASE, BLOOD  PREGNANCY, URINE  GI PATHOGEN PANEL BY PCR, STOOL    Imaging Review No results found.   EKG Interpretation None      MDM   Final diagnoses:  Diarrhea  Generalized abdominal pain    34 year old female presents to the emergency department for further evaluation of diarrhea with associated pain in her epigastric region. Patient reports a history of similar symptoms in the past. She also has a history of hospitalization for colitis in November 2015. Patient has not followed up with a gastroenterologist since this time secondary to lack of resources. Patient has a soft abdomen without peritoneal signs. She has no areas of focal tenderness and does exhibit some mild to moderate tenderness in multiple quadrants on deep palpation.  This remains stable on reexamination.  Patient treated in ED with morphine, Reglan, Bentyl and 1 L IV fluids. Patient with stable orthostatic vital signs. She has no leukocytosis, anemia, or electrolyte imbalance. Liver and kidney function preserved. Urinalysis does not suggest infection. Patient order to have stool sample and C. difficile panel completed, but she states that she is unable to stool at this time. No BM or diarrhea x 2+ hours in ED.   Doubt SBO or pSBO. Also low suspicion for diverticulitis. Doubt appendicitis. No evidence to suggest acute cholecystitis. Abdominal reexamination stable over ED course. Do not believe further emergent workup or imaging is indicated at this time. Patient also expressing desire to go home. She states her pain has resolved. Have  discussed with patient that symptoms may be secondary to a viral process. Have also recommended that she follow up with a gastroenterologist for further evaluation of recurrent abdominal pain symptoms. Patient provided prescription for Bentyl. Return precautions given. Patient agreeable to plan with no unaddressed concerns. Patient discharged in good condition.    Antonietta Breach, PA-C 08/09/14 [place], MD 08/09/14 0730

## 2014-08-06 NOTE — ED Notes (Signed)
Pt c/o recurrent colitis. Pt was admitted and discharged for same in November. Pt reports similar symptoms with chills, denies fevers at home.

## 2014-08-06 NOTE — ED Notes (Signed)
Pt. Refused wheelchair and left with all belongings 

## 2014-08-06 NOTE — Discharge Instructions (Signed)

## 2014-09-25 ENCOUNTER — Emergency Department (HOSPITAL_COMMUNITY)
Admission: EM | Admit: 2014-09-25 | Discharge: 2014-09-25 | Disposition: A | Discharge status: Home / Self Care | Payer: Medicaid Other | Attending: Emergency Medicine | Admitting: Emergency Medicine

## 2014-09-25 ENCOUNTER — Encounter (HOSPITAL_COMMUNITY): Payer: Self-pay | Admitting: *Deleted

## 2014-09-25 ENCOUNTER — Emergency Department (HOSPITAL_COMMUNITY): Payer: Medicaid Other

## 2014-09-25 DIAGNOSIS — E039 Hypothyroidism, unspecified: Secondary | ICD-10-CM | POA: Diagnosis not present

## 2014-09-25 DIAGNOSIS — Z79899 Other long term (current) drug therapy: Secondary | ICD-10-CM | POA: Insufficient documentation

## 2014-09-25 DIAGNOSIS — Z3202 Encounter for pregnancy test, result negative: Secondary | ICD-10-CM | POA: Diagnosis not present

## 2014-09-25 DIAGNOSIS — K5900 Constipation, unspecified: Secondary | ICD-10-CM

## 2014-09-25 DIAGNOSIS — Z720 Tobacco use: Secondary | ICD-10-CM | POA: Insufficient documentation

## 2014-09-25 DIAGNOSIS — R109 Unspecified abdominal pain: Secondary | ICD-10-CM

## 2014-09-25 DIAGNOSIS — K529 Noninfective gastroenteritis and colitis, unspecified: Secondary | ICD-10-CM | POA: Diagnosis not present

## 2014-09-25 DIAGNOSIS — R1084 Generalized abdominal pain: Secondary | ICD-10-CM | POA: Diagnosis present

## 2014-09-25 LAB — URINALYSIS, ROUTINE W REFLEX MICROSCOPIC
BILIRUBIN URINE: NEGATIVE
GLUCOSE, UA: NEGATIVE mg/dL
HGB URINE DIPSTICK: NEGATIVE
Ketones, ur: NEGATIVE mg/dL
Leukocytes, UA: NEGATIVE
Nitrite: NEGATIVE
PH: 6.5 (ref 5.0–8.0)
Protein, ur: NEGATIVE mg/dL
SPECIFIC GRAVITY, URINE: 1.019 (ref 1.005–1.030)
Urobilinogen, UA: 0.2 mg/dL (ref 0.0–1.0)

## 2014-09-25 LAB — COMPREHENSIVE METABOLIC PANEL
ALT: 15 U/L (ref 0–35)
AST: 23 U/L (ref 0–37)
Albumin: 3.9 g/dL (ref 3.5–5.2)
Alkaline Phosphatase: 48 U/L (ref 39–117)
Anion gap: 10 (ref 5–15)
BILIRUBIN TOTAL: 0.3 mg/dL (ref 0.3–1.2)
BUN: 7 mg/dL (ref 6–23)
CHLORIDE: 98 mmol/L (ref 96–112)
CO2: 31 mmol/L (ref 19–32)
Calcium: 9.5 mg/dL (ref 8.4–10.5)
Creatinine, Ser: 0.97 mg/dL (ref 0.50–1.10)
GFR calc Af Amer: 87 mL/min — ABNORMAL LOW (ref >90)
GFR, EST NON AFRICAN AMERICAN: 75 mL/min — AB (ref >90)
Glucose, Bld: 114 mg/dL — ABNORMAL HIGH (ref 70–99)
Potassium: 3.4 mmol/L — ABNORMAL LOW (ref 3.5–5.1)
Sodium: 139 mmol/L (ref 135–145)
Total Protein: 6.6 g/dL (ref 6.0–8.3)

## 2014-09-25 LAB — CBC WITH DIFFERENTIAL/PLATELET
BASOS ABS: 0.1 10*3/uL (ref 0.0–0.1)
BASOS PCT: 1 % (ref 0–1)
EOS ABS: 0.2 10*3/uL (ref 0.0–0.7)
Eosinophils Relative: 3 % (ref 0–5)
HCT: 39.8 % (ref 36.0–46.0)
HEMOGLOBIN: 12.9 g/dL (ref 12.0–15.0)
LYMPHS ABS: 4 10*3/uL (ref 0.7–4.0)
LYMPHS PCT: 54 % — AB (ref 12–46)
MCH: 32.7 pg (ref 26.0–34.0)
MCHC: 32.4 g/dL (ref 30.0–36.0)
MCV: 100.8 fL — ABNORMAL HIGH (ref 78.0–100.0)
MONOS PCT: 4 % (ref 3–12)
Monocytes Absolute: 0.3 10*3/uL (ref 0.1–1.0)
NEUTROS ABS: 2.8 10*3/uL (ref 1.7–7.7)
NEUTROS PCT: 38 % — AB (ref 43–77)
Platelets: 260 10*3/uL (ref 150–400)
RBC: 3.95 MIL/uL (ref 3.87–5.11)
RDW: 12.8 % (ref 11.5–15.5)
WBC: 7.4 10*3/uL (ref 4.0–10.5)

## 2014-09-25 LAB — LIPASE, BLOOD: LIPASE: 22 U/L (ref 11–59)

## 2014-09-25 LAB — POC URINE PREG, ED: Preg Test, Ur: NEGATIVE

## 2014-09-25 MED ORDER — SODIUM CHLORIDE 0.9 % IV BOLUS (SEPSIS)
1000.0000 mL | Freq: Once | INTRAVENOUS | Status: AC
  Administered 2014-09-25: 1000 mL via INTRAVENOUS

## 2014-09-25 MED ORDER — HYOSCYAMINE SULFATE 0.5 MG/ML IJ SOLN
0.1250 mg | Freq: Once | INTRAMUSCULAR | Status: AC
  Administered 2014-09-25: 0.125 mg via INTRAVENOUS
  Filled 2014-09-25: qty 0.25

## 2014-09-25 MED ORDER — PEG 3350-KCL-NABCB-NACL-NASULF 236 G PO SOLR: 4000.0000 mL | Freq: Once | ORAL | Status: DC

## 2014-09-25 MED ORDER — HYOSCYAMINE SULFATE 0.125 MG SL SUBL: 0.1250 mg | SUBLINGUAL_TABLET | SUBLINGUAL | Status: DC | PRN

## 2014-09-25 NOTE — ED Provider Notes (Signed)
CSN: [id number]     Arrival date & time 09/25/14  1818 History   First MD Initiated Contact with Patient 09/25/14 2014     Chief Complaint  Patient presents with  . Abdominal Pain  . Constipation     (Consider location/radiation/quality/duration/timing/severity/associated sxs/prior Treatment) Patient is a 34 y.o. female presenting with abdominal pain and constipation. The history is provided by the patient. No language interpreter was used.  Abdominal Pain Pain location:  Generalized Pain quality: aching   Pain radiates to:  Back Pain severity:  Moderate Onset quality:  Gradual Duration:  2 weeks Progression:  Waxing and waning Chronicity:  Recurrent Associated symptoms: constipation and vomiting   Associated symptoms: no chest pain, no chills, no cough, no dysuria, no fever and no shortness of breath   Associated symptoms comment:  Generalized abdominal pain for 2 weeks that has been mild and intermittent becoming more intense and more constant in the last 2 days. No fever. Today with nausea and vomiting. She reports no bowel movement for the past 2 weeks, despite use of laxatives, enemas, stool softeners and dietary changes  Including increased fiber and water. She reports a gradual 35 pound weight gain.  Constipation Associated symptoms: abdominal pain, back pain and vomiting   Associated symptoms: no dysuria and no fever     Past Medical History  Diagnosis Date  . Suicidal intent   . Hypothyroidism   . Pituitary tumor   . Colitis    Past Surgical History  Procedure Laterality Date  . Tubal ligation    . Tonsillectomy     History reviewed. No pertinent family history. History  Substance Use Topics  . Smoking status: Current Every Day Smoker -- 1.00 packs/day    Types: Cigarettes  . Smokeless tobacco: Never Used  . Alcohol Use: No   OB History    No data available     Review of Systems  Constitutional: Negative for fever and chills.  Respiratory: Negative.   Negative for cough and shortness of breath.   Cardiovascular: Negative.  Negative for chest pain.  Gastrointestinal: Positive for vomiting, abdominal pain and constipation.  Genitourinary: Negative for dysuria, frequency and menstrual problem.  Musculoskeletal: Positive for back pain.  Skin: Negative.   Neurological: Negative.  Negative for syncope, weakness and light-headedness.      Allergies  Review of patient's allergies indicates no known allergies.  Home Medications   Prior to Admission medications   Medication Sig Start Date End Date Taking? Authorizing Provider  ALPRAZolam Duanne Moron) 0.5 MG tablet Take 0.5 mg by mouth 3 (three) times daily as needed for anxiety.   Yes Historical Provider, MD  dicyclomine (BENTYL) 20 MG tablet Take 1 tablet (20 mg total) by mouth 2 (two) times daily. 08/06/14  Yes Antonietta Breach, PA-C  FLUoxetine (PROZAC) 40 MG capsule Take 40 mg by mouth daily.   Yes Historical Provider, MD  ibuprofen (ADVIL,MOTRIN) 200 MG tablet Take 600 mg by mouth every 6 (six) hours as needed for mild pain.   Yes Historical Provider, MD  levothyroxine (SYNTHROID, LEVOTHROID) 175 MCG tablet Take 175 mcg by mouth daily.     Yes Historical Provider, MD  omeprazole (PRILOSEC) 20 MG capsule Take 1 capsule (20 mg total) by mouth 2 (two) times daily before a meal. 03/20/14  Yes Courtney Forcucci, PA-C  ranitidine (ZANTAC) 150 MG tablet Take 1 tablet (150 mg total) by mouth at bedtime. 03/20/14  Yes Courtney Forcucci, PA-C  SUMAtriptan (IMITREX) 50 MG tablet Take  50 mg by mouth every 2 (two) hours as needed for migraine or headache. May repeat in 2 hours if headache persists or recurs.   Yes Historical Provider, MD  oxyCODONE (ROXICODONE) 5 MG immediate release tablet Take 1 tablet (5 mg total) by mouth every 6 (six) hours as needed for severe pain. Patient not taking: Reported on 08/06/2014 05/05/14   Silver Huguenin Elgergawy, MD  polyethylene glycol [facility]) packet Take 17 g by mouth  daily. Patient not taking: Reported on 08/06/2014 05/05/14   Silver Huguenin Elgergawy, MD  sucralfate (CARAFATE) 1 G tablet Take 1 tablet (1 g total) by mouth 2 (two) times daily with a meal. Patient not taking: Reported on 08/06/2014 05/02/14   Anderson Malta Piepenbrink, PA-C   BP 110/76 mmHg  Pulse 79  Temp(Src) 98.1 F (36.7 C) (Oral)  Resp 18  Ht 5\' 2"  (1.575 m)  Wt 151 lb 11.2 oz (68.811 kg)  BMI 27.74 kg/m2  SpO2 99%  LMP 09/18/2014 Physical Exam  Constitutional: She is oriented to person, place, and time. She appears well-developed and well-nourished.  HENT:  Head: Normocephalic.  Neck: Normal range of motion. Neck supple.  Cardiovascular: Normal rate and regular rhythm.   Pulmonary/Chest: Effort normal and breath sounds normal.  Abdominal: Soft. Bowel sounds are normal. There is tenderness. There is no rebound and no guarding.  Generalized, diffuse tenderness to soft abdomen. No distension. No guarding/rebound. BS positive throughout.  Musculoskeletal: Normal range of motion.  Neurological: She is alert and oriented to person, place, and time.  Skin: Skin is warm and dry. No rash noted.  Psychiatric: She has a normal mood and affect.    ED Course  Procedures (including critical care time) Labs Review Labs Reviewed  COMPREHENSIVE METABOLIC PANEL - Abnormal; Notable for the following:    Potassium 3.4 (*)    Glucose, Bld 114 (*)    GFR calc non Af Amer 75 (*)    GFR calc Af Amer 87 (*)    All other components within normal limits  CBC WITH DIFFERENTIAL/PLATELET - Abnormal; Notable for the following:    MCV 100.8 (*)    Neutrophils Relative % 38 (*)    Lymphocytes Relative 54 (*)    All other components within normal limits  LIPASE, BLOOD  URINALYSIS, ROUTINE W REFLEX MICROSCOPIC  POC URINE PREG, ED    Imaging Review Dg Abd 2 Views  09/25/2014   CLINICAL DATA:  Central abdominal pain with constipation two weeks. Nausea and vomiting with fatigue today.  EXAM: ABDOMEN - 2  VIEW  COMPARISON:  CT 05/04/2014 and abdominal films 03/20/2014  FINDINGS: Exam demonstrates moderate fecal retention throughout the colon. There are a few air-filled loops of small bowel in the right mid abdomen without significant dilatation with a few nonspecific air-fluid levels. No evidence of free peritoneal air. Remaining bones soft tissues are within normal.  IMPRESSION: Nonobstructive bowel gas pattern with moderate fecal retention throughout the colon.   Electronically Signed   By: Marin Olp M.D.   On: 09/25/2014 19:23     EKG Interpretation None      MDM   Final diagnoses:  Abdominal pain  1. Constipation   The patient has asked for pain medication. Discussed that narcotic pain relievers were contraindicated with history and abdominal imaging showing significant constipation. Will try alternative pain management. Will hydrate given blood pressure of 89 systolic on the monitor during exam. Reassess after fluids.  Blood pressure improves. No orthostasis. She reports a usually  low blood pressure. Discussed normal evaluation - doubt recurrence of colitis, suspect discomfort is from constipation. Will provide strong laxative, GI referral as this is recurrent.    Charlann Lange, PA-C 09/26/14 0408  Orpah Greek, MD 09/26/14 Einar Crow

## 2014-09-25 NOTE — Discharge Instructions (Signed)

## 2014-09-25 NOTE — ED Notes (Signed)
Pt reports having abd pain and constipation. No bowel movement x 2 weeks. Has taken otc laxatives, stool softeners and enemas. Having fatigue and n/v today.

## 2014-11-11 ENCOUNTER — Emergency Department (HOSPITAL_COMMUNITY)
Admission: EM | Admit: 2014-11-11 | Discharge: 2014-11-12 | Disposition: A | Discharge status: Home / Self Care | Payer: Medicaid Other | Attending: Emergency Medicine | Admitting: Emergency Medicine

## 2014-11-11 ENCOUNTER — Emergency Department (HOSPITAL_COMMUNITY): Payer: Medicaid Other

## 2014-11-11 ENCOUNTER — Encounter (HOSPITAL_COMMUNITY): Payer: Self-pay | Admitting: Emergency Medicine

## 2014-11-11 DIAGNOSIS — Z79899 Other long term (current) drug therapy: Secondary | ICD-10-CM | POA: Insufficient documentation

## 2014-11-11 DIAGNOSIS — R110 Nausea: Secondary | ICD-10-CM | POA: Diagnosis not present

## 2014-11-11 DIAGNOSIS — K5900 Constipation, unspecified: Secondary | ICD-10-CM | POA: Diagnosis not present

## 2014-11-11 DIAGNOSIS — Z8589 Personal history of malignant neoplasm of other organs and systems: Secondary | ICD-10-CM | POA: Insufficient documentation

## 2014-11-11 DIAGNOSIS — R5383 Other fatigue: Secondary | ICD-10-CM

## 2014-11-11 DIAGNOSIS — E039 Hypothyroidism, unspecified: Secondary | ICD-10-CM | POA: Insufficient documentation

## 2014-11-11 DIAGNOSIS — Z720 Tobacco use: Secondary | ICD-10-CM | POA: Insufficient documentation

## 2014-11-11 DIAGNOSIS — R109 Unspecified abdominal pain: Secondary | ICD-10-CM | POA: Diagnosis present

## 2014-11-11 LAB — COMPREHENSIVE METABOLIC PANEL
ALK PHOS: 42 U/L (ref 38–126)
ALT: 31 U/L (ref 14–54)
AST: 28 U/L (ref 15–41)
Albumin: 3.3 g/dL — ABNORMAL LOW (ref 3.5–5.0)
Anion gap: 7 (ref 5–15)
BILIRUBIN TOTAL: 0.3 mg/dL (ref 0.3–1.2)
BUN: <5 mg/dL — ABNORMAL LOW (ref 6–20)
CO2: 27 mmol/L (ref 22–32)
Calcium: 9 mg/dL (ref 8.9–10.3)
Chloride: 105 mmol/L (ref 101–111)
Creatinine, Ser: 0.57 mg/dL (ref 0.44–1.00)
GFR calc Af Amer: >60 mL/min (ref >60)
GFR calc non Af Amer: >60 mL/min (ref >60)
Glucose, Bld: 101 mg/dL — ABNORMAL HIGH (ref 65–99)
Potassium: 4.1 mmol/L (ref 3.5–5.1)
Sodium: 139 mmol/L (ref 135–145)
TOTAL PROTEIN: 5.8 g/dL — AB (ref 6.5–8.1)

## 2014-11-11 LAB — CBC WITH DIFFERENTIAL/PLATELET
Basophils Absolute: 0 10*3/uL (ref 0.0–0.1)
Basophils Relative: 1 % (ref 0–1)
Eosinophils Absolute: 0.1 10*3/uL (ref 0.0–0.7)
Eosinophils Relative: 1 % (ref 0–5)
HCT: 36.7 % (ref 36.0–46.0)
Hemoglobin: 11.8 g/dL — ABNORMAL LOW (ref 12.0–15.0)
LYMPHS ABS: 2.6 10*3/uL (ref 0.7–4.0)
LYMPHS PCT: 46 % (ref 12–46)
MCH: 32.1 pg (ref 26.0–34.0)
MCHC: 32.2 g/dL (ref 30.0–36.0)
MCV: 99.7 fL (ref 78.0–100.0)
Monocytes Absolute: 0.4 10*3/uL (ref 0.1–1.0)
Monocytes Relative: 8 % (ref 3–12)
NEUTROS PCT: 44 % (ref 43–77)
Neutro Abs: 2.4 10*3/uL (ref 1.7–7.7)
Platelets: 210 10*3/uL (ref 150–400)
RBC: 3.68 MIL/uL — AB (ref 3.87–5.11)
RDW: 12.9 % (ref 11.5–15.5)
WBC: 5.5 10*3/uL (ref 4.0–10.5)

## 2014-11-11 LAB — URINALYSIS, ROUTINE W REFLEX MICROSCOPIC
Bilirubin Urine: NEGATIVE
GLUCOSE, UA: NEGATIVE mg/dL
Hgb urine dipstick: NEGATIVE
KETONES UR: NEGATIVE mg/dL
Leukocytes, UA: NEGATIVE
Nitrite: NEGATIVE
Protein, ur: NEGATIVE mg/dL
Specific Gravity, Urine: 1.007 (ref 1.005–1.030)
UROBILINOGEN UA: 0.2 mg/dL (ref 0.0–1.0)
pH: 7 (ref 5.0–8.0)

## 2014-11-11 LAB — I-STAT TROPONIN, ED: Troponin i, poc: 0.01 ng/mL (ref 0.00–0.08)

## 2014-11-11 LAB — D-DIMER, QUANTITATIVE (NOT AT ARMC)

## 2014-11-11 NOTE — ED Provider Notes (Signed)
CSN: [id number]     Arrival date & time 11/11/14  1742 History   First MD Initiated Contact with Patient 11/11/14 2229     Chief Complaint  Patient presents with  . Abdominal Pain  . Shortness of Breath    (Consider location/radiation/quality/duration/timing/severity/associated sxs/prior Treatment) HPI Comments: Patient is a 34 year old female with a history of hypothyroidism, pituitary tumor, and colitis. She presents to the emergency department for further evaluation of multiple complaints. Patient reports that she has been experiencing abdominal pain intermittently over the past few months. She states that it most recently occurred one week ago. Pain is dull and aching and present in her suprapubic abdomen. She sometimes experiences radiation of the pain to her low back. Patient has not had a bowel movement in 1.5 weeks. She did pass flatus 3 days ago. Patient has been taking constipation medications such as MiraLAX, stool softeners, and over-the-counter enemas without relief of her abdominal discomfort. She states that she feels as though she is speaking less than usual. She noticed 3 days ago a sensation of overwhelming fatigue. She states that she feels tired all the time and, with this, she has noticed associated lower extremity edema; L>R. She complains of mild chest pressure in the right side of her chest with shortness of breath. This is sporadic. Patient has seen her PCP, Dr. Oval Linsey, for these complaints and has experienced no improvement. She feels as though she needs to see an endocrinologist, but states "My doctor said my thyroid was fine and I didn't need to see one". She denies fever, LOC, vomiting, diarrhea, melena, hematochezia, dysuria, hematuria, and vaginal complaints. LMP 2 weeks ago. She has a hx of a tubal ligation.  Patient is a 34 y.o. female presenting with abdominal pain and shortness of breath. The history is provided by the patient. No language interpreter was used.   Abdominal Pain Associated symptoms: constipation, fatigue, nausea and shortness of breath   Associated symptoms: no diarrhea, no dysuria, no fever, no vaginal bleeding, no vaginal discharge and no vomiting   Shortness of Breath Associated symptoms: abdominal pain   Associated symptoms: no fever and no vomiting     Past Medical History  Diagnosis Date  . Suicidal intent   . Hypothyroidism   . Pituitary tumor   . Colitis    Past Surgical History  Procedure Laterality Date  . Tubal ligation    . Tonsillectomy     History reviewed. No pertinent family history. History  Substance Use Topics  . Smoking status: Current Every Day Smoker -- 1.00 packs/day    Types: Cigarettes  . Smokeless tobacco: Never Used  . Alcohol Use: No   OB History    No data available      Review of Systems  Constitutional: Positive for fatigue. Negative for fever.  Respiratory: Positive for shortness of breath.   Gastrointestinal: Positive for nausea, abdominal pain and constipation. Negative for vomiting and diarrhea.  Genitourinary: Positive for decreased urine volume. Negative for dysuria, vaginal bleeding and vaginal discharge.  Musculoskeletal: Positive for back pain.  Neurological: Negative for syncope.  All other systems reviewed and are negative.   Allergies  Review of patient's allergies indicates no known allergies.  Home Medications   Prior to Admission medications   Medication Sig Start Date End Date Taking? Authorizing Provider  ALPRAZolam Duanne Moron) 0.5 MG tablet Take 0.5 mg by mouth 3 (three) times daily as needed for anxiety.    Historical Provider, MD  FLUoxetine (PROZAC) 40 MG  capsule Take 40 mg by mouth daily.    Historical Provider, MD  hyoscyamine (LEVSIN/SL) 0.125 MG SL tablet Place 1 tablet (0.125 mg total) under the tongue every 4 (four) hours as needed. 09/25/14   Charlann Lange, PA-C  ibuprofen (ADVIL,MOTRIN) 200 MG tablet Take 600 mg by mouth every 6 (six) hours as needed  for mild pain.    Historical Provider, MD  levothyroxine (SYNTHROID, LEVOTHROID) 175 MCG tablet Take 175 mcg by mouth daily.      Historical Provider, MD  omeprazole (PRILOSEC) 20 MG capsule Take 1 capsule (20 mg total) by mouth 2 (two) times daily before a meal. 03/20/14   Courtney Forcucci, PA-C  oxyCODONE (ROXICODONE) 5 MG immediate release tablet Take 1 tablet (5 mg total) by mouth every 6 (six) hours as needed for severe pain. Patient not taking: Reported on 08/06/2014 05/05/14   Silver Huguenin Elgergawy, MD  polyethylene glycol (GOLYTELY) 236 G solution Take 4,000 mLs by mouth once. 09/25/14   Charlann Lange, PA-C  polyethylene glycol (MIRALAX) packet Take 17 g by mouth daily. Patient not taking: Reported on 08/06/2014 05/05/14   Silver Huguenin Elgergawy, MD  ranitidine (ZANTAC) 150 MG tablet Take 1 tablet (150 mg total) by mouth at bedtime. 03/20/14   Courtney Forcucci, PA-C  sucralfate (CARAFATE) 1 G tablet Take 1 tablet (1 g total) by mouth 2 (two) times daily with a meal. Patient not taking: Reported on 08/06/2014 05/02/14   Baron Sane, PA-C  SUMAtriptan (IMITREX) 50 MG tablet Take 50 mg by mouth every 2 (two) hours as needed for migraine or headache. May repeat in 2 hours if headache persists or recurs.    Historical Provider, MD   BP 90/52 mmHg  Pulse 86  Temp(Src) 98.7 F (37.1 C) (Oral)  Resp 20  SpO2 96%   Physical Exam  Constitutional: She is oriented to person, place, and time. She appears well-developed and well-nourished. No distress.  Patient is calm, pleasant, and nontoxic/nonseptic appearing  HENT:  Head: Normocephalic and atraumatic.  Eyes: Conjunctivae and EOM are normal. No scleral icterus.  Neck: Normal range of motion.  Cardiovascular: Normal rate, regular rhythm and intact distal pulses.   Pulmonary/Chest: Effort normal and breath sounds normal. No respiratory distress. She has no wheezes. She has no rales.  Respirations even and unlabored  Abdominal: Soft. She  exhibits no distension. There is no tenderness. There is no rebound and no guarding.  Soft abdomen with mildly hyperactive bowel sounds. No tenderness to palpation, masses, or peritoneal signs.  Musculoskeletal: Normal range of motion.  Neurological: She is alert and oriented to person, place, and time. She exhibits normal muscle tone. Coordination normal.  Skin: Skin is warm and dry. No rash noted. She is not diaphoretic. No erythema. No pallor.  Psychiatric: She has a normal mood and affect. Her behavior is normal.  Nursing note and vitals reviewed.   ED Course  Procedures (including critical care time) Labs Review Labs Reviewed  CBC WITH DIFFERENTIAL/PLATELET - Abnormal; Notable for the following:    RBC 3.68 (*)    Hemoglobin 11.8 (*)    All other components within normal limits  COMPREHENSIVE METABOLIC PANEL - Abnormal; Notable for the following:    Glucose, Bld 101 (*)    BUN <5 (*)    Total Protein 5.8 (*)    Albumin 3.3 (*)    All other components within normal limits  URINALYSIS, ROUTINE W REFLEX MICROSCOPIC (NOT AT [facility])  D-DIMER, QUANTITATIVE (NOT AT [facility])  Randolm Idol, ED    Imaging Review Dg Abd Acute W/chest  11/11/2014   CLINICAL DATA:  Shortness of breath, abdominal pain, back pain, leg swelling, and decreased urinary output and constipation for several days.  EXAM: DG ABDOMEN ACUTE W/ 1V CHEST  COMPARISON:  Abdominal radiographs 09/25/2014. Chest radiographs 03/20/2014.  FINDINGS: The cardiomediastinal silhouette is within normal limits. Minimal linear opacities in the lung bases likely reflects scarring or subsegmental atelectasis. No segmental airspace consolidation, edema, pleural effusion, or pneumothorax is identified.  There is no evidence of intraperitoneal free air. There is a moderate amount of stool throughout the colon. There is a paucity of small bowel gas without dilated bowel loops or air-fluid levels seen to suggest obstruction. No acute osseous  abnormality is identified.  IMPRESSION: 1. And minimal bibasilar atelectasis or scarring. 2. Moderate amount of colonic stool. No evidence of bowel obstruction.   Electronically Signed   By: Logan Bores   On: 11/11/2014 19:50     EKG Interpretation None      MDM   Final diagnoses:  Abdominal pain, unspecified abdominal location  Other fatigue  Constipation, unspecified constipation type    35 year old female presents to the emergency department with multiple complaints. Patient mostly complaining of worsening fatigue over the past 3 days. She has noted swelling in her bilateral lower extremities, left worse than right. She has also been experiencing intermittent abdominal pain, which is dull, over the past few months. No associated fever. Patient is hemodynamically stable. Abdomen is soft which is stable on reexam. Laboratory work up is consistent with priors. Dimer is negative; doubt PE or DVT in this patient especially with no tachycardia, tachypnea, dyspnea, or hypoxia.  Given patient's history of a pituitary tumor and hypothyroidism, I have a higher suspicion that patient's symptoms are of endocrine etiology. No evidence of acute/emergent process today. Will refer to endocrinologist for further evaluation of her symptoms as well as refer patient back to her PCP. Return precautions discussed and provided. Patient agreeable to plan with no unaddressed concerns. Patient discharged in good condition; VSS.   Filed Vitals:   11/11/14 2218 11/11/14 2300 11/11/14 2330 11/12/14 0000  BP: 90/56 104/82 92/54 90/52   Pulse: 86 87 76 86  Temp:      TempSrc:      Resp: 16 24 13 20   SpO2: 99% 98% 97% 96%     Antonietta Breach, PA-C 11/12/14 7989  Everlene Balls, MD 11/12/14 [phone]

## 2014-11-11 NOTE — ED Notes (Signed)
Pt sts swelling in her legs, SOB, abd pain,back pain, decreased urinary output and constipation

## 2014-11-12 ENCOUNTER — Telehealth (HOSPITAL_BASED_OUTPATIENT_CLINIC_OR_DEPARTMENT_OTHER): Payer: Self-pay | Admitting: Emergency Medicine

## 2014-11-12 NOTE — Discharge Instructions (Signed)
Fatigue °Fatigue is a feeling of tiredness, lack of energy, lack of motivation, or feeling tired all the time. Having enough rest, good nutrition, and reducing stress will normally reduce fatigue. Consult your caregiver if it persists. The nature of your fatigue will help your caregiver to find out its cause. The treatment is based on the cause.  °CAUSES  °There are many causes for fatigue. Most of the time, fatigue can be traced to one or more of your habits or routines. Most causes fit into one or more of three general areas. They are: °Lifestyle problems °· Sleep disturbances. °· Overwork. °· Physical exertion. °· Unhealthy habits. °· Poor eating habits or eating disorders. °· Alcohol and/or drug use . °· Lack of proper nutrition (malnutrition). °Psychological problems °· Stress and/or anxiety problems. °· Depression. °· Grief. °· Boredom. °Medical Problems or Conditions °· Anemia. °· Pregnancy. °· Thyroid gland problems. °· Recovery from major surgery. °· Continuous pain. °· Emphysema or asthma that is not well controlled °· Allergic conditions. °· Diabetes. °· Infections (such as mononucleosis). °· Obesity. °· Sleep disorders, such as sleep apnea. °· Heart failure or other heart-related problems. °· Cancer. °· Kidney disease. °· Liver disease. °· Effects of certain medicines such as antihistamines, cough and cold remedies, prescription pain medicines, heart and blood pressure medicines, drugs used for treatment of cancer, and some antidepressants. °SYMPTOMS  °The symptoms of fatigue include:  °· Lack of energy. °· Lack of drive (motivation). °· Drowsiness. °· Feeling of indifference to the surroundings. °DIAGNOSIS  °The details of how you feel help guide your caregiver in finding out what is causing the fatigue. You will be asked about your present and past health condition. It is important to review all medicines that you take, including prescription and non-prescription items. A thorough exam will be done.  You will be questioned about your feelings, habits, and normal lifestyle. Your caregiver may suggest blood tests, urine tests, or other tests to look for common medical causes of fatigue.  °TREATMENT  °Fatigue is treated by correcting the underlying cause. For example, if you have continuous pain or depression, treating these causes will improve how you feel. Similarly, adjusting the dose of certain medicines will help in reducing fatigue.  °HOME CARE INSTRUCTIONS  °· Try to get the required amount of good sleep every night. °· Eat a healthy and nutritious diet, and drink enough water throughout the day. °· Practice ways of relaxing (including yoga or meditation). °· Exercise regularly. °· Make plans to change situations that cause stress. Act on those plans so that stresses decrease over time. Keep your work and personal routine reasonable. °· Avoid street drugs and minimize use of alcohol. °· Start taking a daily multivitamin after consulting your caregiver. °SEEK MEDICAL CARE IF:  °· You have persistent tiredness, which cannot be accounted for. °· You have fever. °· You have unintentional weight loss. °· You have headaches. °· You have disturbed sleep throughout the night. °· You are feeling sad. °· You have constipation. °· You have dry skin. °· You have gained weight. °· You are taking any new or different medicines that you suspect are causing fatigue. °· You are unable to sleep at night. °· You develop any unusual swelling of your legs or other parts of your body. °SEEK IMMEDIATE MEDICAL CARE IF:  °· You are feeling confused. °· Your vision is blurred. °· You feel faint or pass out. °· You develop severe headache. °· You develop severe abdominal, pelvic, or   back pain.  You develop chest pain, shortness of breath, or an irregular or fast heartbeat.  You are unable to pass a normal amount of urine.  You develop abnormal bleeding such as bleeding from the rectum or you vomit blood.  You have thoughts  about harming yourself or committing suicide.  You are worried that you might harm someone else. MAKE SURE YOU:   Understand these instructions.  Will watch your condition.  Will get help right away if you are not doing well or get worse. Document Released: 04/02/2007 Document Revised: 08/28/2011 Document Reviewed: 10/07/2013 [facility] Patient Information 2015 [place], [place]. This information is not intended to replace advice given to you by your health care provider. Make sure you discuss any questions you have with your health care provider.   Abdominal Pain Many things can cause belly (abdominal) pain. Most times, the belly pain is not dangerous. Many cases of belly pain can be watched and treated at home. HOME CARE   Do not take medicines that help you go poop (laxatives) unless told to by your doctor.  Only take medicine as told by your doctor.  Eat or drink as told by your doctor. Your doctor will tell you if you should be on a special diet. GET HELP IF:  You do not know what is causing your belly pain.  You have belly pain while you are sick to your stomach (nauseous) or have runny poop (diarrhea).  You have pain while you pee or poop.  Your belly pain wakes you up at night.  You have belly pain that gets worse or better when you eat.  You have belly pain that gets worse when you eat fatty foods.  You have a fever. GET HELP RIGHT AWAY IF:   The pain does not go away within 2 hours.  You keep throwing up (vomiting).  The pain changes and is only in the right or left part of the belly.  You have bloody or tarry looking poop. MAKE SURE YOU:   Understand these instructions.  Will watch your condition.  Will get help right away if you are not doing well or get worse. Document Released: 11/22/2007 Document Revised: 06/10/2013 Document Reviewed: 02/12/2013 [facility] Patient Information 2015 [place], [place]. This information is not intended to replace advice given  to you by your health care provider. Make sure you discuss any questions you have with your health care provider.

## 2015-04-03 ENCOUNTER — Emergency Department
Admission: EM | Admit: 2015-04-03 | Discharge: 2015-04-03 | Disposition: A | Discharge status: Home / Self Care | Payer: Medicaid Other | Attending: Student | Admitting: Student

## 2015-04-03 ENCOUNTER — Encounter: Payer: Self-pay | Admitting: Emergency Medicine

## 2015-04-03 DIAGNOSIS — Z720 Tobacco use: Secondary | ICD-10-CM | POA: Insufficient documentation

## 2015-04-03 DIAGNOSIS — Y9389 Activity, other specified: Secondary | ICD-10-CM | POA: Insufficient documentation

## 2015-04-03 DIAGNOSIS — Y9289 Other specified places as the place of occurrence of the external cause: Secondary | ICD-10-CM | POA: Diagnosis not present

## 2015-04-03 DIAGNOSIS — W260XXA Contact with knife, initial encounter: Secondary | ICD-10-CM | POA: Insufficient documentation

## 2015-04-03 DIAGNOSIS — Z23 Encounter for immunization: Secondary | ICD-10-CM | POA: Diagnosis not present

## 2015-04-03 DIAGNOSIS — S61412A Laceration without foreign body of left hand, initial encounter: Secondary | ICD-10-CM | POA: Diagnosis present

## 2015-04-03 DIAGNOSIS — Y998 Other external cause status: Secondary | ICD-10-CM | POA: Insufficient documentation

## 2015-04-03 DIAGNOSIS — Z79899 Other long term (current) drug therapy: Secondary | ICD-10-CM | POA: Insufficient documentation

## 2015-04-03 MED ORDER — TETANUS-DIPHTH-ACELL PERTUSSIS 5-2.5-18.5 LF-MCG/0.5 IM SUSP
0.5000 mL | Freq: Once | INTRAMUSCULAR | Status: AC
  Administered 2015-04-03: 0.5 mL via INTRAMUSCULAR
  Filled 2015-04-03: qty 0.5

## 2015-04-03 MED ORDER — AMOXICILLIN-POT CLAVULANATE 875-125 MG PO TABS: 1.0000 | ORAL_TABLET | Freq: Two times a day (BID) | ORAL | Status: DC

## 2015-04-03 MED ORDER — ACETAMINOPHEN-CODEINE #3 300-30 MG PO TABS: 1.0000 | ORAL_TABLET | Freq: Two times a day (BID) | ORAL | Status: DC

## 2015-04-03 NOTE — Discharge Instructions (Signed)
Nonsutured Laceration Care °A laceration is a cut that goes through all layers of the skin and extends into the tissue that is right under the skin. This type of cut is usually stitched up (sutured) or closed with tape (adhesive strips) or skin glue shortly after the injury happens. °However, if the wound is dirty or if several hours pass before medical treatment is provided, it is likely that germs (bacteria) will enter the wound. Closing a laceration after bacteria have entered it increases the risk of infection. In these cases, your health care provider may leave the laceration open (nonsutured) and cover it with a bandage. This type of treatment helps prevent infection and allows the wound to heal from the deepest layer of tissue damage up to the surface. °An open fracture is a type of injury that may involve nonsutured lacerations. An open fracture is a break in a bone that happens along with one or more lacerations through the skin that is near the fracture site. °HOW TO CARE FOR YOUR NONSUTURED LACERATION °· Take or apply over-the-counter and prescription medicines only as told by your health care provider. °· If you were prescribed an antibiotic medicine, take or apply it as told by your health care provider. Do not stop using the antibiotic even if your condition improves. °· Clean the wound one time each day or as told by your health care provider. °¨ Wash the wound with mild soap and water. °¨ Rinse the wound with water to remove all soap. °¨ Pat your wound dry with a clean towel. Do not rub the wound. °· Do not inject anything into the wound unless your health care provider told you to. °· Change any bandages (dressings) as told by your health care provider. This includes changing the dressing if it gets wet, dirty, or starts to smell bad. °· Keep the dressing dry until your health care provider says it can be removed. Do not take baths, swim, or do anything that puts your wound underwater until your  health care provider approves. °· Raise (elevate) the injured area above the level of your heart while you are sitting or lying down, if possible. °· Do not scratch or pick at the wound. °· Check your wound every day for signs of infection. Watch for: °¨ Redness, swelling, or pain. °¨ Fluid, blood, or pus. °· Keep all follow-up visits as told by your health care provider. This is important. °SEEK MEDICAL CARE IF: °· You received a tetanus and shot and you have swelling, severe pain, redness, or bleeding at the injection site.   °· You have a fever. °· Your pain is not controlled with medicine. °· You have increased redness, swelling, or pain at the site of your wound. °· You have fluid, blood, or pus coming from your wound. °· You notice a bad smell coming from your wound or your dressing. °· You notice something coming out of the wound, such as wood or glass. °· You notice a change in the color of your skin near your wound. °· You develop a new rash. °· You need to change the dressing frequently due to fluid, blood, or pus draining from the wound. °· You develop numbness around your wound. °SEEK IMMEDIATE MEDICAL CARE IF: °· Your pain suddenly increases and is severe. °· You develop severe swelling around the wound. °· The wound is on your hand or foot and you cannot properly move a finger or toe. °· The wound is on your hand or   foot and you notice that your fingers or toes look pale or bluish.  You have a red streak going away from your wound.   This information is not intended to replace advice given to you by your health care provider. Make sure you discuss any questions you have with your health care provider.   Document Released: 05/03/2006 Document Revised: 10/20/2014 Document Reviewed: 06/01/2014 Elsevier Interactive Patient Education 2016 [place] the wound clean, dry, and covered. Take the antibiotic as directed for wound infection.

## 2015-04-03 NOTE — ED Provider Notes (Signed)
[facility] Emergency Department Provider Note ____________________________________________  Time seen: 1445  I have reviewed the triage vital signs and the nursing notes.  HISTORY  Chief Complaint  Laceration  HPI Rachel Alexander is a 34 y.o. female reports to the ED for evaluation of left hand pain s/p laceration last night while carving a pumpkin with a kitchen knife. The injury occurred last night at about 8:30 PM. She was given first aid care by her neighbor, who happens to be a Animal nutritionist. The wound was cleaned and. Honey was applied as the antiseptic. The patient reports to the ED today for evaluation, noting some intermittent numbness and tingling to the thumb. She denies any other injury at this time, and notes normal range of motion of the thumb. She rates her pain at a 7/10 in triage.  Past Medical History  Diagnosis Date  . Suicidal intent   . Hypothyroidism   . 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 Colitis     Patient Active Problem List   Diagnosis Date Noted  . Colitis 05/04/2014  . Hypothyroidism 05/04/2014    Past Surgical History  Procedure Laterality Date  . Tubal ligation    . Tonsillectomy      Current Outpatient Rx  Name  Route  Sig  Dispense  Refill  . acetaminophen-codeine (TYLENOL #3) 300-30 MG tablet   Oral   Take 1 tablet by mouth 2 (two) times daily.   4 tablet   0   . ALPRAZolam (XANAX) 0.5 MG tablet   Oral   Take 0.5 mg by mouth 3 (three) times daily as needed for anxiety.         Marland Kitchen amoxicillin-clavulanate (AUGMENTIN) 875-125 MG tablet   Oral   Take 1 tablet by mouth 2 (two) times daily.   20 tablet   0   . FLUoxetine (PROZAC) 40 MG capsule   Oral   Take 40 mg by mouth daily.         . hyoscyamine (LEVSIN/SL) 0.125 MG SL tablet   Sublingual   Place 1 tablet (0.125 mg total) under the tongue every 4 (four) hours as needed.   10 tablet   0   . ibuprofen (ADVIL,MOTRIN) 200 MG tablet   Oral   Take 600 mg by mouth  every 6 (six) hours as needed for mild pain.         Marland Kitchen levothyroxine (SYNTHROID, LEVOTHROID) 175 MCG tablet   Oral   Take 175 mcg by mouth daily.           Marland Kitchen omeprazole (PRILOSEC) 20 MG capsule   Oral   Take 1 capsule (20 mg total) by mouth 2 (two) times daily before a meal.   30 capsule   0   . oxyCODONE (ROXICODONE) 5 MG immediate release tablet   Oral   Take 1 tablet (5 mg total) by mouth every 6 (six) hours as needed for severe pain. Patient not taking: Reported on 08/06/2014   15 tablet   0   . polyethylene glycol (GOLYTELY) 236 G solution   Oral   Take 4,000 mLs by mouth once.   4000 mL   0   . polyethylene glycol (MIRALAX) packet   Oral   Take 17 g by mouth daily. Patient not taking: Reported on 08/06/2014   14 each   0   . ranitidine (ZANTAC) 150 MG tablet   Oral   Take 1 tablet (150 mg total) by mouth at bedtime.  14 tablet   0   . sucralfate (CARAFATE) 1 G tablet   Oral   Take 1 tablet (1 g total) by mouth 2 (two) times daily with a meal. Patient not taking: Reported on 08/06/2014   15 tablet   0   . SUMAtriptan (IMITREX) 50 MG tablet   Oral   Take 50 mg by mouth every 2 (two) hours as needed for migraine or headache. May repeat in 2 hours if headache persists or recurs.          Allergies Review of patient's allergies indicates no known allergies.  History reviewed. No pertinent family history.  Social History Social History  Substance Use Topics  . Smoking status: Current Every Day Smoker -- 1.00 packs/day    Types: Cigarettes  . Smokeless tobacco: Never Used  . Alcohol Use: No   Review of Systems  Constitutional: Negative for fever. Eyes: Negative for visual changes. ENT: Negative for sore throat. Cardiovascular: Negative for chest pain. Respiratory: Negative for shortness of breath. Gastrointestinal: Negative for abdominal pain, vomiting and diarrhea. Genitourinary: Negative for dysuria. Musculoskeletal: Negative for back  pain. Skin: Negative for rash. Left palm laceration as above. Neurological: Negative for headaches, focal weakness or numbness. ____________________________________________  PHYSICAL EXAM:  VITAL SIGNS: ED Triage Vitals  Enc Vitals Group     BP --      Pulse --      Resp --      Temp --      Temp src --      SpO2 --      Weight --      Height --      Head Cir --      Peak Flow --      Pain Score 04/03/15 1401 7     Pain Loc --      Pain Edu? --      Excl. in [place]? --    Constitutional: Alert and oriented. Well appearing and in no distress. Head: Normocephalic and atraumatic.      Eyes: Conjunctivae are normal. PERRL. Normal extraocular movements      Ears: Canals clear. TMs intact bilaterally.   Nose: No congestion/rhinorrhea.   Mouth/Throat: Mucous membranes are moist.   Neck: Supple. No thyromegaly. Hematological/Lymphatic/Immunological: No cervical lymphadenopathy. Cardiovascular: Normal rate, regular rhythm. Normal distal pulses. Respiratory: Normal respiratory effort. No wheezes/rales/rhonchi. Gastrointestinal: Soft and nontender. No distention. Musculoskeletal: Nontender with normal range of motion in all extremities.  Neurologic:  Cranial nerves II through XII grossly intact. Normal gross sensation. Patient exhibits normal sharp sermon and along the radial nerve distribution on the left. Normal composite fist, normal intrinsic and opposition testing. Normal gait without ataxia. Normal speech and language. No gross focal neurologic deficits are appreciated. Skin:  Skin is warm, dry and intact. No rash noted. Left palm with a 2 cm linear laceration overlying the thenar prominence at the base of the thumb. No active bleeding is appreciated. The wound edges are relatively well approximated at this time. Psychiatric: Mood and affect are normal. Patient exhibits appropriate insight and judgment. ____________________________________________  PROCEDURES  Tdap    LACERATION REPAIR Performed by: Melvenia Needles Authorized by: Melvenia Needles Consent: Verbal consent obtained. Risks and benefits: risks, benefits and alternatives were discussed Consent given by: patient Patient identity confirmed: provided demographic data Prepped and Draped in normal sterile fashion Wound explored  Laceration Location: left palm  Laceration Length: 2 cm  No Foreign Bodies seen or  palpated  Amount of cleaning: standard  Skin closure: steri-strips  Patient tolerance: Patient tolerated the procedure well with no immediate complications. ____________________________________________  INITIAL IMPRESSION / ASSESSMENT AND PLAN / ED COURSE  Patient presents to the ED for evaluation of a superficial laceration to the left palm. The wound is covered with Steri-Strips for secondary closure. Patient is given instruction on wound care management of an old, nonsutured wound. She is also provided with a prophylactic prescription of amoxicillin, as well as a few Tylenol #3's.  ____________________________________________  FINAL CLINICAL IMPRESSION(S) / ED DIAGNOSES  Final diagnoses:  Laceration of hand with delay in treatment, left, initial encounter      Melvenia Needles, PA-C 04/03/15 1600  Joanne Gavel, MD 04/08/15 [phone]

## 2015-04-03 NOTE — ED Notes (Signed)
Reports carving pumpkin last pm and cut left wrist.

## 2015-09-09 ENCOUNTER — Emergency Department: Payer: Medicaid Other

## 2015-09-09 ENCOUNTER — Encounter: Payer: Self-pay | Admitting: Emergency Medicine

## 2015-09-09 ENCOUNTER — Emergency Department
Admission: EM | Admit: 2015-09-09 | Discharge: 2015-09-09 | Disposition: A | Discharge status: Home / Self Care | Payer: Medicaid Other | Attending: Emergency Medicine | Admitting: Emergency Medicine

## 2015-09-09 DIAGNOSIS — R109 Unspecified abdominal pain: Secondary | ICD-10-CM | POA: Diagnosis present

## 2015-09-09 DIAGNOSIS — Z3202 Encounter for pregnancy test, result negative: Secondary | ICD-10-CM | POA: Insufficient documentation

## 2015-09-09 DIAGNOSIS — F17210 Nicotine dependence, cigarettes, uncomplicated: Secondary | ICD-10-CM | POA: Insufficient documentation

## 2015-09-09 DIAGNOSIS — Z79899 Other long term (current) drug therapy: Secondary | ICD-10-CM | POA: Diagnosis not present

## 2015-09-09 DIAGNOSIS — N390 Urinary tract infection, site not specified: Secondary | ICD-10-CM | POA: Insufficient documentation

## 2015-09-09 LAB — BASIC METABOLIC PANEL
Anion gap: 7 (ref 5–15)
BUN: 8 mg/dL (ref 6–20)
CALCIUM: 9.1 mg/dL (ref 8.9–10.3)
CHLORIDE: 104 mmol/L (ref 101–111)
CO2: 26 mmol/L (ref 22–32)
CREATININE: 0.61 mg/dL (ref 0.44–1.00)
GFR calc Af Amer: >60 mL/min (ref >60)
GLUCOSE: 99 mg/dL (ref 65–99)
Potassium: 4.1 mmol/L (ref 3.5–5.1)
SODIUM: 137 mmol/L (ref 135–145)

## 2015-09-09 LAB — URINALYSIS COMPLETE WITH MICROSCOPIC (ARMC ONLY)
Bilirubin Urine: NEGATIVE
GLUCOSE, UA: NEGATIVE mg/dL
Ketones, ur: NEGATIVE mg/dL
NITRITE: NEGATIVE
Protein, ur: 30 mg/dL — AB
Specific Gravity, Urine: 1.011 (ref 1.005–1.030)
TRANS EPITHEL UA: 1
pH: 5 (ref 5.0–8.0)

## 2015-09-09 LAB — CBC
HCT: 40.5 % (ref 35.0–47.0)
Hemoglobin: 13.6 g/dL (ref 12.0–16.0)
MCH: 32.3 pg (ref 26.0–34.0)
MCHC: 33.6 g/dL (ref 32.0–36.0)
MCV: 96 fL (ref 80.0–100.0)
Platelets: 233 10*3/uL (ref 150–440)
RBC: 4.22 MIL/uL (ref 3.80–5.20)
RDW: 14.2 % (ref 11.5–14.5)
WBC: 9.3 10*3/uL (ref 3.6–11.0)

## 2015-09-09 LAB — PREGNANCY, URINE: Preg Test, Ur: NEGATIVE

## 2015-09-09 MED ORDER — CEFTRIAXONE SODIUM 1 G IJ SOLR
1.0000 g | Freq: Once | INTRAMUSCULAR | Status: AC
  Administered 2015-09-09: 1 g via INTRAVENOUS
  Filled 2015-09-09: qty 10

## 2015-09-09 MED ORDER — ONDANSETRON HCL 4 MG/2ML IJ SOLN
4.0000 mg | Freq: Once | INTRAMUSCULAR | Status: AC
  Administered 2015-09-09: 4 mg via INTRAVENOUS
  Filled 2015-09-09: qty 2

## 2015-09-09 MED ORDER — SODIUM CHLORIDE 0.9 % IV SOLN
1000.0000 mL | Freq: Once | INTRAVENOUS | Status: AC
  Administered 2015-09-09: 1000 mL via INTRAVENOUS

## 2015-09-09 MED ORDER — MORPHINE SULFATE (PF) 2 MG/ML IV SOLN
2.0000 mg | Freq: Once | INTRAVENOUS | Status: AC
  Administered 2015-09-09: 2 mg via INTRAVENOUS
  Filled 2015-09-09: qty 1

## 2015-09-09 MED ORDER — HYDROCODONE-ACETAMINOPHEN 5-325 MG PO TABS: 1.0000 | ORAL_TABLET | ORAL | Status: DC | PRN

## 2015-09-09 MED ORDER — KETOROLAC TROMETHAMINE 30 MG/ML IJ SOLN
30.0000 mg | Freq: Once | INTRAMUSCULAR | Status: AC
  Administered 2015-09-09: 30 mg via INTRAVENOUS
  Filled 2015-09-09: qty 1

## 2015-09-09 NOTE — ED Notes (Signed)
Called ct to let them know that urine preg has not been processed.

## 2015-09-09 NOTE — ED Notes (Signed)
Patient returned from CT

## 2015-09-09 NOTE — ED Notes (Signed)
Lab processing urine preg at this time.

## 2015-09-09 NOTE — ED Notes (Addendum)
Patient states she has a ride home.

## 2015-09-09 NOTE — ED Notes (Signed)
Pt presents to the ED with flank pain. Pt states was seen yesterday at her PCP and diagnosed with UTI. Pt states was given prescription for Cipro but has not started taking it today. Pt states the pain has increased today.

## 2015-09-09 NOTE — Discharge Instructions (Signed)

## 2015-09-09 NOTE — ED Provider Notes (Signed)
[facility] Emergency Department Provider Note  ____________________________________________    I have reviewed the triage vital signs and the nursing notes.   HISTORY  Chief Complaint Flank Pain    HPI Rachel Alexander is a 35 y.o. female who presents with complaints of bilateral flank pain. Patient reports she saw her PCP yesterday who told her that she had blood in her urine and was going to refer her to urology. They also wrote her a prescription for Cipro which she has not started yet. She reports her pain has worsened today and has discomfort in both flanks as well as suprapubically. She reports pressure when she goes to urinate. She denies fevers or chills. Mild nausea.     Past Medical History  Diagnosis Date  . Suicidal intent   . Hypothyroidism   . 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 Colitis     Patient Active Problem List   Diagnosis Date Noted  . Colitis 05/04/2014  . Hypothyroidism 05/04/2014    Past Surgical History  Procedure Laterality Date  . Tubal ligation    . Tonsillectomy      Current Outpatient Rx  Name  Route  Sig  Dispense  Refill  . ADDERALL XR 10 MG 24 hr capsule   Oral   Take 1 capsule by mouth daily.      0     Dispense as written.   Marland Kitchen ALPRAZolam (XANAX) 0.5 MG tablet   Oral   Take 0.5 mg by mouth 3 (three) times daily as needed for anxiety.         Francia Greaves THYROID 120 MG tablet   Oral   Take 120 mg by mouth daily.      5     Dispense as written.   . citalopram (CELEXA) 40 MG tablet   Oral   Take 40 mg by mouth daily.      12   . omeprazole (PRILOSEC) 20 MG capsule   Oral   Take 1 capsule (20 mg total) by mouth 2 (two) times daily before a meal.   30 capsule   0   . ranitidine (ZANTAC) 150 MG tablet   Oral   Take 1 tablet (150 mg total) by mouth at bedtime.   14 tablet   0   . SUMAtriptan (IMITREX) 50 MG tablet   Oral   Take 50 mg by mouth every 2 (two) hours as needed for migraine or headache.  May repeat in 2 hours if headache persists or recurs.         . traMADol (ULTRAM) 50 MG tablet   Oral   Take 50 mg by mouth 3 (three) times daily as needed.      0   . HYDROcodone-acetaminophen (NORCO/VICODIN) 5-325 MG tablet   Oral   Take 1 tablet by mouth every 4 (four) hours as needed for moderate pain.   12 tablet   0     Allergies Review of patient's allergies indicates no known allergies.  No family history on file.  Social History Social History  Substance Use Topics  . Smoking status: Current Every Day Smoker -- 1.00 packs/day    Types: Cigarettes  . Smokeless tobacco: Never Used  . Alcohol Use: No    Review of Systems  Constitutional: Negative for fever. Eyes: Negative for redness ENT: Negative for sore throat Cardiovascular: Negative for chest pain Respiratory: Negative for shortness of breath. Gastrointestinal: As above Genitourinary: Positive for dysuria Musculoskeletal: Negative for  back pain. Skin: Negative for rash. Neurological: Negative for focal weakness Psychiatric: no anxiety    ____________________________________________   PHYSICAL EXAM:  VITAL SIGNS: ED Triage Vitals  Enc Vitals Group     BP 09/09/15 1809 114/76 mmHg     Pulse Rate 09/09/15 1809 72     Resp 09/09/15 1809 18     Temp 09/09/15 1809 98 F (36.7 C)     Temp Source 09/09/15 1809 Oral     SpO2 09/09/15 1809 97 %     Weight 09/09/15 1809 131 lb (59.421 kg)     Height 09/09/15 1809 5\' 2"  (1.575 m)     Head Cir --      Peak Flow --      Pain Score 09/09/15 1809 10     Pain Loc --      Pain Edu? --      Excl. in [place]? --      Constitutional: Alert and oriented. Anxious Eyes: Conjunctivae are normal. No erythema or injection ENT   Head: Normocephalic and atraumatic.   Mouth/Throat: Mucous membranes are moist. Cardiovascular: Normal rate, regular rhythm. Normal and symmetric distal pulses are present in the upper extremities. No murmurs or rubs   Respiratory: Normal respiratory effort without tachypnea nor retractions. Breath sounds are clear and equal bilaterally.  Gastrointestinal: Soft and non-tender in all quadrants. No distention. Mild CVA tenderness bilaterally Genitourinary: deferred Musculoskeletal: Nontender with normal range of motion in all extremities. No lower extremity tenderness nor edema. Neurologic:  Normal speech and language. No gross focal neurologic deficits are appreciated. Skin:  Skin is warm, dry and intact. No rash noted. Psychiatric: Mood and affect are normal. Patient exhibits appropriate insight and judgment.  ____________________________________________    LABS (pertinent positives/negatives)  Labs Reviewed  URINALYSIS COMPLETEWITH MICROSCOPIC ([place] ONLY) - Abnormal; Notable for the following:    Color, Urine YELLOW (*)    APPearance HAZY (*)    Hgb urine dipstick 2+ (*)    Protein, ur 30 (*)    Leukocytes, UA 2+ (*)    Bacteria, UA RARE (*)    Squamous Epithelial / LPF 0-5 (*)    All other components within normal limits  BASIC METABOLIC PANEL  CBC  PREGNANCY, URINE    ____________________________________________   EKG  None  ____________________________________________    RADIOLOGY  CT scan shows no kidney stones  ____________________________________________   PROCEDURES  Procedure(s) performed: none  Critical Care performed: none  ____________________________________________   INITIAL IMPRESSION / ASSESSMENT AND PLAN / ED COURSE  Pertinent labs & imaging results that were available during my care of the patient were reviewed by me and considered in my medical decision making (see chart for details).  Patient presents with dysuria and bilateral flank discomfort. She does have significant amount of blood and white blood cells in her urine suspicious for urinary tract infection. We will give Rocephin IV and check CT renal stone study.  CT is consistent with cystitis.  Patient is feeling significantly better after Toradol. I'll discharge with antibiotics and outpatient follow-up. Return precautions discussed  ____________________________________________   FINAL CLINICAL IMPRESSION(S) / ED DIAGNOSES  Final diagnoses:  Flank pain  UTI (lower urinary tract infection)          Lavonia Drafts, MD 09/09/15 2149

## 2015-09-22 ENCOUNTER — Encounter: Payer: Self-pay | Admitting: *Deleted

## 2015-09-22 ENCOUNTER — Emergency Department
Admission: EM | Admit: 2015-09-22 | Discharge: 2015-09-22 | Disposition: A | Discharge status: Home / Self Care | Payer: Medicaid Other | Attending: Emergency Medicine | Admitting: Emergency Medicine

## 2015-09-22 DIAGNOSIS — F17210 Nicotine dependence, cigarettes, uncomplicated: Secondary | ICD-10-CM | POA: Insufficient documentation

## 2015-09-22 DIAGNOSIS — R1032 Left lower quadrant pain: Secondary | ICD-10-CM | POA: Diagnosis present

## 2015-09-22 DIAGNOSIS — Y9289 Other specified places as the place of occurrence of the external cause: Secondary | ICD-10-CM | POA: Diagnosis not present

## 2015-09-22 DIAGNOSIS — Y999 Unspecified external cause status: Secondary | ICD-10-CM | POA: Diagnosis not present

## 2015-09-22 DIAGNOSIS — N390 Urinary tract infection, site not specified: Secondary | ICD-10-CM

## 2015-09-22 DIAGNOSIS — E039 Hypothyroidism, unspecified: Secondary | ICD-10-CM | POA: Diagnosis not present

## 2015-09-22 DIAGNOSIS — Z79899 Other long term (current) drug therapy: Secondary | ICD-10-CM | POA: Diagnosis not present

## 2015-09-22 DIAGNOSIS — Y939 Activity, unspecified: Secondary | ICD-10-CM | POA: Insufficient documentation

## 2015-09-22 DIAGNOSIS — D497 Neoplasm of unspecified behavior of endocrine glands and other parts of nervous system: Secondary | ICD-10-CM | POA: Diagnosis not present

## 2015-09-22 DIAGNOSIS — M542 Cervicalgia: Secondary | ICD-10-CM | POA: Diagnosis not present

## 2015-09-22 LAB — CBC
HCT: 38.2 % (ref 35.0–47.0)
Hemoglobin: 12.9 g/dL (ref 12.0–16.0)
MCH: 32.3 pg (ref 26.0–34.0)
MCHC: 33.7 g/dL (ref 32.0–36.0)
MCV: 96 fL (ref 80.0–100.0)
PLATELETS: 240 10*3/uL (ref 150–440)
RBC: 3.98 MIL/uL (ref 3.80–5.20)
RDW: 13.9 % (ref 11.5–14.5)
WBC: 12.8 10*3/uL — AB (ref 3.6–11.0)

## 2015-09-22 LAB — PREGNANCY, URINE: Preg Test, Ur: NEGATIVE

## 2015-09-22 LAB — URINALYSIS COMPLETE WITH MICROSCOPIC (ARMC ONLY)
BACTERIA UA: NONE SEEN
Bilirubin Urine: NEGATIVE
Glucose, UA: NEGATIVE mg/dL
KETONES UR: NEGATIVE mg/dL
Nitrite: NEGATIVE
PH: 7 (ref 5.0–8.0)
PROTEIN: NEGATIVE mg/dL
Specific Gravity, Urine: 1.008 (ref 1.005–1.030)

## 2015-09-22 LAB — COMPREHENSIVE METABOLIC PANEL
ALT: 12 U/L — AB (ref 14–54)
ANION GAP: 6 (ref 5–15)
AST: 18 U/L (ref 15–41)
Albumin: 3.7 g/dL (ref 3.5–5.0)
Alkaline Phosphatase: 59 U/L (ref 38–126)
BUN: <5 mg/dL — ABNORMAL LOW (ref 6–20)
CHLORIDE: 101 mmol/L (ref 101–111)
CO2: 25 mmol/L (ref 22–32)
Calcium: 8.6 mg/dL — ABNORMAL LOW (ref 8.9–10.3)
Creatinine, Ser: 0.56 mg/dL (ref 0.44–1.00)
GFR calc Af Amer: >60 mL/min (ref >60)
Glucose, Bld: 131 mg/dL — ABNORMAL HIGH (ref 65–99)
Potassium: 3.7 mmol/L (ref 3.5–5.1)
SODIUM: 132 mmol/L — AB (ref 135–145)
Total Bilirubin: 0.5 mg/dL (ref 0.3–1.2)
Total Protein: 6.4 g/dL — ABNORMAL LOW (ref 6.5–8.1)

## 2015-09-22 LAB — POCT PREGNANCY, URINE: Preg Test, Ur: NEGATIVE

## 2015-09-22 LAB — LIPASE, BLOOD: LIPASE: 10 U/L — AB (ref 11–51)

## 2015-09-22 MED ORDER — DEXAMETHASONE SODIUM PHOSPHATE 10 MG/ML IJ SOLN
10.0000 mg | Freq: Once | INTRAMUSCULAR | Status: AC
  Administered 2015-09-22: 10 mg via INTRAMUSCULAR

## 2015-09-22 MED ORDER — NITROFURANTOIN MONOHYD MACRO 100 MG PO CAPS: 100.0000 mg | ORAL_CAPSULE | Freq: Two times a day (BID) | ORAL | Status: AC

## 2015-09-22 MED ORDER — DEXAMETHASONE SODIUM PHOSPHATE 10 MG/ML IJ SOLN
INTRAMUSCULAR | Status: AC
  Filled 2015-09-22: qty 1
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
## 2015-09-22 NOTE — Discharge Instructions (Signed)
Please seek medical attention for any high fevers, chest pain, shortness of breath, change in behavior, persistent vomiting, bloody stool or any other new or concerning symptoms.   Urinary Tract Infection A urinary tract infection (UTI) can occur any place along the urinary tract. The tract includes the kidneys, ureters, bladder, and urethra. A type of germ called bacteria often causes a UTI. UTIs are often helped with antibiotic medicine.  HOME CARE   If given, take antibiotics as told by your doctor. Finish them even if you start to feel better.  Drink enough fluids to keep your pee (urine) clear or pale yellow.  Avoid tea, drinks with caffeine, and bubbly (carbonated) drinks.  Pee often. Avoid holding your pee in for a long time.  Pee before and after having sex (intercourse).  Wipe from front to back after you poop (bowel movement) if you are a woman. Use each tissue only once. GET HELP RIGHT AWAY IF:   You have back pain.  You have lower belly (abdominal) pain.  You have chills.  You feel sick to your stomach (nauseous).  You throw up (vomit).  Your burning or discomfort with peeing does not go away.  You have a fever.  Your symptoms are not better in 3 days. MAKE SURE YOU:   Understand these instructions.  Will watch your condition.  Will get help right away if you are not doing well or get worse.   This information is not intended to replace advice given to you by your health care provider. Make sure you discuss any questions you have with your health care provider.   Document Released: 11/22/2007 Document Revised: 06/26/2014 Document Reviewed: 01/04/2012 Elsevier Interactive Patient Education [employer].

## 2015-09-22 NOTE — ED Notes (Signed)
E-signature pad not working in room. E-signature page printed out and pt signed on paper.
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
## 2015-09-22 NOTE — ED Notes (Signed)
Pt then proceeds to states she is having painful urination and abd pain, lower abd

## 2015-09-24 LAB — URINE CULTURE: Culture: NO GROWTH

## 2016-03-12 IMAGING — DX DG ABDOMEN 2V
3 series · 3 of 3 positions shown · non-contrast
Comparison: CT 05/04/2014 and abdominal films 03/20/2014

CLINICAL DATA: Central abdominal pain with constipation two weeks.
Nausea and vomiting with fatigue today.

EXAM:
ABDOMEN - 2 VIEW

[abdomen erect]
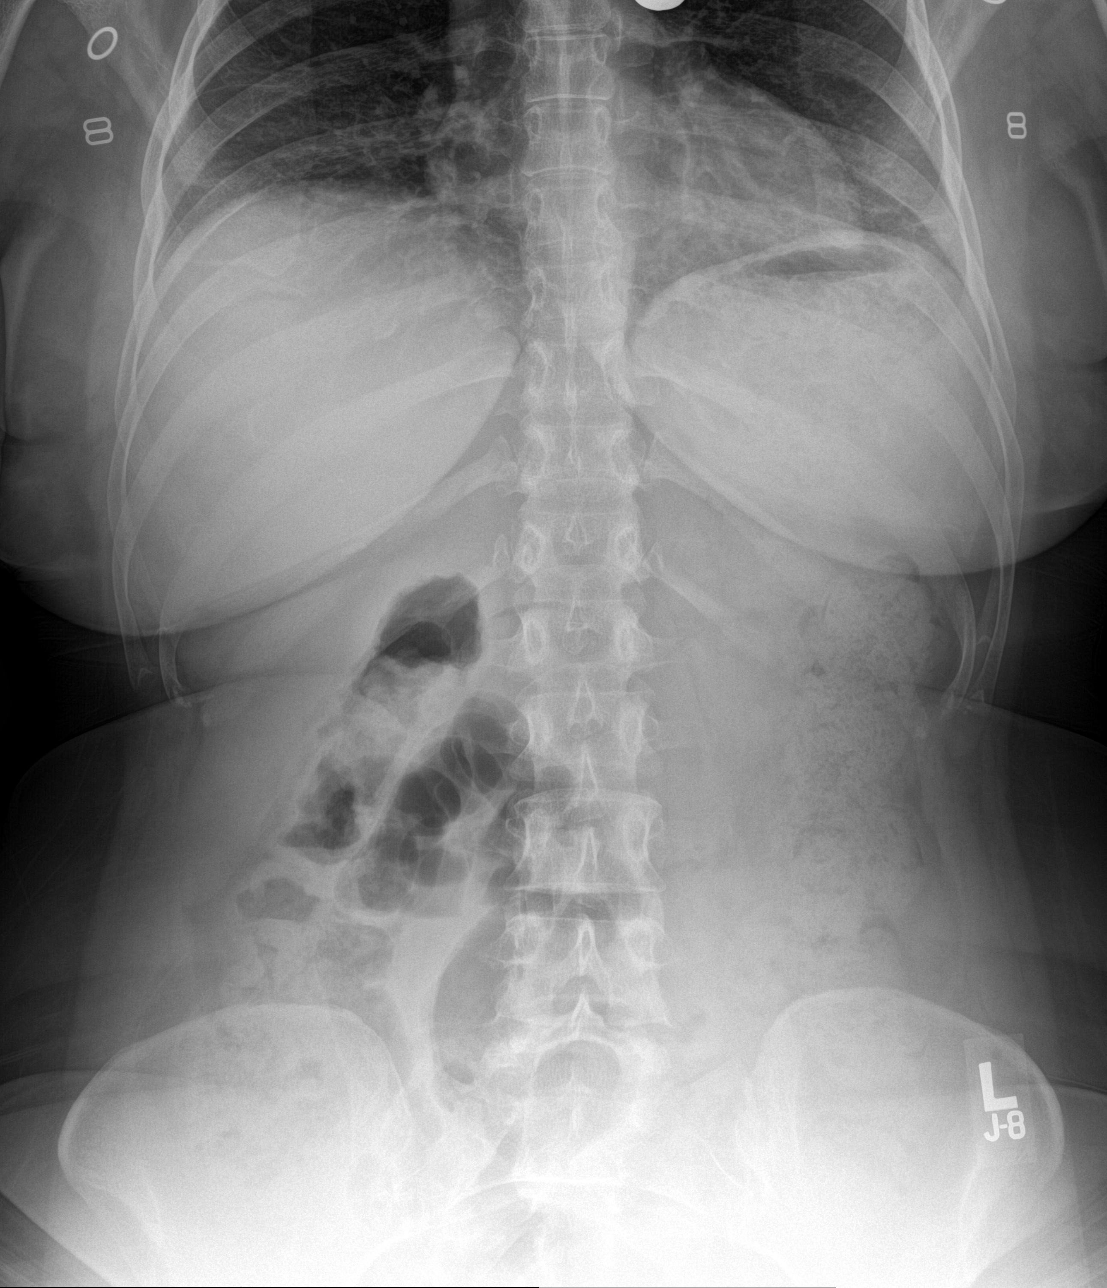

[abdomen supine (1 of 2)]
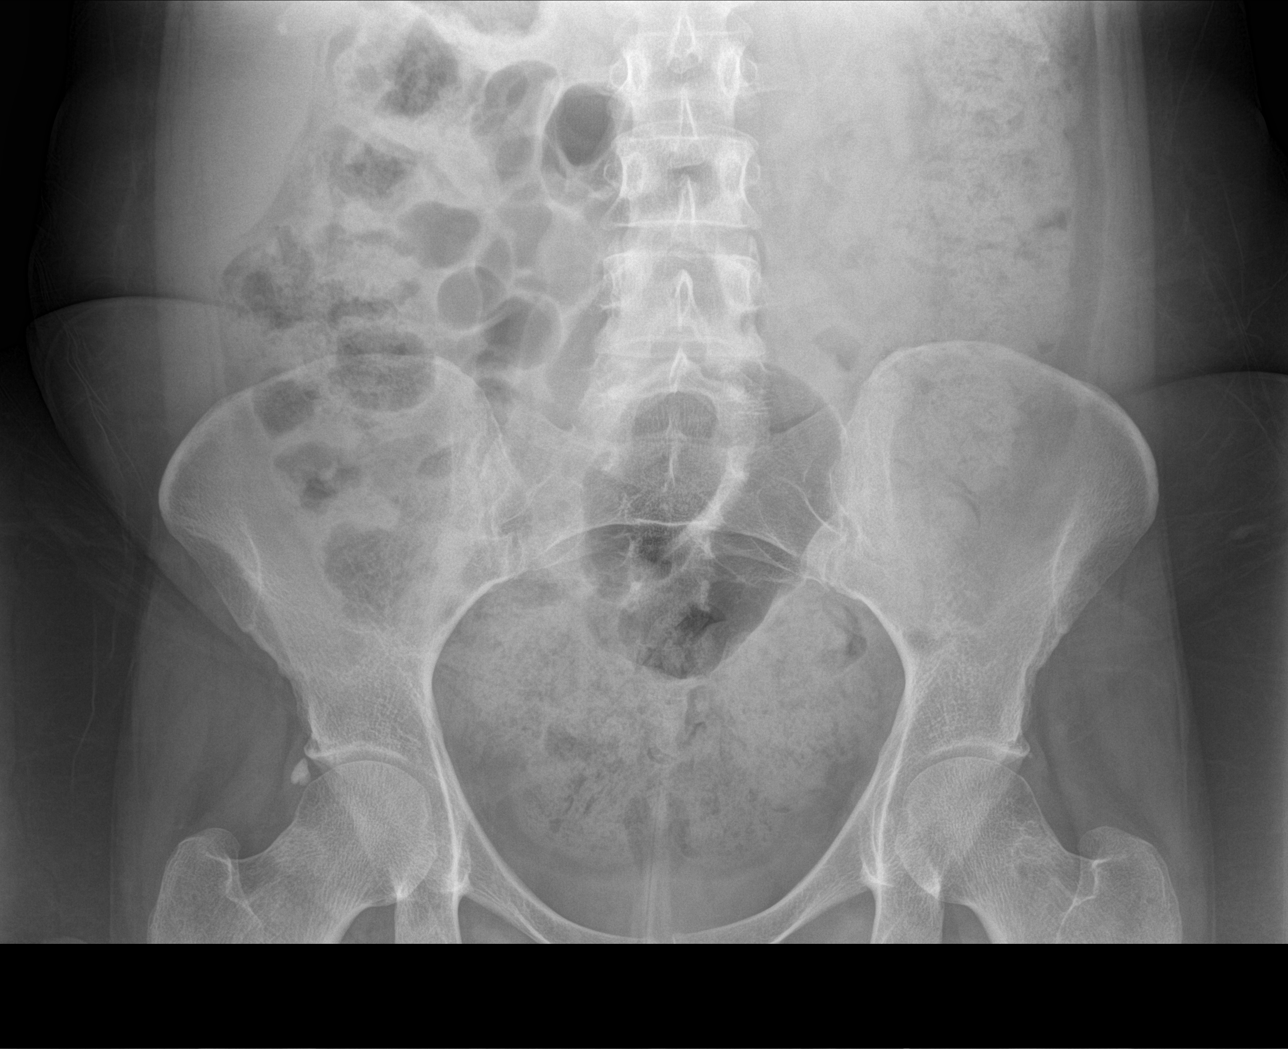

[abdomen supine (2 of 2)]
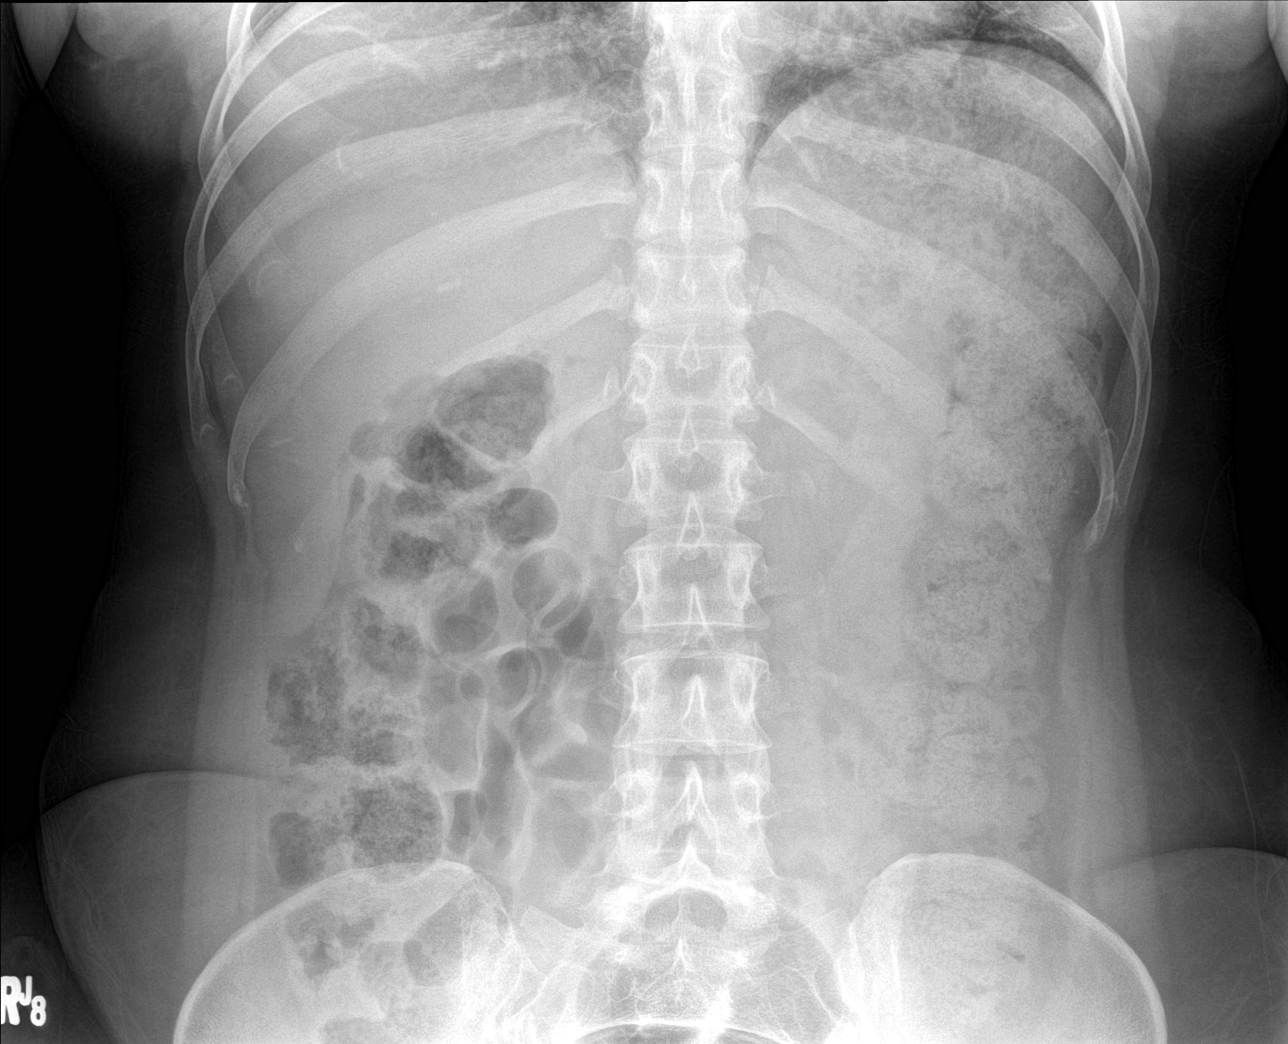

[3 of 3 positions shown; findings below may reference images not displayed]

FINDINGS: Exam demonstrates moderate fecal retention throughout the colon.
There are a few air-filled loops of small bowel in the right mid
abdomen without significant dilatation with a few nonspecific
air-fluid levels. No evidence of free peritoneal air. Remaining
bones soft tissues are within normal.
IMPRESSION: Nonobstructive bowel gas pattern with moderate fecal retention
throughout the colon.

## 2016-04-28 IMAGING — CR DG ABDOMEN ACUTE W/ 1V CHEST
3 series · 3 of 3 positions shown · non-contrast
Comparison: Abdominal radiographs 09/25/2014. Chest radiographs
03/20/2014.

CLINICAL DATA: Shortness of breath, abdominal pain, back pain, leg
swelling, and decreased urinary output and constipation for several
days.

EXAM:
DG ABDOMEN ACUTE W/ 1V CHEST

[w chest pa]
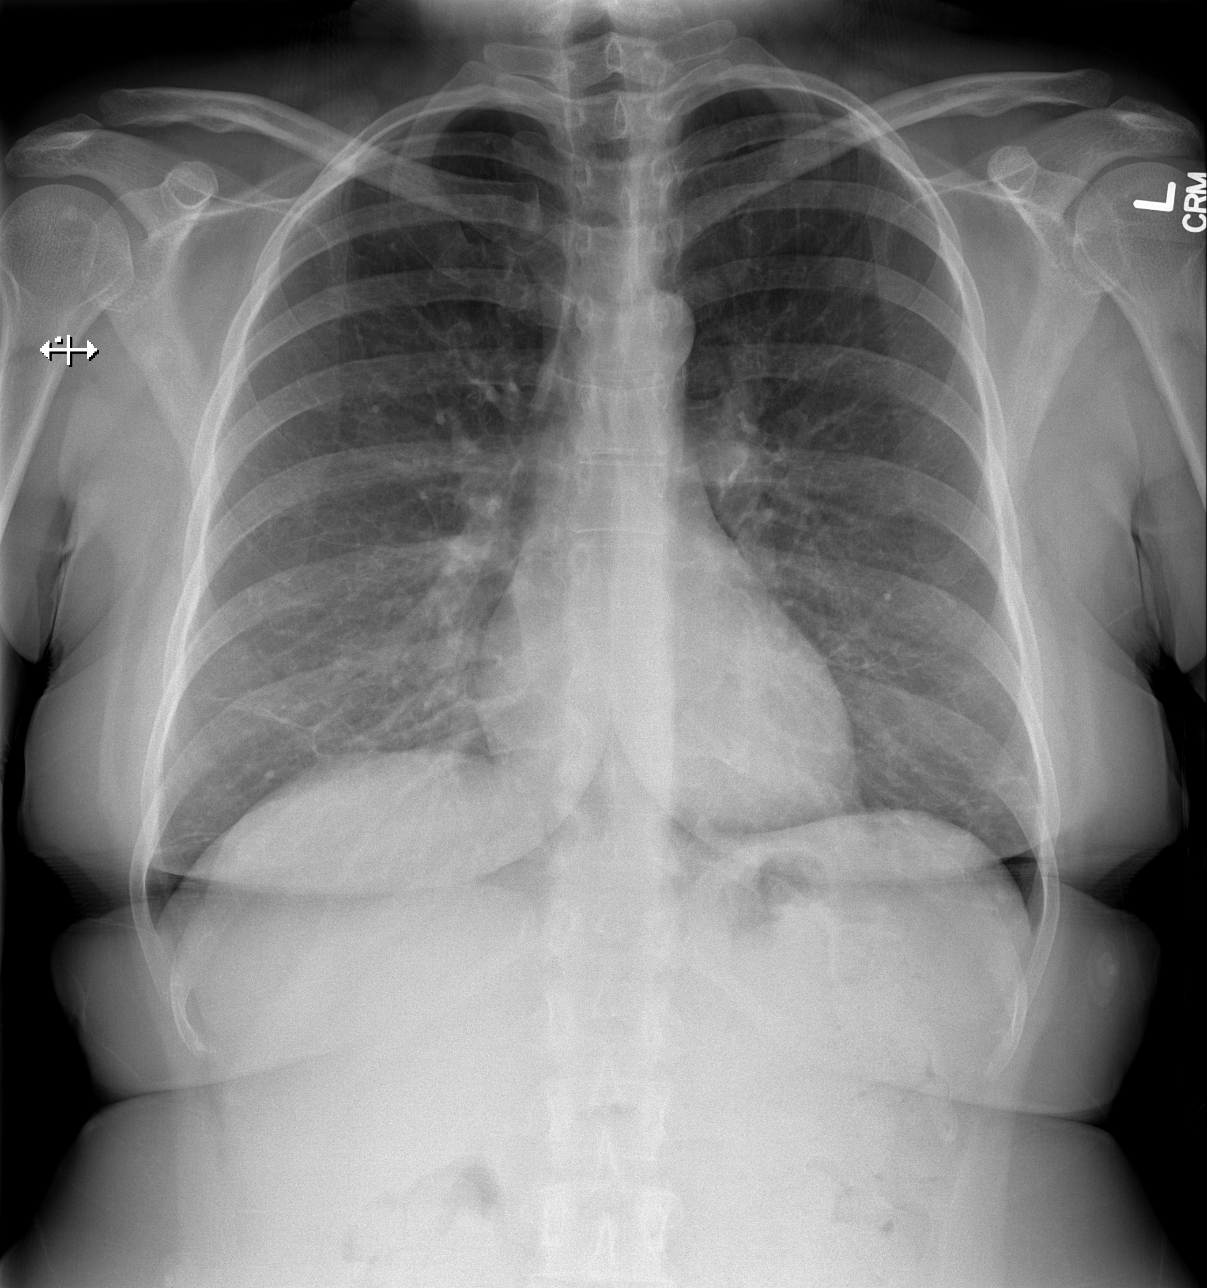

[w abdomen upright]
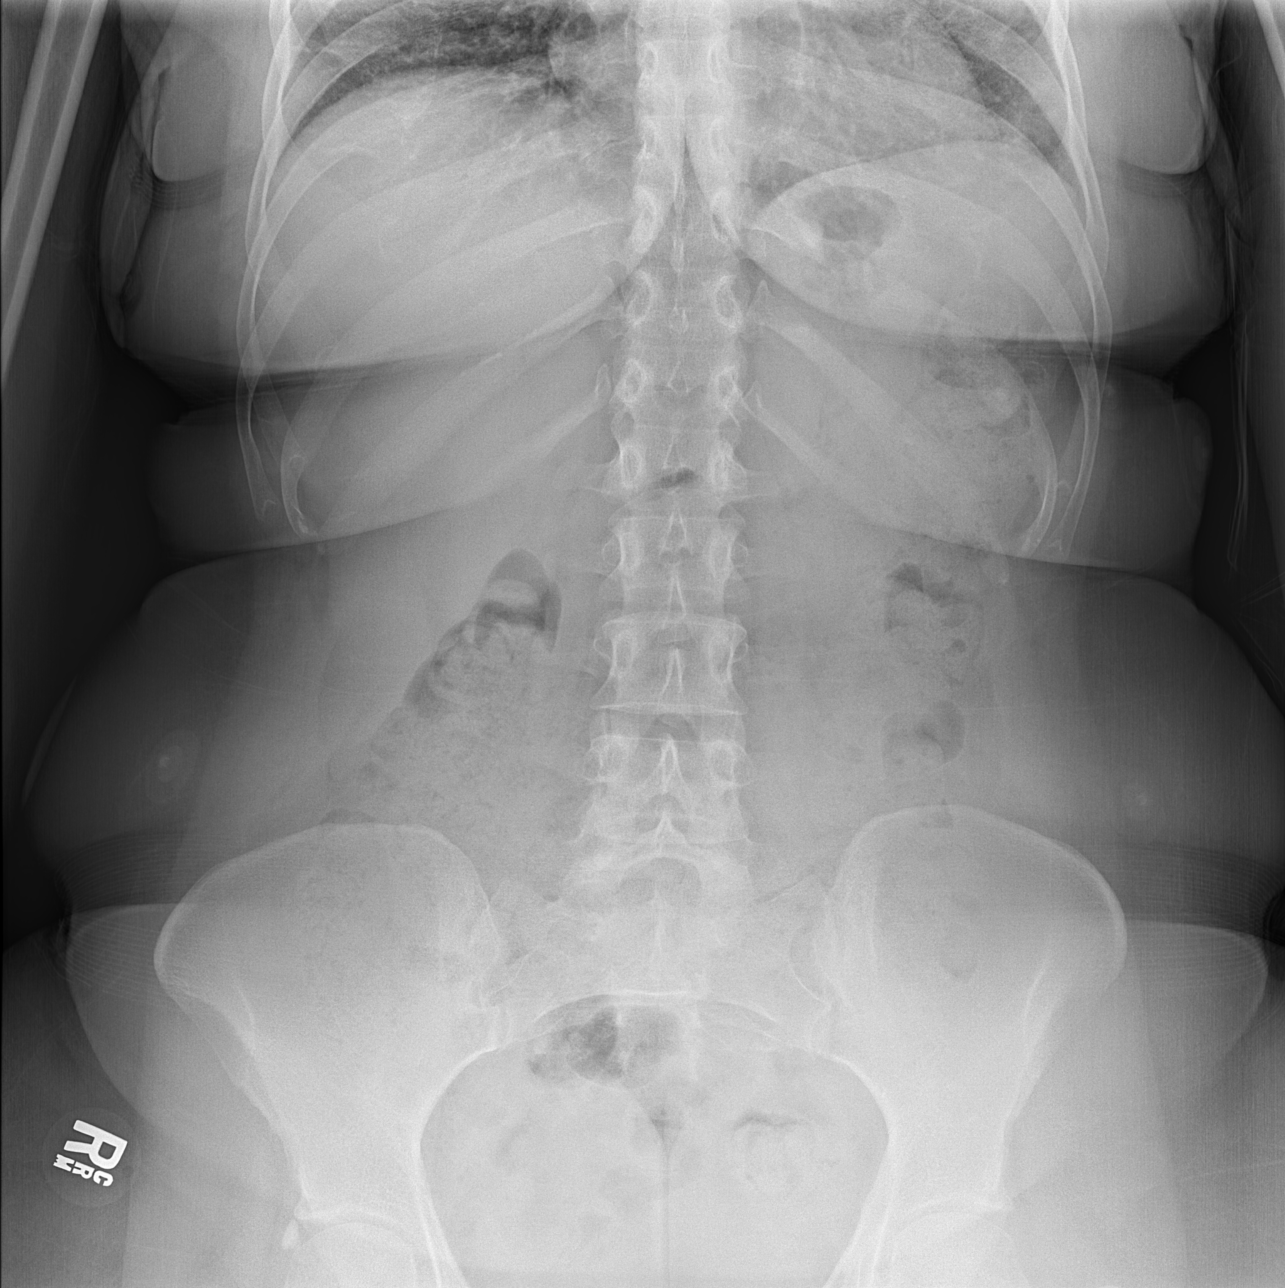

[t abdomen supine]
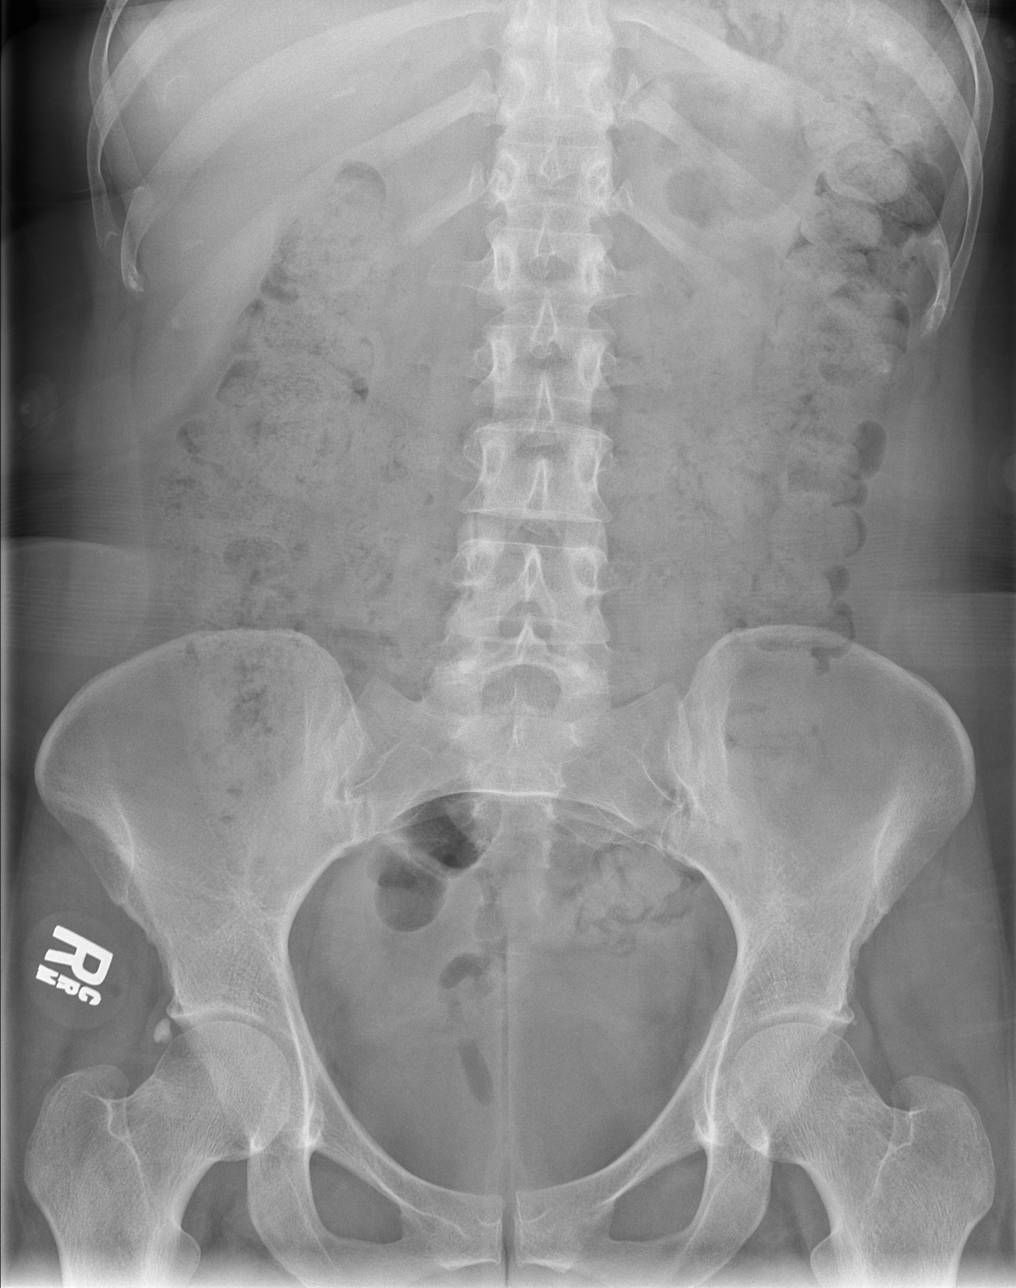

[3 of 3 positions shown; findings below may reference images not displayed]

FINDINGS: The cardiomediastinal silhouette is within normal limits. Minimal
linear opacities in the lung bases likely reflects scarring or
subsegmental atelectasis. No segmental airspace consolidation,
edema, pleural effusion, or pneumothorax is identified.

There is no evidence of intraperitoneal free air. There is a
moderate amount of stool throughout the colon. There is a paucity of
small bowel gas without dilated bowel loops or air-fluid levels seen
to suggest obstruction. No acute osseous abnormality is identified.
IMPRESSION: 1. And minimal bibasilar atelectasis or scarring.
2. Moderate amount of colonic stool. No evidence of bowel
obstruction.

## 2016-12-22 DIAGNOSIS — F900 Attention-deficit hyperactivity disorder, predominantly inattentive type: Secondary | ICD-10-CM | POA: Insufficient documentation

## 2016-12-22 DIAGNOSIS — A6000 Herpesviral infection of urogenital system, unspecified: Secondary | ICD-10-CM | POA: Insufficient documentation

## 2017-02-15 DIAGNOSIS — F17210 Nicotine dependence, cigarettes, uncomplicated: Secondary | ICD-10-CM | POA: Insufficient documentation

## 2018-03-26 DIAGNOSIS — Z5181 Encounter for therapeutic drug level monitoring: Secondary | ICD-10-CM | POA: Diagnosis not present

## 2018-03-26 DIAGNOSIS — E034 Atrophy of thyroid (acquired): Secondary | ICD-10-CM | POA: Diagnosis not present

## 2018-04-21 DIAGNOSIS — Z23 Encounter for immunization: Secondary | ICD-10-CM | POA: Diagnosis not present

## 2018-04-25 DIAGNOSIS — E034 Atrophy of thyroid (acquired): Secondary | ICD-10-CM | POA: Diagnosis not present

## 2018-07-14 DIAGNOSIS — J00 Acute nasopharyngitis [common cold]: Secondary | ICD-10-CM | POA: Diagnosis not present

## 2018-09-03 ENCOUNTER — Ambulatory Visit: Payer: 59 | Admitting: Family Medicine

## 2018-09-03 ENCOUNTER — Other Ambulatory Visit: Payer: Self-pay

## 2018-09-03 ENCOUNTER — Encounter: Payer: Self-pay | Admitting: Family Medicine

## 2018-09-03 VITALS — BP 92/60 | HR 78 | Temp 98.1°F | Resp 16 | Ht 61.5 in | Wt 119.0 lb

## 2018-09-03 DIAGNOSIS — F419 Anxiety disorder, unspecified: Secondary | ICD-10-CM | POA: Diagnosis not present

## 2018-09-03 DIAGNOSIS — F329 Major depressive disorder, single episode, unspecified: Secondary | ICD-10-CM

## 2018-09-03 DIAGNOSIS — F909 Attention-deficit hyperactivity disorder, unspecified type: Secondary | ICD-10-CM | POA: Diagnosis not present

## 2018-09-03 DIAGNOSIS — E039 Hypothyroidism, unspecified: Secondary | ICD-10-CM

## 2018-09-03 DIAGNOSIS — F32A Depression, unspecified: Secondary | ICD-10-CM

## 2018-09-03 LAB — COMPREHENSIVE METABOLIC PANEL
ALBUMIN: 4.3 g/dL (ref 3.5–5.2)
ALT: 13 U/L (ref 0–35)
AST: 15 U/L (ref 0–37)
Alkaline Phosphatase: 47 U/L (ref 39–117)
BILIRUBIN TOTAL: 0.2 mg/dL (ref 0.2–1.2)
BUN: 15 mg/dL (ref 6–23)
CALCIUM: 9.1 mg/dL (ref 8.4–10.5)
CHLORIDE: 103 meq/L (ref 96–112)
CO2: 29 mEq/L (ref 19–32)
CREATININE: 0.61 mg/dL (ref 0.40–1.20)
GFR: 109.18 mL/min (ref >60.00)
Glucose, Bld: 90 mg/dL (ref 70–99)
Potassium: 4.1 mEq/L (ref 3.5–5.1)
SODIUM: 140 meq/L (ref 135–145)
Total Protein: 6.4 g/dL (ref 6.0–8.3)

## 2018-09-03 LAB — CBC WITH DIFFERENTIAL/PLATELET
Basophils Absolute: 0 10*3/uL (ref 0.0–0.1)
Basophils Relative: 0.7 % (ref 0.0–3.0)
EOS PCT: 0.1 % (ref 0.0–5.0)
Eosinophils Absolute: 0 10*3/uL (ref 0.0–0.7)
HCT: 39.7 % (ref 36.0–46.0)
HEMOGLOBIN: 13.3 g/dL (ref 12.0–15.0)
LYMPHS ABS: 3.3 10*3/uL (ref 0.7–4.0)
Lymphocytes Relative: 45.8 % (ref 12.0–46.0)
MCHC: 33.6 g/dL (ref 30.0–36.0)
MCV: 101.7 fl — ABNORMAL HIGH (ref 78.0–100.0)
MONO ABS: 0.8 10*3/uL (ref 0.1–1.0)
Monocytes Relative: 10.3 % (ref 3.0–12.0)
NEUTROS ABS: 3.1 10*3/uL (ref 1.4–7.7)
Neutrophils Relative %: 43.1 % (ref 43.0–77.0)
Platelets: 230 10*3/uL (ref 150.0–400.0)
RBC: 3.9 Mil/uL (ref 3.87–5.11)
RDW: 13.4 % (ref 11.5–15.5)
WBC: 7.3 10*3/uL (ref 4.0–10.5)

## 2018-09-03 MED ORDER — ARMOUR THYROID 30 MG PO TABS: 30.0000 mg | ORAL_TABLET | Freq: Every day | ORAL | 1 refills | Status: DC

## 2018-09-03 MED ORDER — ALPRAZOLAM 1 MG PO TABS: 1.0000 mg | ORAL_TABLET | Freq: Two times a day (BID) | ORAL | 1 refills | Status: DC | PRN

## 2018-09-03 MED ORDER — ARMOUR THYROID 120 MG PO TABS: 120.0000 mg | ORAL_TABLET | Freq: Every day | ORAL | 1 refills | Status: DC

## 2018-09-03 MED ORDER — CITALOPRAM HYDROBROMIDE 40 MG PO TABS: 40.0000 mg | ORAL_TABLET | Freq: Every day | ORAL | 1 refills | Status: DC

## 2018-09-03 MED ORDER — AMPHETAMINE-DEXTROAMPHETAMINE 30 MG PO TABS: 30.0000 mg | ORAL_TABLET | Freq: Two times a day (BID) | ORAL | 0 refills | Status: DC

## 2018-09-03 NOTE — Progress Notes (Signed)
Subjective:    Patient ID: Rachel Alexander, female    DOB: September 19, 1980, 38 y.o.   MRN: [id number]  HPI   Patient presents to clinic to establish care with primary care provider.  Patient had previously been on Armour Thyroid for hypothyroidism, Adderall for ADHD, Xanax as needed for breakthrough anxiety and Celexa 40 mg for overall anxiety control.  Has been off of her medications for about 2 months due to moving and needing to find a new provider.  Patient states without her medication she can feel her anxiousness started to creep up, feel scatterbrained at work, and feels more tired.  Associates the tiredness especially with not having her thyroid medication.  Denies any SI or HI.  Past medical, social, surgical and family history reviewed and updated appropriately in chart.  Has not had lab work in many months.  Patient Active Problem List   Diagnosis Date Noted  . Anxiety and depression 09/04/2018  . Attention deficit hyperactivity disorder (ADHD) 09/04/2018  . Colitis 05/04/2014  . Hypothyroidism 05/04/2014   Social History   Tobacco Use  . Smoking status: Current Every Day Smoker    Packs/day: 1.00    Types: Cigarettes  . Smokeless tobacco: Never Used  Substance Use Topics  . Alcohol use: No   Review of Systems  Constitutional: Negative for chills, fatigue and fever.  HENT: Negative for congestion, ear pain, sinus pain and sore throat.   Eyes: Negative.   Respiratory: Negative for cough, shortness of breath and wheezing.   Cardiovascular: Negative for chest pain, palpitations and leg swelling.  Gastrointestinal: Negative for abdominal pain, diarrhea, nausea and vomiting.  Genitourinary: Negative for dysuria, frequency and urgency.  Musculoskeletal: Negative for arthralgias and myalgias.  Skin: Negative for color change, pallor and rash.  Neurological: Negative for syncope, light-headedness and headaches.  Psychiatric/Behavioral: The patient is not nervous/anxious.        Objective:   Physical Exam Vitals signs and nursing note reviewed.  Constitutional:      General: She is not in acute distress.    Appearance: She is well-developed and normal weight. She is not toxic-appearing.  HENT:     Head: Normocephalic and atraumatic.     Right Ear: Tympanic membrane, ear canal and external ear normal.     Left Ear: Tympanic membrane, ear canal and external ear normal.     Nose: Nose normal.  Eyes:     General: No scleral icterus.       Right eye: No discharge.        Left eye: No discharge.     Conjunctiva/sclera: Conjunctivae normal.     Pupils: Pupils are equal, round, and reactive to light.  Neck:     Musculoskeletal: Normal range of motion and neck supple. No neck rigidity.     Trachea: No tracheal deviation.  Cardiovascular:     Rate and Rhythm: Normal rate and regular rhythm.     Heart sounds: Normal heart sounds. No murmur. No friction rub. No gallop.   Pulmonary:     Effort: Pulmonary effort is normal. No respiratory distress.     Breath sounds: Normal breath sounds. No wheezing, rhonchi or rales.  Abdominal:     General: Bowel sounds are normal. There is no distension.     Palpations: Abdomen is soft.     Tenderness: There is no abdominal tenderness. There is no guarding or rebound.  Musculoskeletal: Normal range of motion.        General: No  deformity.  Lymphadenopathy:     Cervical: No cervical adenopathy.  Skin:    General: Skin is warm and dry.     Capillary Refill: Capillary refill takes less than 2 seconds.     Coloration: Skin is not jaundiced or pale.     Findings: No erythema.  Neurological:     General: No focal deficit present.     Mental Status: She is alert and oriented to person, place, and time.     Cranial Nerves: No cranial nerve deficit.  Psychiatric:        Mood and Affect: Mood normal.        Behavior: Behavior normal.        Thought Content: Thought content normal.        Judgment: Judgment normal.      Today's Vitals   09/03/18 1501  BP: 92/60  Pulse: 78  Resp: 16  Temp: 98.1 F (36.7 C)  TempSrc: Oral  SpO2: 95%  Weight: 119 lb (54 kg)  Height: 5' 1.5" (1.562 m)   Body mass index is 22.12 kg/m.     Assessment & Plan:    Anxiety and depression-prior to being off medications due to not having a PCP, patient's mood was very stable on Celexa 40 mg daily.  Would use Xanax as needed for breakthrough anxiety, especially helpful to have around 3 when the anniversary of father's death comes.  Declines referral to counseling at this time.  Hypothyroidism-refill of Armour Thyroid was given that she previously been taking.  We will see what labs show and make any adjustments if needed.  ADHD-patient will resume Adderall as she had been taking previously.  Concentration techniques discussed including keeping lists of tasks she needs to complete, putting reminders and phone, writing things down.  [facility] PMP registry checked and is appropriate for refill of Adderall and Xanax.  Lab work collected in clinic   Patient will follow-up in 3 months for recheck of chronic medical conditions.  Advised to return to clinic sooner if any issues arise.

## 2018-09-03 NOTE — Patient Instructions (Signed)
Call if you need refills before next appt  Great to see you!

## 2018-09-04 ENCOUNTER — Encounter: Payer: Self-pay | Admitting: Family Medicine

## 2018-09-04 DIAGNOSIS — F32A Depression, unspecified: Secondary | ICD-10-CM | POA: Insufficient documentation

## 2018-09-04 DIAGNOSIS — F419 Anxiety disorder, unspecified: Principal | ICD-10-CM

## 2018-09-04 DIAGNOSIS — F909 Attention-deficit hyperactivity disorder, unspecified type: Secondary | ICD-10-CM | POA: Insufficient documentation

## 2018-09-04 DIAGNOSIS — F329 Major depressive disorder, single episode, unspecified: Secondary | ICD-10-CM | POA: Insufficient documentation

## 2018-09-04 LAB — THYROID PANEL WITH TSH
FREE THYROXINE INDEX: 2 (ref 1.4–3.8)
T3 Uptake: 31 % (ref 22–35)
T4, Total: 6.5 ug/dL (ref 5.1–11.9)
TSH: 0.25 mIU/L — ABNORMAL LOW

## 2018-09-27 ENCOUNTER — Other Ambulatory Visit: Payer: Self-pay | Admitting: Family Medicine

## 2018-09-27 DIAGNOSIS — F329 Major depressive disorder, single episode, unspecified: Secondary | ICD-10-CM

## 2018-09-27 DIAGNOSIS — F32A Depression, unspecified: Secondary | ICD-10-CM

## 2018-09-27 DIAGNOSIS — F909 Attention-deficit hyperactivity disorder, unspecified type: Secondary | ICD-10-CM

## 2018-09-27 DIAGNOSIS — F419 Anxiety disorder, unspecified: Principal | ICD-10-CM

## 2018-10-02 ENCOUNTER — Other Ambulatory Visit: Payer: Self-pay | Admitting: Family Medicine

## 2018-10-02 DIAGNOSIS — F329 Major depressive disorder, single episode, unspecified: Secondary | ICD-10-CM

## 2018-10-02 DIAGNOSIS — F419 Anxiety disorder, unspecified: Principal | ICD-10-CM

## 2018-10-02 DIAGNOSIS — F32A Depression, unspecified: Secondary | ICD-10-CM

## 2018-10-02 MED ORDER — AMPHETAMINE-DEXTROAMPHETAMINE 30 MG PO TABS: 30.0000 mg | ORAL_TABLET | Freq: Two times a day (BID) | ORAL | 0 refills | Status: DC

## 2018-10-02 MED ORDER — ALPRAZOLAM 1 MG PO TABS: 1.0000 mg | ORAL_TABLET | Freq: Two times a day (BID) | ORAL | 1 refills | Status: DC | PRN

## 2018-10-02 NOTE — Telephone Encounter (Signed)
Last refill:09/03/18

## 2018-10-02 NOTE — Telephone Encounter (Addendum)
Patient is checking on the status of her ALPRAZolam 1 mg Oral and   Amphetamine-Dextroamphetamine 1 tablet Oral 2 times daily medications.

## 2018-10-03 ENCOUNTER — Telehealth: Payer: Self-pay | Admitting: Family Medicine

## 2018-10-03 NOTE — Telephone Encounter (Signed)
Copied from [place] [id number]. Topic: General - Other >> Oct 03, 2018  4:45 PM Gustavus Messing wrote: Reason for CRM: [facility] pharmacist got a prescription for Mrs. Sams but [facility] was [facility] [place] Please call back [phone] Delynn Flavin

## 2018-10-04 ENCOUNTER — Telehealth: Payer: Self-pay | Admitting: Lab

## 2018-10-04 DIAGNOSIS — F419 Anxiety disorder, unspecified: Principal | ICD-10-CM

## 2018-10-04 DIAGNOSIS — F329 Major depressive disorder, single episode, unspecified: Secondary | ICD-10-CM

## 2018-10-04 DIAGNOSIS — F909 Attention-deficit hyperactivity disorder, unspecified type: Secondary | ICD-10-CM

## 2018-10-04 DIAGNOSIS — F32A Depression, unspecified: Secondary | ICD-10-CM

## 2018-10-04 MED ORDER — ALPRAZOLAM 1 MG PO TABS: 1.0000 mg | ORAL_TABLET | Freq: Two times a day (BID) | ORAL | 1 refills | Status: DC | PRN

## 2018-10-04 MED ORDER — AMPHETAMINE-DEXTROAMPHETAMINE 30 MG PO TABS: 30.0000 mg | ORAL_TABLET | Freq: Two times a day (BID) | ORAL | 0 refills | Status: DC

## 2018-10-04 NOTE — Telephone Encounter (Signed)
What prescription was it? Once I know what Rx it was I can re-send

## 2018-10-04 NOTE — Telephone Encounter (Signed)
Pharmacist called the [facility] Reason for CRM: [facility] pharmacist got a prescription for Mrs. Sams but [facility] was [facility] [place] Please call back [phone] Delynn Flavin

## 2018-10-04 NOTE — Telephone Encounter (Signed)
Rx Xanax and Adderall sent to correct pharmacy

## 2018-10-04 NOTE — Telephone Encounter (Signed)
[facility]

## 2018-10-04 NOTE — Telephone Encounter (Signed)
Rx resent to correct pharmacy

## 2018-10-04 NOTE — Telephone Encounter (Signed)
Called Pt No answer, left a VM that Rx was sent to the pharmacy, to call office if she had any questions.

## 2018-10-04 NOTE — Telephone Encounter (Signed)
I called and spoke to Delynn Flavin at the pharmacy, and he stated the Rx for Xanax and Adderall are the medications that the Pt want [facility] [place] on [address].

## 2018-10-28 ENCOUNTER — Other Ambulatory Visit: Payer: Self-pay | Admitting: Family Medicine

## 2018-10-28 DIAGNOSIS — F329 Major depressive disorder, single episode, unspecified: Secondary | ICD-10-CM

## 2018-10-28 DIAGNOSIS — F419 Anxiety disorder, unspecified: Secondary | ICD-10-CM

## 2018-10-28 DIAGNOSIS — F32A Depression, unspecified: Secondary | ICD-10-CM

## 2018-10-28 NOTE — Telephone Encounter (Signed)
Copied from [place] [phone] [phone]. Topic: Quick Communication - Rx Refill/Question >> Oct 28, 2018  1:52 PM Gustavus Messing wrote: Medication: ALPRAZolam Duanne Moron) 1 MG tablet  amphetamine-dextroamphetamine (ADDERALL) 30 MG tablet   Has the patient contacted their pharmacy? No. (Agent: If no, request that the patient contact the pharmacy for the refill.) The patient needs refills of both of these medications.    Preferred Pharmacy (with phone number or street name): [id number] [address], [place] [phone] (Phone) [phone] (Fax)    Agent: Please be advised that RX refills may take up to 3 business days. We ask that you follow-up with your pharmacy.

## 2018-10-29 ENCOUNTER — Other Ambulatory Visit: Payer: Self-pay | Admitting: Family Medicine

## 2018-10-29 ENCOUNTER — Telehealth: Payer: Self-pay | Admitting: Lab

## 2018-10-29 DIAGNOSIS — F909 Attention-deficit hyperactivity disorder, unspecified type: Secondary | ICD-10-CM

## 2018-10-29 MED ORDER — ALPRAZOLAM 1 MG PO TABS: 1.0000 mg | ORAL_TABLET | Freq: Two times a day (BID) | ORAL | 1 refills | Status: DC | PRN

## 2018-10-29 NOTE — Telephone Encounter (Signed)
PMP registry checked, appropriate

## 2018-10-29 NOTE — Telephone Encounter (Signed)
Last OV 3/19 last filled 10/04/18 for 30 and 1 refill ok to fill.

## 2018-10-29 NOTE — Telephone Encounter (Signed)
Message blank

## 2018-10-30 ENCOUNTER — Other Ambulatory Visit: Payer: Self-pay | Admitting: Family Medicine

## 2018-10-30 DIAGNOSIS — F909 Attention-deficit hyperactivity disorder, unspecified type: Secondary | ICD-10-CM

## 2018-10-30 MED ORDER — AMPHETAMINE-DEXTROAMPHETAMINE 30 MG PO TABS: 30.0000 mg | ORAL_TABLET | Freq: Two times a day (BID) | ORAL | 0 refills | Status: DC

## 2018-10-30 NOTE — Telephone Encounter (Signed)
Duplicate Rx approved

## 2018-11-21 ENCOUNTER — Encounter: Payer: Self-pay | Admitting: Family Medicine

## 2018-11-21 DIAGNOSIS — E039 Hypothyroidism, unspecified: Secondary | ICD-10-CM

## 2018-11-21 DIAGNOSIS — F909 Attention-deficit hyperactivity disorder, unspecified type: Secondary | ICD-10-CM

## 2018-11-22 ENCOUNTER — Other Ambulatory Visit: Payer: Self-pay | Admitting: Family Medicine

## 2018-11-22 DIAGNOSIS — F909 Attention-deficit hyperactivity disorder, unspecified type: Secondary | ICD-10-CM

## 2018-11-22 MED ORDER — ARMOUR THYROID 120 MG PO TABS: 120.0000 mg | ORAL_TABLET | Freq: Every day | ORAL | 1 refills | Status: DC

## 2018-11-22 MED ORDER — ARMOUR THYROID 30 MG PO TABS: 30.0000 mg | ORAL_TABLET | Freq: Every day | ORAL | 1 refills | Status: DC

## 2018-11-22 MED ORDER — AMPHETAMINE-DEXTROAMPHETAMINE 30 MG PO TABS: 30.0000 mg | ORAL_TABLET | Freq: Two times a day (BID) | ORAL | 0 refills | Status: DC

## 2018-12-05 ENCOUNTER — Ambulatory Visit (INDEPENDENT_AMBULATORY_CARE_PROVIDER_SITE_OTHER): Payer: Self-pay | Admitting: Family Medicine

## 2018-12-05 ENCOUNTER — Telehealth: Payer: Self-pay | Admitting: Family Medicine

## 2018-12-05 ENCOUNTER — Other Ambulatory Visit: Payer: Self-pay

## 2018-12-05 DIAGNOSIS — F419 Anxiety disorder, unspecified: Secondary | ICD-10-CM

## 2018-12-05 DIAGNOSIS — F32A Depression, unspecified: Secondary | ICD-10-CM

## 2018-12-05 DIAGNOSIS — F909 Attention-deficit hyperactivity disorder, unspecified type: Secondary | ICD-10-CM

## 2018-12-05 DIAGNOSIS — F329 Major depressive disorder, single episode, unspecified: Secondary | ICD-10-CM

## 2018-12-05 DIAGNOSIS — E039 Hypothyroidism, unspecified: Secondary | ICD-10-CM

## 2018-12-05 MED ORDER — THYROID 120 MG PO TABS: 120.0000 mg | ORAL_TABLET | Freq: Every day | ORAL | 1 refills | Status: DC

## 2018-12-05 NOTE — Telephone Encounter (Signed)
Called pt an scheduled 83m appt

## 2018-12-05 NOTE — Telephone Encounter (Signed)
Please call to set up 3 month follow up appt, will plan to be in office   Also -- is there a way to make a note in the chart she likes to go by "[name]"?  Thanks  LG

## 2018-12-05 NOTE — Progress Notes (Signed)
Patient ID: Rachel Alexander, female   DOB: Dec 11, 1980, 38 y.o.   MRN: [id number]    Virtual Visit via video Note  This visit type was conducted due to national recommendations for restrictions regarding the COVID-19 pandemic (e.g. social distancing).  This format is felt to be most appropriate for this patient at this time.  All issues noted in this document were discussed and addressed.  No physical exam was performed (except for noted visual exam findings with Video Visits).   I connected with Mercadies "[name]" Sams today at  8:00 AM EDT by a video enabled telemedicine application or telephone and verified that I am speaking with the correct person using two identifiers. Location patient: home Location provider: work or home office Persons participating in the virtual visit: patient, provider  I discussed the limitations, risks, security and privacy concerns of performing an evaluation and management service by video and the availability of in person appointments. I also discussed with the patient that there may be a patient responsible charge related to this service. The patient expressed understanding and agreed to proceed.  HPI:  Patient and I connected via video to follow-up on ADHD, anxiety and depression and thyroid.  Patient reports she has had increased stress anxiety throughout the pandemic due to being laid off.  However, she has been using her time to clean her home, get the pool set up for the summer and also looking for a new job.  States she is also excited about getting a new puppy, they are going to pick the dog up this weekend.  States her animals bring her joy.  Overall she does feel her mood is well controlled on current dose of Celexa, uses Xanax as needed usually 1-2 times a day a few days a week.  Denies any SI or HI.  Feels her ADHD is well controlled on current Adderall dose.  Even though she is not working, still is feeling accomplished each day with tasks she is getting done  around the home and being able to be there for her son.  Feels well on current thyroid dose. NP thyroid brand seems to work best for her; had taken levothyroxine before and did not notice any difference with that brand.   No fever or chills.  No chest pain.  No shortness breath or wheezing.  No nausea or vomiting.  No urinary problems.  No body aches.   ROS: See pertinent positives and negatives per HPI.  Past Medical History:  Diagnosis Date   Colitis    Depression    Frequent headaches    Hypothyroidism    Migraines    Pituitary tumor    Suicidal intent     Past Surgical History:  Procedure Laterality Date   TONSILLECTOMY     TUBAL LIGATION      Family History  Problem Relation Age of Onset   Diabetes Sister     Social History   Tobacco Use   Smoking status: Current Every Day Smoker    Packs/day: 1.00    Types: Cigarettes   Smokeless tobacco: Never Used  Substance Use Topics   Alcohol use: No    Current Outpatient Medications:    ALPRAZolam (XANAX) 1 MG tablet, Take 1 tablet (1 mg total) by mouth 2 (two) times daily as needed for anxiety., Disp: 60 tablet, Rfl: 1   amphetamine-dextroamphetamine (ADDERALL) 30 MG tablet, Take 1 tablet by mouth 2 (two) times daily., Disp: 60 tablet, Rfl: 0   ARMOUR THYROID  120 MG tablet, Take 1 tablet (120 mg total) by mouth daily., Disp: 90 tablet, Rfl: 1   ARMOUR THYROID 30 MG tablet, Take 1 tablet (30 mg total) by mouth daily before breakfast. Take with 120 mg tablet to make 150 mg dose, Disp: 90 tablet, Rfl: 1   citalopram (CELEXA) 40 MG tablet, Take 1 tablet (40 mg total) by mouth daily., Disp: 90 tablet, Rfl: 1  EXAM:  GENERAL: alert, oriented, appears well and in no acute distress  HEENT: atraumatic, conjunttiva clear, no obvious abnormalities on inspection of external nose and ears  NECK: normal movements of the head and neck  LUNGS: on inspection no signs of respiratory distress, breathing rate  appears normal, no obvious gross SOB, gasping or wheezing  CV: no obvious cyanosis  MS: moves all visible extremities without noticeable abnormality  PSYCH/NEURO: pleasant and cooperative, no obvious depression or anxiety, speech and thought processing grossly intact  ASSESSMENT AND PLAN:  Discussed the following assessment and plan:  Anxiety and depression-patient is stable on current dose of Celexa and uses Xanax as needed.  ADHD-Adderall dose currently working well to help concentration and keep her on task throughout the day.  She will continue this dose.  Hypothyroidism-feels well on dose of NP thyroid at a total of 150 mg/day.     I discussed the assessment and treatment plan with the patient. The patient was provided an opportunity to ask questions and all were answered. The patient agreed with the plan and demonstrated an understanding of the instructions.   The patient was advised to call back or seek an in-person evaluation if the symptoms worsen or if the condition fails to improve as anticipated.  We will otherwise have patient come to clinic in about 3 months for follow-up and we will plan to do blood work at that time.  Jodelle Green, FNP

## 2018-12-05 NOTE — Telephone Encounter (Signed)
Pt appt already set up, Pt stated someone called her.

## 2018-12-23 ENCOUNTER — Other Ambulatory Visit: Payer: Self-pay | Admitting: Family Medicine

## 2018-12-23 DIAGNOSIS — F419 Anxiety disorder, unspecified: Secondary | ICD-10-CM

## 2018-12-23 DIAGNOSIS — F32A Depression, unspecified: Secondary | ICD-10-CM

## 2018-12-23 DIAGNOSIS — F329 Major depressive disorder, single episode, unspecified: Secondary | ICD-10-CM

## 2018-12-23 DIAGNOSIS — F909 Attention-deficit hyperactivity disorder, unspecified type: Secondary | ICD-10-CM

## 2018-12-23 MED ORDER — ALPRAZOLAM 1 MG PO TABS: 1.0000 mg | ORAL_TABLET | Freq: Two times a day (BID) | ORAL | 1 refills | Status: DC | PRN

## 2018-12-23 MED ORDER — AMPHETAMINE-DEXTROAMPHETAMINE 30 MG PO TABS: 30.0000 mg | ORAL_TABLET | Freq: Two times a day (BID) | ORAL | 0 refills | Status: DC

## 2019-01-06 ENCOUNTER — Other Ambulatory Visit: Payer: Self-pay | Admitting: Family Medicine

## 2019-01-06 DIAGNOSIS — F909 Attention-deficit hyperactivity disorder, unspecified type: Secondary | ICD-10-CM

## 2019-01-07 ENCOUNTER — Other Ambulatory Visit: Payer: Self-pay | Admitting: Lab

## 2019-01-07 ENCOUNTER — Telehealth: Payer: Self-pay | Admitting: Lab

## 2019-01-07 DIAGNOSIS — F909 Attention-deficit hyperactivity disorder, unspecified type: Secondary | ICD-10-CM

## 2019-01-07 NOTE — Telephone Encounter (Signed)
Message is blank?

## 2019-01-17 ENCOUNTER — Other Ambulatory Visit: Payer: Self-pay | Admitting: Family Medicine

## 2019-01-17 DIAGNOSIS — F909 Attention-deficit hyperactivity disorder, unspecified type: Secondary | ICD-10-CM

## 2019-01-20 ENCOUNTER — Telehealth: Payer: Self-pay | Admitting: Lab

## 2019-01-20 ENCOUNTER — Other Ambulatory Visit: Payer: Self-pay | Admitting: Lab

## 2019-01-20 NOTE — Telephone Encounter (Signed)
Error It was for Adderall refill, but I looks like Pt has a refill pending

## 2019-01-20 NOTE — Telephone Encounter (Signed)
Message is blank?

## 2019-01-20 NOTE — Telephone Encounter (Signed)
Duplicate

## 2019-01-21 ENCOUNTER — Other Ambulatory Visit: Payer: Self-pay | Admitting: Family Medicine

## 2019-01-21 DIAGNOSIS — F909 Attention-deficit hyperactivity disorder, unspecified type: Secondary | ICD-10-CM

## 2019-01-24 ENCOUNTER — Other Ambulatory Visit: Payer: Self-pay | Admitting: Lab

## 2019-01-24 DIAGNOSIS — F909 Attention-deficit hyperactivity disorder, unspecified type: Secondary | ICD-10-CM

## 2019-01-24 MED ORDER — AMPHETAMINE-DEXTROAMPHETAMINE 30 MG PO TABS: 30.0000 mg | ORAL_TABLET | Freq: Two times a day (BID) | ORAL | 0 refills | Status: DC

## 2019-01-28 ENCOUNTER — Telehealth: Payer: Self-pay | Admitting: Family Medicine

## 2019-01-28 NOTE — Telephone Encounter (Signed)
Pt states this was called into her pharmacy and is ready for pick up ARMOUR THYROID 30 MG tablet  However the name brand is very expensive and pt wants to know if this can be called in as NP Thyroid.  Pt states that she already gets the 120mg  and not the 30mg .  Pt used to use the Good RX card for name brand but that is no longer covered on there.  [facility]  [id number] [address], [place] [phone] (Phone) [phone] (Fax)

## 2019-01-29 MED ORDER — THYROID 30 MG PO TABS
30.0000 mg | ORAL_TABLET | Freq: Every day | ORAL | 0 refills | Status: DC
Start: 2019-01-29 — End: 2019-02-19

## 2019-01-29 MED ORDER — THYROID 30 MG PO TABS: 30.0000 mg | ORAL_TABLET | Freq: Every day | ORAL | 0 refills | Status: DC

## 2019-01-29 NOTE — Telephone Encounter (Signed)
Patient aware.

## 2019-01-29 NOTE — Telephone Encounter (Signed)
Sent to pharmacy 

## 2019-02-17 ENCOUNTER — Telehealth: Payer: Self-pay | Admitting: Family Medicine

## 2019-02-17 ENCOUNTER — Other Ambulatory Visit: Payer: Self-pay | Admitting: Family Medicine

## 2019-02-17 DIAGNOSIS — F32A Depression, unspecified: Secondary | ICD-10-CM

## 2019-02-17 DIAGNOSIS — F909 Attention-deficit hyperactivity disorder, unspecified type: Secondary | ICD-10-CM

## 2019-02-17 DIAGNOSIS — F419 Anxiety disorder, unspecified: Secondary | ICD-10-CM

## 2019-02-17 DIAGNOSIS — F329 Major depressive disorder, single episode, unspecified: Secondary | ICD-10-CM

## 2019-02-18 ENCOUNTER — Other Ambulatory Visit: Payer: Self-pay | Admitting: Lab

## 2019-02-18 ENCOUNTER — Other Ambulatory Visit: Payer: Self-pay | Admitting: Family Medicine

## 2019-02-18 DIAGNOSIS — F32A Depression, unspecified: Secondary | ICD-10-CM

## 2019-02-18 DIAGNOSIS — F419 Anxiety disorder, unspecified: Secondary | ICD-10-CM

## 2019-02-18 DIAGNOSIS — F909 Attention-deficit hyperactivity disorder, unspecified type: Secondary | ICD-10-CM

## 2019-02-18 DIAGNOSIS — F329 Major depressive disorder, single episode, unspecified: Secondary | ICD-10-CM

## 2019-02-18 NOTE — Telephone Encounter (Signed)
Too early for adderall. Will send xanax seperately.

## 2019-02-18 NOTE — Telephone Encounter (Signed)
Pt called about Adderall refill/ advised of it being too early and Pt stated she will wait until it is due to be sent on 9.7.20

## 2019-02-18 NOTE — Telephone Encounter (Signed)
FYI

## 2019-02-21 MED ORDER — AMPHETAMINE-DEXTROAMPHETAMINE 30 MG PO TABS: 30.0000 mg | ORAL_TABLET | Freq: Two times a day (BID) | ORAL | 0 refills | Status: DC

## 2019-03-11 ENCOUNTER — Ambulatory Visit (INDEPENDENT_AMBULATORY_CARE_PROVIDER_SITE_OTHER): Payer: Self-pay | Admitting: Family Medicine

## 2019-03-11 ENCOUNTER — Other Ambulatory Visit: Payer: Self-pay

## 2019-03-11 DIAGNOSIS — E039 Hypothyroidism, unspecified: Secondary | ICD-10-CM

## 2019-03-11 DIAGNOSIS — F909 Attention-deficit hyperactivity disorder, unspecified type: Secondary | ICD-10-CM

## 2019-03-11 DIAGNOSIS — R5383 Other fatigue: Secondary | ICD-10-CM

## 2019-03-11 DIAGNOSIS — F419 Anxiety disorder, unspecified: Secondary | ICD-10-CM

## 2019-03-11 DIAGNOSIS — F329 Major depressive disorder, single episode, unspecified: Secondary | ICD-10-CM

## 2019-03-11 DIAGNOSIS — F32A Depression, unspecified: Secondary | ICD-10-CM

## 2019-03-11 NOTE — Progress Notes (Signed)
Patient ID: Rachel Alexander, female   DOB: [birth year], 38 y.o.   MRN: [id number]    Virtual Visit via video note  This visit type was conducted due to national recommendations for restrictions regarding the COVID-19 pandemic (e.g. social distancing).  This format is felt to be most appropriate for this patient at this time.  All issues noted in this document were discussed and addressed.  No physical exam was performed (except for noted visual exam findings with Video Visits).   I connected with Rachel Alexander today at  1:20 PM EDT by a video enabled telemedicine application or telephone and verified that I am speaking with the correct person using two identifiers. Location patient: home Location provider: work or home office Persons participating in the virtual visit: patient, provider  I discussed the limitations, risks, security and privacy concerns of performing an evaluation and management service by video and the availability of in person appointments. I also discussed with the patient that there may be a patient responsible charge related to this service. The patient expressed understanding and agreed to proceed.   HPI:  Patient and I connected via video to follow-up on ADHD, anxiety depression and hypothyroidism.  Patient was laid off due to the pandemic.  Currently she is still unemployed.  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  She has been able to collect unemployment, so she has not had a worry about money for her bills.  Also has good support from her boyfriend; they live together.  Does report feeling tired.  Is not sure this is related to her thyroid being off, feeling down due to not having a routine of getting up and going to work in the morning or combination of things.  Denies any SI or HI.  Is hopeful for the future.  Trying to look at the pandemic for a positive standpoint, hoping that she  can find a job that she really loves since she was laid off from previous job.   For the most part feels her mood is stable on current dose of Celexa.  Does use the Xanax usually 3 or 4 times a week.  Trying to use less, but has been having more stress throughout the pandemic.  Feels concentration is very good on Adderall.  Has been able to focus on applying for jobs, keeping the house clean and organized as well as help her son with his school work.   ROS: See pertinent positives and negatives per HPI.  Past Medical History:  Diagnosis Date  . Colitis   . Depression   . Frequent headaches   . Hypothyroidism   . Migraines   . Pituitary tumor   . Suicidal intent     Past Surgical History:  Procedure Laterality Date  . TONSILLECTOMY    . TUBAL LIGATION      Family History  Problem Relation Age of Onset  . Diabetes Sister    Social History   Tobacco Use  . Smoking status: Current Every Day Smoker    Packs/day: 1.00    Types: Cigarettes  . Smokeless tobacco: Never Used  Substance Use Topics  . Alcohol use: No    Current Outpatient Medications:  .  ALPRAZolam (XANAX) 1 MG tablet, TAKE 1 TABLET (1 MG TOTAL) BY MOUTH 2 (TWO) TIMES DAILY AS NEEDED FOR ANXIETY., Disp: 60 tablet, Rfl: 1 .  amphetamine-dextroamphetamine (ADDERALL) 30 MG tablet, Take 1  tablet by mouth 2 (two) times daily., Disp: 60 tablet, Rfl: 0 .  citalopram (CELEXA) 40 MG tablet, Take 1 tablet (40 mg total) by mouth daily., Disp: 90 tablet, Rfl: 1 .  NP THYROID 30 MG tablet, TAKE 1 TABLET BY MOUTH DAILY BEFORE BREAKFAST. TAKE WITH 120 MG TABLET TO MAKE 150 MG DOSE., Disp: 90 tablet, Rfl: 0 .  thyroid (NP THYROID) 120 MG tablet, Take 1 tablet (120 mg total) by mouth daily before breakfast., Disp: 90 tablet, Rfl: 1  EXAM:  GENERAL: alert, oriented, appears well and in no acute distress  HEENT: atraumatic, conjunttiva clear, no obvious abnormalities on inspection of external nose and ears  NECK: normal  movements of the head and neck  LUNGS: on inspection no signs of respiratory distress, breathing rate appears normal, no obvious gross SOB, gasping or wheezing  CV: no obvious cyanosis  MS: moves all visible extremities without noticeable abnormality  PSYCH/NEURO: pleasant and cooperative, no obvious depression or anxiety, speech and thought processing grossly intact  ASSESSMENT AND PLAN:  Discussed the following assessment and plan:  Hypothyroid -we will get new blood work to follow-up on thyroid panel levels and to see if any dose adjustment is needed.  Anxiety and depression-overall mood is stable.  Does have times of feeling down, attributes this to situation of pandemic.  Is trying to remain positive and is hopeful for the future.  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  Has good support from boyfriend.   Fatigue-lab work will help [place] further investigate.  Could be related combination of hypothyroidism & times of feeling down.    I discussed the assessment and treatment plan with the patient. The patient was provided an opportunity to ask questions and all were answered. The patient agreed with the plan and demonstrated an understanding of the instructions.   The patient was advised to call back or seek an in-person evaluation if the symptoms worsen or if the condition fails to improve as anticipated.     Jodelle Green, FNP

## 2019-03-19 ENCOUNTER — Other Ambulatory Visit: Payer: Self-pay | Admitting: Family Medicine

## 2019-03-19 DIAGNOSIS — F909 Attention-deficit hyperactivity disorder, unspecified type: Secondary | ICD-10-CM

## 2019-03-20 MED ORDER — AMPHETAMINE-DEXTROAMPHETAMINE 30 MG PO TABS: 30.0000 mg | ORAL_TABLET | Freq: Two times a day (BID) | ORAL | 0 refills | Status: DC

## 2019-04-16 ENCOUNTER — Other Ambulatory Visit: Payer: Self-pay

## 2019-04-17 ENCOUNTER — Ambulatory Visit (INDEPENDENT_AMBULATORY_CARE_PROVIDER_SITE_OTHER): Payer: Self-pay | Admitting: Family Medicine

## 2019-04-17 ENCOUNTER — Other Ambulatory Visit: Payer: Self-pay

## 2019-04-17 VITALS — BP 100/90 | HR 86 | Temp 97.1°F | Resp 16 | Ht 62.0 in | Wt 125.8 lb

## 2019-04-17 DIAGNOSIS — E039 Hypothyroidism, unspecified: Secondary | ICD-10-CM

## 2019-04-17 DIAGNOSIS — F32A Depression, unspecified: Secondary | ICD-10-CM

## 2019-04-17 DIAGNOSIS — Z716 Tobacco abuse counseling: Secondary | ICD-10-CM

## 2019-04-17 DIAGNOSIS — F909 Attention-deficit hyperactivity disorder, unspecified type: Secondary | ICD-10-CM

## 2019-04-17 DIAGNOSIS — F419 Anxiety disorder, unspecified: Secondary | ICD-10-CM

## 2019-04-17 DIAGNOSIS — F329 Major depressive disorder, single episode, unspecified: Secondary | ICD-10-CM

## 2019-04-17 DIAGNOSIS — N393 Stress incontinence (female) (male): Secondary | ICD-10-CM

## 2019-04-17 MED ORDER — CHANTIX STARTING MONTH PAK 0.5 MG X 11 & 1 MG X 42 PO TABS: ORAL_TABLET | ORAL | 0 refills | Status: DC

## 2019-04-17 MED ORDER — ALPRAZOLAM 1 MG PO TABS: 1.0000 mg | ORAL_TABLET | Freq: Two times a day (BID) | ORAL | 2 refills | Status: DC | PRN

## 2019-04-17 MED ORDER — OXYBUTYNIN CHLORIDE ER 10 MG PO TB24: 10.0000 mg | ORAL_TABLET | Freq: Every day | ORAL | 1 refills | Status: DC

## 2019-04-17 MED ORDER — CITALOPRAM HYDROBROMIDE 40 MG PO TABS: 40.0000 mg | ORAL_TABLET | Freq: Every day | ORAL | 1 refills | Status: DC

## 2019-04-17 MED ORDER — VARENICLINE TARTRATE 1 MG PO TABS: 1.0000 mg | ORAL_TABLET | Freq: Two times a day (BID) | ORAL | 2 refills | Status: DC

## 2019-04-17 MED ORDER — AMPHETAMINE-DEXTROAMPHETAMINE 30 MG PO TABS: 30.0000 mg | ORAL_TABLET | Freq: Two times a day (BID) | ORAL | 0 refills | Status: DC
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
## 2019-04-18 ENCOUNTER — Encounter: Payer: Self-pay | Admitting: Family Medicine

## 2019-04-18 ENCOUNTER — Other Ambulatory Visit: Payer: Self-pay | Admitting: Family Medicine

## 2019-04-18 DIAGNOSIS — Z716 Tobacco abuse counseling: Secondary | ICD-10-CM

## 2019-04-18 DIAGNOSIS — Z91199 Patient's noncompliance with other medical treatment and regimen due to unspecified reason: Secondary | ICD-10-CM | POA: Insufficient documentation

## 2019-04-18 DIAGNOSIS — F329 Major depressive disorder, single episode, unspecified: Secondary | ICD-10-CM

## 2019-04-18 DIAGNOSIS — F419 Anxiety disorder, unspecified: Secondary | ICD-10-CM

## 2019-04-18 DIAGNOSIS — N393 Stress incontinence (female) (male): Secondary | ICD-10-CM | POA: Insufficient documentation

## 2019-04-18 DIAGNOSIS — F32A Depression, unspecified: Secondary | ICD-10-CM

## 2019-04-18 DIAGNOSIS — F909 Attention-deficit hyperactivity disorder, unspecified type: Secondary | ICD-10-CM

## 2019-04-18 DIAGNOSIS — Z0000 Encounter for general adult medical examination without abnormal findings: Secondary | ICD-10-CM | POA: Insufficient documentation

## 2019-04-18 LAB — URINALYSIS, ROUTINE W REFLEX MICROSCOPIC
Bilirubin Urine: NEGATIVE
Ketones, ur: NEGATIVE
Leukocytes,Ua: NEGATIVE
Nitrite: NEGATIVE
Specific Gravity, Urine: 1.02 (ref 1.000–1.030)
Total Protein, Urine: NEGATIVE
Urine Glucose: NEGATIVE
Urobilinogen, UA: 0.2 (ref 0.0–1.0)
WBC, UA: NONE SEEN (ref 0–?)
pH: 6 (ref 5.0–8.0)

## 2019-04-18 LAB — THYROID PANEL WITH TSH
Free Thyroxine Index: 1 — ABNORMAL LOW (ref 1.4–3.8)
T3 Uptake: 22 % (ref 22–35)
T4, Total: 4.6 ug/dL — ABNORMAL LOW (ref 5.1–11.9)
TSH: 75.37 mIU/L — ABNORMAL HIGH

## 2019-04-18 NOTE — Telephone Encounter (Signed)
Copied from [place] [id number]. Topic: Quick Communication - Rx Refill/Question >> Apr 18, 2019 12:18 PM Izola Price, [place] A wrote: Medication: ALPRAZolam (XANAX) 1 MG tablet ,amphetamine-dextroamphetamine (ADDERALL) 30 MG tablet,citalopram (CELEXA) 40 MG tablet,oxybutynin (DITROPAN XL) 10 MG 24 hr tablet,varenicline (CHANTIX CONTINUING MONTH PAK) 1 MG tablet,varenicline (CHANTIX STARTING MONTH PAK) 0.5 MG X 11 & 1 MG X 42 tablet (Patient stated that the pharmacy lost power yesterday and they need all medications sent back over today.)  Has the patient contacted their pharmacy? {Yes (Agent: If no, request that the patient contact the pharmacy for the refill.) (Agent: If yes, when and what did the pharmacy advise?)Contact PCP  Preferred Pharmacy (with phone number or street name): [id number] [place], [place] [phone] (Phone) [phone] (Fax)    Agent: Please be advised that RX refills may take up to 3 business days. We ask that you follow-up with your pharmacy.

## 2019-04-18 NOTE — Telephone Encounter (Signed)
Requested medication (s) are due for refill today: yes  Requested medication (s) are on the active medication list: yes  Last refill:  04/17/2019  Future visit scheduled: no  Notes to clinic:  Pharmacy lost power and didn't receive scripts. Please resend   Requested Prescriptions  Pending Prescriptions Disp Refills   ALPRAZolam (XANAX) 1 MG tablet 60 tablet 2    Sig: Take 1 tablet (1 mg total) by mouth 2 (two) times daily as needed for anxiety.     Not Delegated - Psychiatry:  Anxiolytics/Hypnotics Failed - 04/18/2019 12:28 PM      Failed - This refill cannot be delegated      Failed - Urine Drug Screen completed in last 360 days.      Passed - Valid encounter within last 6 months    Recent Outpatient Visits          Yesterday Anxiety and depression   [place] [facility], Jacquelynn Cree, FNP   1 month ago Hypothyroidism, unspecified type   [place] [facility], Jacquelynn Cree, FNP   4 months ago Attention deficit hyperactivity disorder (ADHD), unspecified ADHD type   [place] Guse, Jacquelynn Cree, FNP   7 months ago Anxiety and depression   [facility] [place] [facility], Jacquelynn Cree, FNP              amphetamine-dextroamphetamine (ADDERALL) 30 MG tablet 60 tablet 0    Sig: Take 1 tablet by mouth 2 (two) times daily.     Not Delegated - Psychiatry:  Stimulants/ADHD Failed - 04/18/2019 12:28 PM      Failed - This refill cannot be delegated      Failed - Urine Drug Screen completed in last 360 days.      Passed - Valid encounter within last 3 months    Recent Outpatient Visits          Yesterday Anxiety and depression   [place] [facility], Jacquelynn Cree, FNP   1 month ago Hypothyroidism, unspecified type   [place] Guse, Jacquelynn Cree, FNP   4 months ago Attention deficit hyperactivity disorder (ADHD), unspecified ADHD type   [place] Guse, Jacquelynn Cree, FNP   7 months ago  Anxiety and depression    [facility], Jacquelynn Cree, FNP              citalopram (CELEXA) 40 MG tablet 90 tablet 1    Sig: Take 1 tablet (40 mg total) by mouth daily.     Psychiatry:  Antidepressants - SSRI Passed - 04/18/2019 12:28 PM      Passed - Valid encounter within last 6 months    Recent Outpatient Visits          Yesterday Anxiety and depression   [place] [facility], Jacquelynn Cree, FNP   1 month ago Hypothyroidism, unspecified type   [place] Guse, Jacquelynn Cree, FNP   4 months ago Attention deficit hyperactivity disorder (ADHD), unspecified ADHD type   [facility] [place] Guse, Jacquelynn Cree, FNP   7 months ago Anxiety and depression   [place], FNP             Passed - Completed PHQ-2 or PHQ-9 in the last 360 days.       varenicline (CHANTIX CONTINUING MONTH PAK) 1 MG tablet 60 tablet 2    Sig: Take 1 tablet (  1 mg total) by mouth 2 (two) times daily.     Psychiatry:  Drug Dependence Therapy Passed - 04/18/2019 12:28 PM      Passed - Valid encounter within last 12 months    Recent Outpatient Visits          Yesterday Anxiety and depression   [facility] [place] Guse, Jacquelynn Cree, FNP   1 month ago Hypothyroidism, unspecified type   [place] Guse, Jacquelynn Cree, FNP   4 months ago Attention deficit hyperactivity disorder (ADHD), unspecified ADHD type   [place] Guse, Jacquelynn Cree, FNP   7 months ago Anxiety and depression   [facility] [place] Guse, Jacquelynn Cree, FNP              varenicline (CHANTIX STARTING MONTH PAK) 0.5 MG X 11 & 1 MG X 42 tablet 53 tablet 0    Sig: Take one 0.5 mg tablet by mouth once daily for 3 days, then increase to one 0.5 mg tablet twice daily for 4 days, then increase to one 1 mg tablet twice daily.     Psychiatry:  Drug Dependence Therapy Passed - 04/18/2019 12:28 PM       Passed - Valid encounter within last 12 months    Recent Outpatient Visits          Yesterday Anxiety and depression    [facility] [place] Guse, Jacquelynn Cree, FNP   1 month ago Hypothyroidism, unspecified type   [place] Guse, Jacquelynn Cree, FNP   4 months ago Attention deficit hyperactivity disorder (ADHD), unspecified ADHD type   [place], FNP   7 months ago Anxiety and depression   [place] Guse, Jacquelynn Cree, FNP

## 2019-04-19 LAB — URINE CULTURE
MICRO NUMBER:: 1045047
SPECIMEN QUALITY:: ADEQUATE

## 2019-04-21 ENCOUNTER — Encounter: Payer: Self-pay | Admitting: Family Medicine

## 2019-04-21 DIAGNOSIS — F329 Major depressive disorder, single episode, unspecified: Secondary | ICD-10-CM

## 2019-04-21 DIAGNOSIS — E039 Hypothyroidism, unspecified: Secondary | ICD-10-CM

## 2019-04-21 DIAGNOSIS — N393 Stress incontinence (female) (male): Secondary | ICD-10-CM

## 2019-04-21 DIAGNOSIS — F419 Anxiety disorder, unspecified: Secondary | ICD-10-CM

## 2019-04-21 DIAGNOSIS — F909 Attention-deficit hyperactivity disorder, unspecified type: Secondary | ICD-10-CM

## 2019-04-21 DIAGNOSIS — F32A Depression, unspecified: Secondary | ICD-10-CM

## 2019-04-21 MED ORDER — ALPRAZOLAM 1 MG PO TABS: 1.0000 mg | ORAL_TABLET | Freq: Two times a day (BID) | ORAL | 2 refills | Status: DC | PRN

## 2019-04-21 MED ORDER — CHANTIX STARTING MONTH PAK 0.5 MG X 11 & 1 MG X 42 PO TABS: ORAL_TABLET | ORAL | 0 refills | Status: DC

## 2019-04-21 MED ORDER — CITALOPRAM HYDROBROMIDE 40 MG PO TABS: 40.0000 mg | ORAL_TABLET | Freq: Every day | ORAL | 1 refills | Status: DC

## 2019-04-21 MED ORDER — VARENICLINE TARTRATE 1 MG PO TABS: 1.0000 mg | ORAL_TABLET | Freq: Two times a day (BID) | ORAL | 2 refills | Status: DC

## 2019-04-21 MED ORDER — AMPHETAMINE-DEXTROAMPHETAMINE 30 MG PO TABS: 30.0000 mg | ORAL_TABLET | Freq: Two times a day (BID) | ORAL | 0 refills | Status: DC

## 2019-04-21 MED ORDER — THYROID 120 MG PO TABS: 120.0000 mg | ORAL_TABLET | Freq: Every day | ORAL | 1 refills | Status: DC

## 2019-04-21 MED ORDER — THYROID 60 MG PO TABS: 60.0000 mg | ORAL_TABLET | Freq: Every day | ORAL | 1 refills | Status: DC

## 2019-04-21 MED ORDER — OXYBUTYNIN CHLORIDE ER 10 MG PO TB24: 10.0000 mg | ORAL_TABLET | Freq: Every day | ORAL | 1 refills | Status: DC

## 2019-04-21 NOTE — Telephone Encounter (Signed)
[facility]

## 2019-04-22 NOTE — Addendum Note (Signed)
Addended by: Philis Nettle on: 04/22/2019 03:45 PM   Modules accepted: Orders

## 2019-05-05 ENCOUNTER — Encounter: Payer: Self-pay | Admitting: Obstetrics and Gynecology

## 2019-05-27 ENCOUNTER — Telehealth: Payer: Self-pay | Admitting: Family Medicine

## 2019-05-27 DIAGNOSIS — F909 Attention-deficit hyperactivity disorder, unspecified type: Secondary | ICD-10-CM

## 2019-05-27 MED ORDER — AMPHETAMINE-DEXTROAMPHETAMINE 30 MG PO TABS: 30.0000 mg | ORAL_TABLET | Freq: Two times a day (BID) | ORAL | 0 refills | Status: DC

## 2019-05-27 NOTE — Telephone Encounter (Signed)
Medication Refill - Medication: amphetamine-dextroamphetamine (ADDERALL) 30 MG tablet [id number]      Preferred Pharmacy (with phone number or street name): [id number] [place], [place]  [address] [place] [address]  Phone: [phone] Fax: [phone]      Agent: Please be advised that RX refills may take up to 3 business days. We ask that you follow-up with your pharmacy.

## 2019-05-27 NOTE — Telephone Encounter (Signed)
Sent to pharmacy. She needs to schedule an appointment for some time in January or February to follow-up on her ADHD.

## 2019-05-28 NOTE — Telephone Encounter (Signed)
OK for PEC to advise.  I called and lm on patient.s voicemail informing her that her medication was sent to pharmacy but she needed to schedule a follow up in January or February.  Brown Dunlap,cma

## 2019-06-20 ENCOUNTER — Other Ambulatory Visit: Payer: Self-pay | Admitting: Family Medicine

## 2019-06-20 DIAGNOSIS — F909 Attention-deficit hyperactivity disorder, unspecified type: Secondary | ICD-10-CM

## 2019-06-24 ENCOUNTER — Other Ambulatory Visit: Payer: Self-pay | Admitting: Family Medicine

## 2019-06-24 ENCOUNTER — Encounter: Payer: Self-pay | Admitting: Obstetrics & Gynecology

## 2019-06-24 DIAGNOSIS — F909 Attention-deficit hyperactivity disorder, unspecified type: Secondary | ICD-10-CM

## 2019-06-25 ENCOUNTER — Other Ambulatory Visit: Payer: Self-pay | Admitting: Family Medicine

## 2019-06-25 ENCOUNTER — Ambulatory Visit: Payer: Self-pay | Admitting: Internal Medicine

## 2019-06-25 DIAGNOSIS — F909 Attention-deficit hyperactivity disorder, unspecified type: Secondary | ICD-10-CM

## 2019-06-25 MED ORDER — AMPHETAMINE-DEXTROAMPHETAMINE 30 MG PO TABS: 30.0000 mg | ORAL_TABLET | Freq: Two times a day (BID) | ORAL | 0 refills | Status: DC

## 2019-06-25 NOTE — Telephone Encounter (Signed)
Patient has scheduled tranfer of care with [facility] for 07/28/19 the earliest available ok to fill til she can transfer?

## 2019-07-11 ENCOUNTER — Other Ambulatory Visit: Payer: Self-pay | Admitting: Family Medicine

## 2019-07-11 DIAGNOSIS — F329 Major depressive disorder, single episode, unspecified: Secondary | ICD-10-CM

## 2019-07-11 DIAGNOSIS — F419 Anxiety disorder, unspecified: Secondary | ICD-10-CM

## 2019-07-11 DIAGNOSIS — F32A Depression, unspecified: Secondary | ICD-10-CM

## 2019-07-11 NOTE — Telephone Encounter (Signed)
Pt needs refill on alprazolam. Pt has appt in February. [facility]

## 2019-07-14 MED ORDER — ALPRAZOLAM 1 MG PO TABS: 1.0000 mg | ORAL_TABLET | Freq: Two times a day (BID) | ORAL | 1 refills | Status: DC | PRN

## 2019-07-14 NOTE — Telephone Encounter (Signed)
Upcoming appointment I looked up patient on [place] Controlled Substances Reporting System and saw no activity that raised concern of inappropriate use.

## 2019-07-28 ENCOUNTER — Other Ambulatory Visit: Payer: Self-pay

## 2019-07-28 ENCOUNTER — Encounter: Payer: Self-pay | Admitting: Family

## 2019-07-28 ENCOUNTER — Telehealth: Payer: Self-pay | Admitting: Family

## 2019-07-28 ENCOUNTER — Ambulatory Visit (INDEPENDENT_AMBULATORY_CARE_PROVIDER_SITE_OTHER): Payer: Self-pay | Admitting: Family

## 2019-07-28 ENCOUNTER — Telehealth: Payer: Self-pay | Admitting: Family Medicine

## 2019-07-28 VITALS — Ht 62.0 in | Wt 125.8 lb

## 2019-07-28 DIAGNOSIS — E039 Hypothyroidism, unspecified: Secondary | ICD-10-CM

## 2019-07-28 DIAGNOSIS — F329 Major depressive disorder, single episode, unspecified: Secondary | ICD-10-CM

## 2019-07-28 DIAGNOSIS — R519 Headache, unspecified: Secondary | ICD-10-CM

## 2019-07-28 DIAGNOSIS — F32A Depression, unspecified: Secondary | ICD-10-CM

## 2019-07-28 DIAGNOSIS — F909 Attention-deficit hyperactivity disorder, unspecified type: Secondary | ICD-10-CM

## 2019-07-28 DIAGNOSIS — F419 Anxiety disorder, unspecified: Secondary | ICD-10-CM

## 2019-07-28 DIAGNOSIS — Z716 Tobacco abuse counseling: Secondary | ICD-10-CM

## 2019-07-28 DIAGNOSIS — N393 Stress incontinence (female) (male): Secondary | ICD-10-CM

## 2019-07-28 DIAGNOSIS — R5383 Other fatigue: Secondary | ICD-10-CM

## 2019-07-28 DIAGNOSIS — G8929 Other chronic pain: Secondary | ICD-10-CM

## 2019-07-28 MED ORDER — CITALOPRAM HYDROBROMIDE 20 MG PO TABS: 20.0000 mg | ORAL_TABLET | Freq: Every day | ORAL | 0 refills | Status: DC

## 2019-07-28 MED ORDER — VENLAFAXINE HCL ER 37.5 MG PO CP24: 37.5000 mg | ORAL_CAPSULE | Freq: Every day | ORAL | 0 refills | Status: DC

## 2019-07-28 NOTE — Assessment & Plan Note (Signed)
Not bothersome at this time patient.  She will let me know how she is doing

## 2019-07-28 NOTE — Patient Instructions (Addendum)
Please also ensure you are taking NP thyroid correctly as this can affect thyroid levels-  Take thyroid medication once a day, every day at the same time before breakfast. Take  only water and on an empty stomach. Wait 30 minutes to 1 hour before eating or drinking anything other than water.Please also separated by >4 hours from acid reflux medications, calcium, iron, multivitamins as may interfere with absorption.   You may decrease Celexa to 20 mg.  I have sent in this new dose for you.   If you would prefer to break 40 mg tablet, if it is scored, you may do so as well.  Please take Celexa 20 mg for the next 3 days, and then take every other day for 4 days  You may start Effexor once you are on the 20mg  of celexa.   Close follow up in ONE Month.

## 2019-07-28 NOTE — Telephone Encounter (Signed)
Pt needs Xanax and Adderrall to be filled. Pt is wanting to know if this could be called in for her.

## 2019-07-28 NOTE — Assessment & Plan Note (Signed)
Patient has been taking NP Thyroid with Celexa.  I have spent time in regards to education on how to take her thyroid medication and how not to take with any other medications.  Pending TSH today.  Do suspect that her abnormally high TSH is likely affecting her depression, fatigue.

## 2019-07-28 NOTE — Telephone Encounter (Signed)
Pt needs a refill on ALPRAZolam (XANAX) 1 MG tablet and amphetamine-dextroamphetamine (ADDERALL) 30 MG tablet.

## 2019-07-28 NOTE — Assessment & Plan Note (Signed)
Congratulated her on smoking cessation however advised of the risks of lung disease with vaping.  Advised her to reconsider chantix if she can.

## 2019-07-28 NOTE — Assessment & Plan Note (Addendum)
At baseline however we agreed that celexa didn't appear as helpful as had been in the past. Trial of effexor.Wean off and cross taper with celexa. She has no suicidal or homicidal ideation.  She does not appear to be a danger to herself at this time.  Supportive second husband, and no longer an unhealthy marriage.  declines counseling. Close follow up.

## 2019-07-28 NOTE — Assessment & Plan Note (Signed)
Etiology unclear at this time.  Suspect multifactorial including nonrestorative sleep, depression; she is also not euthyroid.  Discussed referral to neurology for evaluation of sleep apnea

## 2019-07-28 NOTE — Assessment & Plan Note (Signed)
Tension and migrainous qualities. Concern for nighttime awakenings, h/o pituitary tumor ( no records of this in chart, nor imaging). Pending MRI brain and trial of effexor. I have also placed referral to neurology. Will follow.

## 2019-07-28 NOTE — Assessment & Plan Note (Signed)
Doing well on current regimen.  Patient does not feel anxiety is worse on Adderall.  She feels that she needs her Adderall to perform at work.  At this time, we agreed that we will continue Adderall

## 2019-07-28 NOTE — Progress Notes (Signed)
Virtual Visit via Video Note  I connected with@  on 07/28/19 at  3:00 PM EST by a video enabled telemedicine application and verified that I am speaking with the correct person using two identifiers.  Location patient: home Location provider:work  Persons participating in the virtual visit: patient, provider  I discussed the limitations of evaluation and management by telemedicine and the availability of in person appointments. The patient expressed understanding and agreed to proceed.   HPI: Establish care Complains of chronic fatigue for 20+ years. Asleep by 7/8am. Feels tired during the daytime. Some spells where trouble falling asleep. Gets 6-8 hours per night. Doesn't feel restored by sleep.  Snores on occasion.   She also complains of h/o migraine for 20+ years. HA may last for days, it will ease off . Has had episodes where HA woke her up due to pain. Has seen neurology in the past.  Has been taking imitrex with resolve. Notes of a lot of stress however life is better now . H/o pituitary  Tumor.  HA will present BL eyes or posterior neck. Endorses photophobia, nausea. Thinks HA may be triggered by 'sinuses'. Uses nasal strips at night. Drinks 2 twenty ounce Diet dr pepper/ day, one cup coffee/ day. Takes ibuprofen only prn. No vision loss, flashing lights. Cold washcloth helps with HA. HA is not positional nor worse with BM, intercourse. No h/o cancer.   Hypothyroidism- compliant with NP thyroid 180 mg.has been taking with celexa.  Anxiety and depression-improved over all. takes xanax once to BID per day which contributes to fatigue; xanax helps with feeling with nervous. feels like celexa had worked better in the past. No si/hi. Has had thoughts in the past about not wanting to live. H/o suicide attempt in 2010. Took sinus medication and cut arm; at that time, was in a bad marriage. He made her feel worthless. Has gotten out of that marriage. Has loving family support and couldn't imagine  a husband as kind as her husband is now.    Has done counseling in the past.   Smoking cessation-never started Chantix due to insurance; stopped smoking, now vaping.   Stress incontinence- improved. has stopped taking oxybutynin as felt like urination was decreased. Since back to normal. Incontinence was only when jumped, as improved.   ADHD- compliant with adderall, helps with concentration. Using typicallly once per day, however sometimes uses twice per day. Doesn't think that contributory to anxiety.   ROS: See pertinent positives and negatives per HPI.  Past Medical History:  Diagnosis Date  . Colitis   . Depression   . Frequent headaches   . Hypothyroidism   . Migraines   . Pituitary tumor   . Suicidal intent     Past Surgical History:  Procedure Laterality Date  . TONSILLECTOMY    . TUBAL LIGATION      Family History  Problem Relation Age of Onset  . Diabetes Sister     SOCIAL HX: former smoker   Current Outpatient Medications:  .  ALPRAZolam (XANAX) 1 MG tablet, Take 1 tablet (1 mg total) by mouth 2 (two) times daily as needed for anxiety., Disp: 60 tablet, Rfl: 2 .  ALPRAZolam (XANAX) 1 MG tablet, Take 1 tablet (1 mg total) by mouth 2 (two) times daily as needed for anxiety., Disp: 30 tablet, Rfl: 1 .  amphetamine-dextroamphetamine (ADDERALL) 30 MG tablet, Take 1 tablet by mouth 2 (two) times daily., Disp: 60 tablet, Rfl: 0 .  thyroid (NP THYROID) 120  MG tablet, Take 1 tablet (120 mg total) by mouth daily before breakfast. Take with 60 mg tablet to make total 180 mg per day., Disp: 90 tablet, Rfl: 1 .  thyroid (NP THYROID) 60 MG tablet, Take 1 tablet (60 mg total) by mouth daily before breakfast., Disp: 90 tablet, Rfl: 1 .  citalopram (CELEXA) 20 MG tablet, Take 1 tablet (20 mg total) by mouth daily., Disp: 30 tablet, Rfl: 0 .  venlafaxine XR (EFFEXOR XR) 37.5 MG 24 hr capsule, Take 1 capsule (37.5 mg total) by mouth daily with breakfast., Disp: 90 capsule, Rfl:  0  EXAM:  VITALS per patient if applicable:  GENERAL: alert, oriented, appears well and in no acute distress  HEENT: atraumatic, conjunttiva clear, no obvious abnormalities on inspection of external nose and ears  NECK: normal movements of the head and neck  LUNGS: on inspection no signs of respiratory distress, breathing rate appears normal, no obvious gross SOB, gasping or wheezing  CV: no obvious cyanosis  MS: moves all visible extremities without noticeable abnormality  PSYCH/NEURO: pleasant and cooperative, no obvious depression or anxiety, speech and thought processing grossly intact  ASSESSMENT AND PLAN:  Discussed the following assessment and plan:  Fatigue, unspecified type - Plan: MR Brain W Wo Contrast, CBC with Differential/Platelet, TSH, Hemoglobin A1c, T3, free, T4, free, Ambulatory referral to Neurology, Comprehensive metabolic panel, VITAMIN D 25 Hydroxy (Vit-D Deficiency, Fractures)  Anxiety and depression - Plan: venlafaxine XR (EFFEXOR XR) 37.5 MG 24 hr capsule, citalopram (CELEXA) 20 MG tablet  Attention deficit hyperactivity disorder (ADHD), unspecified ADHD type  Hypothyroidism, unspecified type - Plan: TSH, T3, free, T4, free  Chronic nonintractable headache, unspecified headache type  Stress incontinence  Encounter for smoking cessation counseling Problem List Items Addressed This Visit      Endocrine   Hypothyroidism    Patient has been taking NP Thyroid with Celexa.  I have spent time in regards to education on how to take her thyroid medication and how not to take with any other medications.  Pending TSH today.  Do suspect that her abnormally high TSH is likely affecting her depression, fatigue.      Relevant Orders   TSH   T3, free   T4, free     Other   Anxiety and depression    At baseline however we agreed that celexa didn't appear as helpful as had been in the past. Trial of effexor.Wean off and cross taper with celexa. She has no  suicidal or homicidal ideation.  She does not appear to be a danger to herself at this time.  Supportive second husband, and no longer an unhealthy marriage.  declines counseling. Close follow up.      Relevant Medications   venlafaxine XR (EFFEXOR XR) 37.5 MG 24 hr capsule   citalopram (CELEXA) 20 MG tablet   Attention deficit hyperactivity disorder (ADHD)    Doing well on current regimen.  Patient does not feel anxiety is worse on Adderall.  She feels that she needs her Adderall to perform at work.  At this time, we agreed that we will continue Adderall      Encounter for smoking cessation counseling    Congratulated her on smoking cessation however advised of the risks of lung disease with vaping.  Advised her to reconsider chantix if she can.       Fatigue - Primary    Etiology unclear at this time.  Suspect multifactorial including nonrestorative sleep, depression; she is also not  euthyroid.  Discussed referral to neurology for evaluation of sleep apnea      Relevant Orders   MR Brain W Wo Contrast   CBC with Differential/Platelet   TSH   Hemoglobin A1c   T3, free   T4, free   Ambulatory referral to Neurology   Comprehensive metabolic panel   VITAMIN D 25 Hydroxy (Vit-D Deficiency, Fractures)   Headache    Tension and migrainous qualities. Concern for nighttime awakenings, h/o pituitary tumor ( no records of this in chart, nor imaging). Pending MRI brain and trial of effexor. I have also placed referral to neurology. Will follow.       Relevant Medications   venlafaxine XR (EFFEXOR XR) 37.5 MG 24 hr capsule   citalopram (CELEXA) 20 MG tablet   Stress incontinence    Not bothersome at this time patient.  She will let me know how she is doing         -we discussed possible serious and likely etiologies, options for evaluation and workup, limitations of telemedicine visit vs in person visit, treatment, treatment risks and precautions. Pt prefers to treat via telemedicine  empirically rather then risking or undertaking an in person visit at this moment. Patient agrees to seek prompt in person care if worsening, new symptoms arise, or if is not improving with treatment.   I discussed the assessment and treatment plan with the patient. The patient was provided an opportunity to ask questions and all were answered. The patient agreed with the plan and demonstrated an understanding of the instructions.   The patient was advised to call back or seek an in-person evaluation if the symptoms worsen or if the condition fails to improve as anticipated.   Rachel Paris, FNP

## 2019-07-29 ENCOUNTER — Encounter: Payer: Self-pay | Admitting: Family

## 2019-07-29 ENCOUNTER — Telehealth: Payer: Self-pay | Admitting: Family

## 2019-07-29 ENCOUNTER — Other Ambulatory Visit: Payer: Self-pay | Admitting: Family Medicine

## 2019-07-29 DIAGNOSIS — F329 Major depressive disorder, single episode, unspecified: Secondary | ICD-10-CM

## 2019-07-29 DIAGNOSIS — E039 Hypothyroidism, unspecified: Secondary | ICD-10-CM

## 2019-07-29 DIAGNOSIS — F909 Attention-deficit hyperactivity disorder, unspecified type: Secondary | ICD-10-CM

## 2019-07-29 DIAGNOSIS — R899 Unspecified abnormal finding in specimens from other organs, systems and tissues: Secondary | ICD-10-CM

## 2019-07-29 DIAGNOSIS — F32A Depression, unspecified: Secondary | ICD-10-CM

## 2019-07-29 LAB — COMPREHENSIVE METABOLIC PANEL
ALT: 14 U/L (ref 0–35)
AST: 17 U/L (ref 0–37)
Albumin: 4.3 g/dL (ref 3.5–5.2)
Alkaline Phosphatase: 43 U/L (ref 39–117)
BUN: 8 mg/dL (ref 6–23)
CO2: 30 mEq/L (ref 19–32)
Calcium: 8.7 mg/dL (ref 8.4–10.5)
Chloride: 101 mEq/L (ref 96–112)
Creatinine, Ser: 0.71 mg/dL (ref 0.40–1.20)
GFR: 91.21 mL/min (ref >60.00)
Glucose, Bld: 81 mg/dL (ref 70–99)
Potassium: 3.7 mEq/L (ref 3.5–5.1)
Sodium: 138 mEq/L (ref 135–145)
Total Bilirubin: 0.2 mg/dL (ref 0.2–1.2)
Total Protein: 6.3 g/dL (ref 6.0–8.3)

## 2019-07-29 LAB — CBC WITH DIFFERENTIAL/PLATELET
Basophils Absolute: 0.1 10*3/uL (ref 0.0–0.1)
Basophils Relative: 1.3 % (ref 0.0–3.0)
Eosinophils Absolute: 0 10*3/uL (ref 0.0–0.7)
Eosinophils Relative: 0.1 % (ref 0.0–5.0)
HCT: 36.6 % (ref 36.0–46.0)
Hemoglobin: 12.1 g/dL (ref 12.0–15.0)
Lymphocytes Relative: 47.8 % — ABNORMAL HIGH (ref 12.0–46.0)
Lymphs Abs: 2.2 10*3/uL (ref 0.7–4.0)
MCHC: 33.2 g/dL (ref 30.0–36.0)
MCV: 101 fl — ABNORMAL HIGH (ref 78.0–100.0)
Monocytes Absolute: 0.3 10*3/uL (ref 0.1–1.0)
Monocytes Relative: 7.4 % (ref 3.0–12.0)
Neutro Abs: 2 10*3/uL (ref 1.4–7.7)
Neutrophils Relative %: 43.4 % (ref 43.0–77.0)
Platelets: 219 10*3/uL (ref 150.0–400.0)
RBC: 3.62 Mil/uL — ABNORMAL LOW (ref 3.87–5.11)
RDW: 14 % (ref 11.5–15.5)
WBC: 4.7 10*3/uL (ref 4.0–10.5)

## 2019-07-29 LAB — T4, FREE: Free T4: 0.34 ng/dL — ABNORMAL LOW (ref 0.60–1.60)

## 2019-07-29 LAB — HEMOGLOBIN A1C: Hgb A1c MFr Bld: 5.6 % (ref 4.6–6.5)

## 2019-07-29 LAB — TSH: TSH: 94.79 u[IU]/mL — ABNORMAL HIGH (ref 0.35–4.50)

## 2019-07-29 LAB — VITAMIN D 25 HYDROXY (VIT D DEFICIENCY, FRACTURES): VITD: 16.76 ng/mL — ABNORMAL LOW (ref 30.00–100.00)

## 2019-07-29 LAB — T3, FREE: T3, Free: 2.7 pg/mL (ref 2.3–4.2)

## 2019-07-29 MED ORDER — AMPHETAMINE-DEXTROAMPHETAMINE 30 MG PO TABS: 30.0000 mg | ORAL_TABLET | Freq: Two times a day (BID) | ORAL | 0 refills | Status: DC

## 2019-07-29 MED ORDER — LEVOTHYROXINE SODIUM 88 MCG PO TABS: 88.0000 ug | ORAL_TABLET | Freq: Every day | ORAL | 1 refills | Status: DC

## 2019-07-29 MED ORDER — ALPRAZOLAM 1 MG PO TABS: 1.0000 mg | ORAL_TABLET | Freq: Two times a day (BID) | ORAL | 1 refills | Status: DC | PRN

## 2019-07-29 NOTE — Telephone Encounter (Signed)
Alprazolam   Refilled: 07/14/2019 Adderall   Refilled: 06/25/2019  Last OV: 07/28/2019 Next OV: 08/27/2019

## 2019-07-29 NOTE — Telephone Encounter (Signed)
Duplicate message. 

## 2019-07-29 NOTE — Telephone Encounter (Signed)
Call pt I have refilled xanax and adderall  I also sent a result note - high priority- in regards to her labs. Please review with her as well.    I looked up patient on [place] Controlled Substances Reporting System and saw no activity that raised concern of inappropriate use.

## 2019-07-30 NOTE — Telephone Encounter (Signed)
Would you call pt and ensure she rec'ed mychart message and UNDERSTOOD all  Please sch her tsh and cbc in 4 weeks

## 2019-07-31 NOTE — Telephone Encounter (Signed)
LM asking patient to please call back asap.

## 2019-07-31 NOTE — Telephone Encounter (Signed)
I spoke with patient & she did see lab results online. She has picked up 88 MCG of levothyroxine to start. She has f/u 3/10 & will just have her labs drawn then. She said that she just wants to "feel normal again". She said she is just so tired & her brain feels foggy especially when adderall wears off.

## 2019-08-01 NOTE — Telephone Encounter (Signed)
noted 

## 2019-08-03 ENCOUNTER — Ambulatory Visit
Admission: RE | Admit: 2019-08-03 | Discharge: 2019-08-03 | Disposition: A | Discharge status: Home / Self Care | Payer: Self-pay | Source: Ambulatory Visit | Attending: Family | Admitting: Family

## 2019-08-03 ENCOUNTER — Other Ambulatory Visit: Payer: Self-pay

## 2019-08-03 DIAGNOSIS — R5383 Other fatigue: Secondary | ICD-10-CM | POA: Insufficient documentation

## 2019-08-03 MED ORDER — GADOBUTROL 1 MMOL/ML IV SOLN
6.0000 mL | Freq: Once | INTRAVENOUS | Status: AC | PRN
  Administered 2019-08-03: 6 mL via INTRAVENOUS

## 2019-08-15 ENCOUNTER — Emergency Department: Payer: Self-pay

## 2019-08-15 ENCOUNTER — Emergency Department
Admission: EM | Admit: 2019-08-15 | Discharge: 2019-08-15 | Disposition: A | Discharge status: Home / Self Care | Payer: Self-pay | Attending: Student | Admitting: Student

## 2019-08-15 ENCOUNTER — Other Ambulatory Visit: Payer: Self-pay

## 2019-08-15 ENCOUNTER — Telehealth: Payer: Self-pay | Admitting: Family

## 2019-08-15 DIAGNOSIS — E039 Hypothyroidism, unspecified: Secondary | ICD-10-CM | POA: Insufficient documentation

## 2019-08-15 DIAGNOSIS — Z87891 Personal history of nicotine dependence: Secondary | ICD-10-CM | POA: Insufficient documentation

## 2019-08-15 DIAGNOSIS — Z79899 Other long term (current) drug therapy: Secondary | ICD-10-CM | POA: Insufficient documentation

## 2019-08-15 DIAGNOSIS — K5901 Slow transit constipation: Secondary | ICD-10-CM | POA: Insufficient documentation

## 2019-08-15 LAB — URINALYSIS, COMPLETE (UACMP) WITH MICROSCOPIC
Bilirubin Urine: NEGATIVE
Glucose, UA: NEGATIVE mg/dL
Ketones, ur: NEGATIVE mg/dL
Leukocytes,Ua: NEGATIVE
Nitrite: NEGATIVE
Protein, ur: NEGATIVE mg/dL
Specific Gravity, Urine: 1.006 (ref 1.005–1.030)
pH: 7 (ref 5.0–8.0)

## 2019-08-15 LAB — COMPREHENSIVE METABOLIC PANEL
ALT: 16 U/L (ref 0–44)
AST: 22 U/L (ref 15–41)
Albumin: 4.4 g/dL (ref 3.5–5.0)
Alkaline Phosphatase: 40 U/L (ref 38–126)
Anion gap: 8 (ref 5–15)
BUN: 6 mg/dL (ref 6–20)
CO2: 28 mmol/L (ref 22–32)
Calcium: 8.6 mg/dL — ABNORMAL LOW (ref 8.9–10.3)
Chloride: 103 mmol/L (ref 98–111)
Creatinine, Ser: 0.69 mg/dL (ref 0.44–1.00)
GFR calc Af Amer: >60 mL/min (ref >60)
GFR calc non Af Amer: >60 mL/min (ref >60)
Glucose, Bld: 100 mg/dL — ABNORMAL HIGH (ref 70–99)
Potassium: 3.8 mmol/L (ref 3.5–5.1)
Sodium: 139 mmol/L (ref 135–145)
Total Bilirubin: 0.4 mg/dL (ref 0.3–1.2)
Total Protein: 7.2 g/dL (ref 6.5–8.1)

## 2019-08-15 LAB — CBC
HCT: 37.8 % (ref 36.0–46.0)
Hemoglobin: 12.3 g/dL (ref 12.0–15.0)
MCH: 33.3 pg (ref 26.0–34.0)
MCHC: 32.5 g/dL (ref 30.0–36.0)
MCV: 102.4 fL — ABNORMAL HIGH (ref 80.0–100.0)
Platelets: 227 10*3/uL (ref 150–400)
RBC: 3.69 MIL/uL — ABNORMAL LOW (ref 3.87–5.11)
RDW: 13.1 % (ref 11.5–15.5)
WBC: 5.6 10*3/uL (ref 4.0–10.5)
nRBC: 0 % (ref 0.0–0.2)

## 2019-08-15 LAB — PREGNANCY, URINE: Preg Test, Ur: NEGATIVE

## 2019-08-15 LAB — LIPASE, BLOOD: Lipase: 19 U/L (ref 11–51)

## 2019-08-15 MED ORDER — MAGNESIUM CITRATE PO SOLN
1.0000 | Freq: Once | ORAL | Status: AC
  Administered 2019-08-15: 1 via ORAL
  Filled 2019-08-15: qty 296

## 2019-08-15 MED ORDER — SODIUM CHLORIDE 0.9% FLUSH: 3.0000 mL | Freq: Once | INTRAVENOUS | Status: DC

## 2019-08-15 NOTE — Discharge Instructions (Addendum)
Follow-up with your regular doctor as needed. Return the emergency department if worsening. Use the mag citrate. If unsuccessful with the mag citrate please try a fleets enema with Colace. Continue to take MiraLAX daily. Try to eat a high-fiber diet.

## 2019-08-15 NOTE — ED Triage Notes (Signed)
Pt comes via POV from home with c/o fever. Pt states she has been constipation for awhile now and recently it has gotten worse over the last few months.  Pt states she took medication with no relief. Pt states she had a loose BM this am that was just water. Pt states fever and some belly pain.

## 2019-08-15 NOTE — Telephone Encounter (Signed)
In ed 

## 2019-08-15 NOTE — Telephone Encounter (Signed)
Pt called in and said she is running a fever, she has blood in her stool, and herstomach hurts really bad. She said she is having a hard time going. She also mentioned her thyroid levels are high. Sent patient to [facility] @ [facility].

## 2019-08-15 NOTE — Telephone Encounter (Signed)
Patient currently  In ED confirmed.

## 2019-08-15 NOTE — ED Provider Notes (Signed)
[facility] Emergency Department Provider Note  ____________________________________________   First MD Initiated Contact with Patient 08/15/19 1522     (approximate)  I have reviewed the triage vital signs and the nursing notes.   HISTORY  Chief Complaint Fever and Abdominal Pain    HPI Rachel Alexander is a 39 y.o. female presents emergency department complaining of abdominal pain and constipation.  Patient states that her thyroid levels became very high as and her TSH was 94.  Her doctor is already addressing these levels.  Also she has not had a bowel movement 1 week.  She took some Dulcolax liquid last night and has had some diarrhea but feels that she may still have a blockage.  She states she felt like she had a fever this morning.  Has not had one since.  Has a history of colitis.  She denies vomiting.  Denies cough or congestion.    Past Medical History:  Diagnosis Date  . Colitis   . Depression   . Frequent headaches   . Hypothyroidism   . Migraines   . Pituitary tumor   . Suicidal intent     Patient Active Problem List   Diagnosis Date Noted  . Fatigue 07/28/2019  . Headache 07/28/2019  . Stress incontinence 04/18/2019  . Encounter for smoking cessation counseling 04/18/2019  . Anxiety and depression 09/04/2018  . Attention deficit hyperactivity disorder (ADHD) 09/04/2018  . Colitis 05/04/2014  . Hypothyroidism 05/04/2014    Past Surgical History:  Procedure Laterality Date  . TONSILLECTOMY    . TUBAL LIGATION      Prior to Admission medications   Medication Sig Start Date End Date Taking? Authorizing Provider  ALPRAZolam Duanne Moron) 1 MG tablet Take 1 tablet (1 mg total) by mouth 2 (two) times daily as needed for anxiety. 07/29/19   Burnard Hawthorne, FNP  amphetamine-dextroamphetamine (ADDERALL) 30 MG tablet Take 1 tablet by mouth 2 (two) times daily. 07/29/19   Burnard Hawthorne, FNP  citalopram (CELEXA) 20 MG tablet Take 1 tablet  (20 mg total) by mouth daily. 07/28/19   Burnard Hawthorne, FNP  levothyroxine (SYNTHROID) 88 MCG tablet Take 1 tablet (88 mcg total) by mouth daily before breakfast. 07/29/19   Burnard Hawthorne, FNP  venlafaxine XR (EFFEXOR XR) 37.5 MG 24 hr capsule Take 1 capsule (37.5 mg total) by mouth daily with breakfast. 07/28/19   Burnard Hawthorne, FNP  dicyclomine (BENTYL) 20 MG tablet Take 1 tablet (20 mg total) by mouth 2 (two) times daily. 08/06/14 09/25/14  Antonietta Breach, PA-C    Allergies Patient has no known allergies.  Family History  Problem Relation Age of Onset  . Diabetes Sister     Social History Social History   Tobacco Use  . Smoking status: Former Smoker    Packs/day: 1.00    Types: Cigarettes  . Smokeless tobacco: Never Used  Substance Use Topics  . Alcohol use: No  . Drug use: No    Review of Systems  Constitutional: No fever/chills Eyes: No visual changes. ENT: No sore throat. Respiratory: Denies cough Cardiovascular: Denies chest pain Gastrointestinal: Positive for constipation and abdominal pain Genitourinary: Negative for dysuria. Musculoskeletal: Negative for back pain. Skin: Negative for rash. Psychiatric: no mood changes,     ____________________________________________   PHYSICAL EXAM:  VITAL SIGNS: ED Triage Vitals [08/15/19 1414]  Enc Vitals Group     BP 117/73     Pulse Rate 84     Resp  18     Temp 98.6 F (37 C)     Temp src      SpO2 100 %     Weight 125 lb 12.8 oz (57.1 kg)     Height 5\' 2"  (1.575 m)     Head Circumference      Peak Flow      Pain Score 6     Pain Loc      Pain Edu?      Excl. in [place]?     Constitutional: Alert and oriented. Well appearing and in no acute distress. Eyes: Conjunctivae are normal.  Head: Atraumatic. Nose: No congestion/rhinnorhea. Mouth/Throat: Mucous membranes are moist.   Neck:  supple no lymphadenopathy noted Cardiovascular: Normal rate, regular rhythm. Heart sounds are normal Respiratory:  Normal respiratory effort.  No retractions, lungs c t a  Abd: soft tender in the left lower quadrant, bs normal all 4 quad GU: deferred Musculoskeletal: FROM all extremities, warm and well perfused Neurologic:  Normal speech and language.  Skin:  Skin is warm, dry and intact. No rash noted. Psychiatric: Mood and affect are normal. Speech and behavior are normal.  ____________________________________________   LABS (all labs ordered are listed, but only abnormal results are displayed)  Labs Reviewed  COMPREHENSIVE METABOLIC PANEL - Abnormal; Notable for the following components:      Result Value   Glucose, Bld 100 (*)    Calcium 8.6 (*)    All other components within normal limits  CBC - Abnormal; Notable for the following components:   RBC 3.69 (*)    MCV 102.4 (*)    All other components within normal limits  URINALYSIS, COMPLETE (UACMP) WITH MICROSCOPIC - Abnormal; Notable for the following components:   Color, Urine YELLOW (*)    APPearance CLEAR (*)    Hgb urine dipstick MODERATE (*)    Bacteria, UA RARE (*)    All other components within normal limits  LIPASE, BLOOD  PREGNANCY, URINE   ____________________________________________   ____________________________________________  RADIOLOGY  DG abdomen shows a large amount of stool but no bowel obstruction  ____________________________________________   PROCEDURES  Procedure(s) performed: No  Procedures    ____________________________________________   INITIAL IMPRESSION / ASSESSMENT AND PLAN / ED COURSE  Pertinent labs & imaging results that were available during my care of the patient were reviewed by me and considered in my medical decision making (see chart for details).   Patient is a 39 year old female presents emergency department with abdominal pain, constipation, questionable fever this morning.  History of colitis.  Physical exam shows patient to appear well.  Abdomen is tender left lower  quadrant.  Bowel sounds are present.  DDx: Colitis, small bowel obstruction, volvulus, constipation  CBC is normal, lipase normal, comprehensive metabolic panel is normal, urinalysis is normal, urine pregnancy negative  X-ray of the abdomen shows a large amount of stool but no obstruction.  Explained the findings to the patient.  She will be given mag citrate prior to discharge.  She is to return if she is unable to pass a moderate amount of stool with the mag citrate and MiraLAX.  If she begins to have increased abdominal pain or fever she should return emergency department.  She states she understands will comply.  She is discharged stable condition.   Keianna Hartsock was evaluated in Emergency Department on 08/15/2019 for the symptoms described in the history of present illness. She was evaluated in the context of the global COVID-19 pandemic, which  necessitated consideration that the patient might be at risk for infection with the SARS-CoV-2 virus that causes COVID-19. Institutional protocols and algorithms that pertain to the evaluation of patients at risk for COVID-19 are in a state of rapid change based on information released by regulatory bodies including the CDC and federal and state organizations. These policies and algorithms were followed during the patient's care in the ED.   As part of my medical decision making, I reviewed the following data within the [place] notes reviewed and incorporated, Labs reviewed see above, Old chart reviewed, Radiograph reviewed , Notes from prior ED visits and [place] Controlled Substance Database  ____________________________________________   FINAL CLINICAL IMPRESSION(S) / ED DIAGNOSES  Final diagnoses:  Slow transit constipation      NEW MEDICATIONS STARTED DURING THIS VISIT:  Discharge Medication List as of 08/15/2019  5:45 PM       Note:  This document was prepared using Dragon voice recognition software and may  include unintentional dictation errors.    Versie Starks, PA-C 08/15/19 1839    Lilia Pro., MD 08/16/19 1236

## 2019-08-15 NOTE — Telephone Encounter (Signed)
[facility] RECORD [facility] Patient Name: Rachel Alexander Gender: Female DOB: Sep 23, 1980 Age: 39 Y 11 M 19 D Return Phone Number: [phone] (Primary), [phone] (Secondary) Address: City/State/Zip: [place] [address] Client [facility] Client Site [place] - Day Physician Rachel Alexander- NP Contact Type Call Who Is Calling Patient / Member / Family / Caregiver Call Type Triage / Clinical Relationship To Patient Self Return Phone Number [phone] (Primary) Chief Complaint ABDOMINAL PAIN - Severe and only in abdomen Reason for Call Symptomatic / Request for [name] states having bad abd pain, blood in stool, she used Ducolax last night, she has issues with constipation. The diarrhea is really black, feeling feverish. Her stomach is very swollen. She has been put in hospital before with Colitis Translation No Nurse Assessment Nurse: May, RN, Tammy Date/Time Rachel Alexander Time): 08/15/2019 12:39:51 PM Confirm and document reason for call. If symptomatic, describe symptoms. ---Caller states she started having severe abdominal pain. Caller states she took dulcolax liquid for constipation. Caller states her abdomen swelled up and this morning she has had loose, dark and red streaked stool. Has the patient had close contact with a person known or suspected to have the novel coronavirus illness OR traveled / lives in area with major community spread (including international travel) in the last 14 days from the onset of symptoms? * If Asymptomatic, screen for exposure and travel within the last 14 days. ---No Does the patient have any new or worsening symptoms? ---Yes Will a triage be completed? ---Yes Related visit to physician within the last 2 weeks? ---No Does the PT have any chronic conditions? (i.e.  diabetes, asthma, this includes High risk factors for pregnancy, etc.) ---Yes List chronic conditions. ---thyroid Is the patient pregnant or possibly pregnant? (Ask all females between the ages of 51-55) ---No Is this a behavioral health or substance abuse call? ---No PLEASE NOTE: All timestamps contained within this report are represented as [place] Standard Time. CONFIDENTIALTY NOTICE: This fax transmission is intended only for the addressee. It contains information that is legally privileged, confidential or otherwise protected from use or disclosure. If you are not the intended recipient, you are strictly prohibited from reviewing, disclosing, copying using or disseminating any of this information or taking any action in reliance on or regarding this information. If you have received this fax in error, please notify us immediately by telephone so that we can arrange for its return to [place]. Phone: [phone], Toll-Free: [phone], Fax: [phone] Page: 2 of 2 Call Id: [id number] Guidelines Guideline Title Affirmed Question Affirmed Notes Nurse Date/Time Rachel Alexander Time) Abdominal Pain - Female Black or tarry bowel movements (Exception: chronic-unchanged blackgrey bowel movements AND is taking iron pills or Pepto-bismol) May, RN, Tammy 08/15/2019 12:42:43 PM Disp. Time Rachel Alexander Time) Disposition Final User 08/15/2019 12:38:06 PM Send to Urgent Queue Jearld Shines 08/15/2019 12:46:00 PM Go to ED Now Yes May, RN, Tammy Caller Disagree/Comply Comply Caller Understands Yes PreDisposition Did not know what to do Care Advice Given Per Guideline GO TO ED NOW: * You need to be seen in the Emergency Department. * Go to the ED at ___________ [place] now. Drive carefully. DRIVING: Another adult should drive. Do not delay going to the Emergency Department. If immediate transportation is not available via car or taxi, then the patient should be instructed to call [id number]. BRING MEDICINES:  CARE ADVICE given per Abdominal Pain,  Female (Adult) guideline.

## 2019-08-19 ENCOUNTER — Other Ambulatory Visit: Payer: Self-pay

## 2019-08-19 ENCOUNTER — Emergency Department
Admission: EM | Admit: 2019-08-19 | Discharge: 2019-08-20 | Disposition: A | Discharge status: Home / Self Care | Payer: Self-pay | Attending: Emergency Medicine | Admitting: Emergency Medicine

## 2019-08-19 ENCOUNTER — Emergency Department: Payer: Self-pay

## 2019-08-19 ENCOUNTER — Encounter: Payer: Self-pay | Admitting: *Deleted

## 2019-08-19 DIAGNOSIS — Z87891 Personal history of nicotine dependence: Secondary | ICD-10-CM | POA: Insufficient documentation

## 2019-08-19 DIAGNOSIS — Z79899 Other long term (current) drug therapy: Secondary | ICD-10-CM | POA: Insufficient documentation

## 2019-08-19 DIAGNOSIS — E039 Hypothyroidism, unspecified: Secondary | ICD-10-CM | POA: Insufficient documentation

## 2019-08-19 DIAGNOSIS — K5900 Constipation, unspecified: Secondary | ICD-10-CM | POA: Insufficient documentation

## 2019-08-19 LAB — COMPREHENSIVE METABOLIC PANEL
ALT: 18 U/L (ref 0–44)
AST: 22 U/L (ref 15–41)
Albumin: 4.7 g/dL (ref 3.5–5.0)
Alkaline Phosphatase: 43 U/L (ref 38–126)
Anion gap: 12 (ref 5–15)
BUN: 11 mg/dL (ref 6–20)
CO2: 29 mmol/L (ref 22–32)
Calcium: 9.2 mg/dL (ref 8.9–10.3)
Chloride: 96 mmol/L — ABNORMAL LOW (ref 98–111)
Creatinine, Ser: 0.7 mg/dL (ref 0.44–1.00)
GFR calc Af Amer: >60 mL/min (ref >60)
GFR calc non Af Amer: >60 mL/min (ref >60)
Glucose, Bld: 94 mg/dL (ref 70–99)
Potassium: 3.6 mmol/L (ref 3.5–5.1)
Sodium: 137 mmol/L (ref 135–145)
Total Bilirubin: 0.3 mg/dL (ref 0.3–1.2)
Total Protein: 7.5 g/dL (ref 6.5–8.1)

## 2019-08-19 LAB — CBC
HCT: 38.2 % (ref 36.0–46.0)
Hemoglobin: 12.5 g/dL (ref 12.0–15.0)
MCH: 33.7 pg (ref 26.0–34.0)
MCHC: 32.7 g/dL (ref 30.0–36.0)
MCV: 103 fL — ABNORMAL HIGH (ref 80.0–100.0)
Platelets: 246 10*3/uL (ref 150–400)
RBC: 3.71 MIL/uL — ABNORMAL LOW (ref 3.87–5.11)
RDW: 12.9 % (ref 11.5–15.5)
WBC: 5.9 10*3/uL (ref 4.0–10.5)
nRBC: 0 % (ref 0.0–0.2)

## 2019-08-19 LAB — POCT PREGNANCY, URINE: Preg Test, Ur: NEGATIVE

## 2019-08-19 LAB — LIPASE, BLOOD: Lipase: 22 U/L (ref 11–51)

## 2019-08-19 NOTE — ED Triage Notes (Signed)
Pt has lower abd pain.  Pt reports chronic constipation.  Pt was seen in er last week for similar sx.  Last bm was 3 days ago.  Pt alert  Speech clear.

## 2019-08-19 NOTE — ED Provider Notes (Signed)
[facility] Emergency Department Provider Note  ____________________________________________   First MD Initiated Contact with Patient 08/19/19 2317     (approximate)  I have reviewed the triage vital signs and the nursing notes.   HISTORY  Chief Complaint Constipation    HPI Rachel Alexander is a 39 y.o. female with below list of previous medical conditions presents to the emergency department secondary to constipation with last BM 3 days ago.  Patient was seen in the emergency department secondary to constipation on 08/15/2019 for which the patient was given magnesium citrate and recommendation to take MiraLAX.  Patient states after doing so she had watery stools for approximately 4 hours however no formed stools.  Patient states that she also took a Fleet enema without any improvement.  As stated before in the previous encounter patient has known hypothyroidism with recent medication change secondary to elevating TSH level.       Past Medical History:  Diagnosis Date  . Colitis   . Depression   . Frequent headaches   . Hypothyroidism   . Migraines   . Pituitary tumor   . Suicidal intent     Patient Active Problem List   Diagnosis Date Noted  . Fatigue 07/28/2019  . Headache 07/28/2019  . Stress incontinence 04/18/2019  . Encounter for smoking cessation counseling 04/18/2019  . Anxiety and depression 09/04/2018  . Attention deficit hyperactivity disorder (ADHD) 09/04/2018  . Colitis 05/04/2014  . Hypothyroidism 05/04/2014    Past Surgical History:  Procedure Laterality Date  . TONSILLECTOMY    . TUBAL LIGATION      Prior to Admission medications   Medication Sig Start Date End Date Taking? Authorizing Provider  ALPRAZolam Duanne Moron) 1 MG tablet Take 1 tablet (1 mg total) by mouth 2 (two) times daily as needed for anxiety. 07/29/19   Burnard Hawthorne, FNP  amphetamine-dextroamphetamine (ADDERALL) 30 MG tablet Take 1 tablet by mouth 2 (two)  times daily. 07/29/19   Burnard Hawthorne, FNP  citalopram (CELEXA) 20 MG tablet Take 1 tablet (20 mg total) by mouth daily. 07/28/19   Burnard Hawthorne, FNP  levothyroxine (SYNTHROID) 88 MCG tablet Take 1 tablet (88 mcg total) by mouth daily before breakfast. 07/29/19   Burnard Hawthorne, FNP  venlafaxine XR (EFFEXOR XR) 37.5 MG 24 hr capsule Take 1 capsule (37.5 mg total) by mouth daily with breakfast. 07/28/19   Burnard Hawthorne, FNP  dicyclomine (BENTYL) 20 MG tablet Take 1 tablet (20 mg total) by mouth 2 (two) times daily. 08/06/14 09/25/14  Antonietta Breach, PA-C    Allergies Patient has no known allergies.  Family History  Problem Relation Age of Onset  . Diabetes Sister     Social History Social History   Tobacco Use  . Smoking status: Former Smoker    Packs/day: 1.00    Types: Cigarettes  . Smokeless tobacco: Never Used  Substance Use Topics  . Alcohol use: No  . Drug use: No    Review of Systems Constitutional: No fever/chills Eyes: No visual changes. ENT: No sore throat. Cardiovascular: Denies chest pain. Respiratory: Denies shortness of breath. Gastrointestinal: No abdominal pain.  No nausea, no vomiting.  No diarrhea.  Positive for constipation. Genitourinary: Negative for dysuria. Musculoskeletal: Negative for neck pain.  Negative for back pain. Integumentary: Negative for rash. Neurological: Negative for headaches, focal weakness or numbness.   ____________________________________________   PHYSICAL EXAM:  VITAL SIGNS: ED Triage Vitals  Enc Vitals Group  BP 08/19/19 2125 100/72     Pulse Rate 08/19/19 2125 100     Resp 08/19/19 2125 18     Temp 08/19/19 2125 98.7 F (37.1 C)     Temp Source 08/19/19 2125 Oral     SpO2 08/19/19 2125 99 %     Weight 08/19/19 2122 56.7 kg (125 lb)     Height 08/19/19 2122 1.575 m (5\' 2" )     Head Circumference --      Peak Flow --      Pain Score --      Pain Loc --      Pain Edu? --      Excl. in [place]? --      Constitutional: Alert and oriented.  Eyes: Conjunctivae are normal.  Mouth/Throat: Patient is wearing a mask. Neck: No stridor.  No meningeal signs.   Cardiovascular: Normal rate, regular rhythm. Good peripheral circulation. Grossly normal heart sounds. Respiratory: Normal respiratory effort.  No retractions. Gastrointestinal: Left lower quadrant/right lower quadrant tenderness to palpation.. No distention.  Musculoskeletal: No lower extremity tenderness nor edema. No gross deformities of extremities. Neurologic:  Normal speech and language. No gross focal neurologic deficits are appreciated.  Skin:  Skin is warm, dry and intact. Psychiatric: Mood and affect are normal. Speech and behavior are normal.  ____________________________________________   LABS (all labs ordered are listed, but only abnormal results are displayed)  Labs Reviewed  COMPREHENSIVE METABOLIC PANEL - Abnormal; Notable for the following components:      Result Value   Chloride 96 (*)    All other components within normal limits  CBC - Abnormal; Notable for the following components:   RBC 3.71 (*)    MCV 103.0 (*)    All other components within normal limits  LIPASE, BLOOD  POCT PREGNANCY, URINE   ________________________________________  RADIOLOGY I, Masury N Gaberiel Youngblood, personally viewed and evaluated these images (plain radiographs) as part of my medical decision making, as well as reviewing the written report by the radiologist.  ED MD interpretation: X-ray revealed nonobstructive bowel gas pattern  CT revealed large amount of colonic stool.  Official radiology report(s): DG Abdomen 1 View  Result Date: 08/20/2019 CLINICAL DATA:  Abdominal pain and constipation EXAM: ABDOMEN - 1 VIEW COMPARISON:  August 15, 2019 FINDINGS: The bowel gas pattern is normal. Mildly prominent air-filled loop of probable colon seen within the mid abdomen. No radio-opaque calculi or other significant radiographic  abnormality are seen. IMPRESSION: Nonobstructive bowel gas pattern. Electronically Signed   By: Prudencio Pair M.D.   On: 08/20/2019 00:03   CT ABDOMEN PELVIS W CONTRAST  Result Date: 08/20/2019 CLINICAL DATA:  Abdominal pain EXAM: CT ABDOMEN AND PELVIS WITH CONTRAST TECHNIQUE: Multidetector CT imaging of the abdomen and pelvis was performed using the standard protocol following bolus administration of intravenous contrast. CONTRAST:  191mL OMNIPAQUE IOHEXOL 300 MG/ML  SOLN COMPARISON:  None. FINDINGS: Lower chest: The visualized heart size within normal limits. No pericardial fluid/thickening. No hiatal hernia. The visualized portions of the lungs are clear. Hepatobiliary: The liver is normal in density without focal abnormality.The main portal vein is patent. No evidence of calcified gallstones, gallbladder wall thickening or biliary dilatation. Pancreas: Unremarkable. No pancreatic ductal dilatation or surrounding inflammatory changes. Spleen: Normal in size without focal abnormality. Adrenals/Urinary Tract: Both adrenal glands appear normal. The kidneys and collecting system appear normal without evidence of urinary tract calculus or hydronephrosis. Bladder is unremarkable. Stomach/Bowel: The stomach and small bowel are normal in  appearance. There is a large amount of colonic stool seen throughout. Mildly prominent stool-filled loops of transverse colon are seen measuring up to 5.9 cm. There is air and stool seen in a mildly dilated rectum. No focal fat stranding changes or bowel wall thickening are seen. The appendix is unremarkable. Vascular/Lymphatic: There are no enlarged mesenteric, retroperitoneal, or pelvic lymph nodes. No significant vascular findings are present. Reproductive: The uterus and adnexa are unremarkable. Other: No evidence of abdominal wall mass or hernia. Musculoskeletal: No acute or significant osseous findings. IMPRESSION: Large amount of colonic stool seen throughout with mildly  prominent loops of transverse colon and rectum which could be due to constipation and ileus. Electronically Signed   By: Prudencio Pair M.D.   On: 08/20/2019 02:03     Procedures   ____________________________________________   INITIAL IMPRESSION / MDM / ASSESSMENT AND PLAN / ED COURSE  As part of my medical decision making, I reviewed the following data within the electronic MEDICAL RECORD NUMBER   39 year old female presented with above-stated history and physical exam consistent with constipation less likely obstruction.  Plain x-ray revealed a nonobstructive bowel gas pattern however patient admits to abdominal discomfort CT scan revealed a large colonic stool burden.  Patient given a enema with results.  Patient also given lactulose by mouth.  ____________________________________________  FINAL CLINICAL IMPRESSION(S) / ED DIAGNOSES  Final diagnoses:  Constipation, unspecified constipation type     MEDICATIONS GIVEN DURING THIS VISIT:  Medications  magnesium citrate solution 1 Bottle (1 Bottle Oral Given 08/20/19 0206)  docusate sodium (COLACE) capsule 100 mg (100 mg Oral Given 08/20/19 0207)  mineral oil enema 1 enema (1 enema Rectal Given 08/20/19 0217)  lactulose (CHRONULAC) 10 GM/15ML solution 20 g (20 g Oral Given 08/20/19 0207)  iohexol (OMNIPAQUE) 300 MG/ML solution 100 mL (100 mLs Intravenous Contrast Given 08/20/19 0143)  lactulose (CHRONULAC) 10 GM/15ML solution 20 g (20 g Oral Given 08/20/19 [id number])     ED Discharge Orders    None      *Please note:  Dezzie Agramonte was evaluated in Emergency Department on 08/20/2019 for the symptoms described in the history of present illness. She was evaluated in the context of the global COVID-19 pandemic, which necessitated consideration that the patient might be at risk for infection with the SARS-CoV-2 virus that causes COVID-19. Institutional protocols and algorithms that pertain to the evaluation of patients at risk for COVID-19 are in a state  of rapid change based on information released by regulatory bodies including the CDC and federal and state organizations. These policies and algorithms were followed during the patient's care in the ED.  Some ED evaluations and interventions may be delayed as a result of limited staffing during the pandemic.*  Note:  This document was prepared using Dragon voice recognition software and may include unintentional dictation errors.   Gregor Hams, MD 08/20/19 [phone]

## 2019-08-20 ENCOUNTER — Other Ambulatory Visit: Payer: Self-pay

## 2019-08-20 ENCOUNTER — Other Ambulatory Visit: Payer: Self-pay | Admitting: Family

## 2019-08-20 ENCOUNTER — Emergency Department (HOSPITAL_COMMUNITY)
Admission: EM | Admit: 2019-08-20 | Discharge: 2019-08-21 | Disposition: A | Discharge status: Home / Self Care | Payer: Self-pay | Attending: Emergency Medicine | Admitting: Emergency Medicine

## 2019-08-20 ENCOUNTER — Emergency Department: Payer: Self-pay

## 2019-08-20 ENCOUNTER — Encounter (HOSPITAL_COMMUNITY): Payer: Self-pay

## 2019-08-20 ENCOUNTER — Telehealth: Payer: Self-pay | Admitting: Family

## 2019-08-20 DIAGNOSIS — R112 Nausea with vomiting, unspecified: Secondary | ICD-10-CM | POA: Insufficient documentation

## 2019-08-20 DIAGNOSIS — K5900 Constipation, unspecified: Secondary | ICD-10-CM | POA: Insufficient documentation

## 2019-08-20 DIAGNOSIS — F329 Major depressive disorder, single episode, unspecified: Secondary | ICD-10-CM

## 2019-08-20 DIAGNOSIS — Z5321 Procedure and treatment not carried out due to patient leaving prior to being seen by health care provider: Secondary | ICD-10-CM | POA: Insufficient documentation

## 2019-08-20 DIAGNOSIS — K5909 Other constipation: Secondary | ICD-10-CM

## 2019-08-20 DIAGNOSIS — F32A Depression, unspecified: Secondary | ICD-10-CM

## 2019-08-20 DIAGNOSIS — F419 Anxiety disorder, unspecified: Secondary | ICD-10-CM

## 2019-08-20 LAB — URINALYSIS, ROUTINE W REFLEX MICROSCOPIC
Bilirubin Urine: NEGATIVE
Glucose, UA: NEGATIVE mg/dL
Hgb urine dipstick: NEGATIVE
Ketones, ur: NEGATIVE mg/dL
Leukocytes,Ua: NEGATIVE
Nitrite: NEGATIVE
Protein, ur: NEGATIVE mg/dL
Specific Gravity, Urine: 1.009 (ref 1.005–1.030)
pH: 5 (ref 5.0–8.0)

## 2019-08-20 LAB — CBC
HCT: 38.1 % (ref 36.0–46.0)
Hemoglobin: 12.6 g/dL (ref 12.0–15.0)
MCH: 33.6 pg (ref 26.0–34.0)
MCHC: 33.1 g/dL (ref 30.0–36.0)
MCV: 101.6 fL — ABNORMAL HIGH (ref 80.0–100.0)
Platelets: 258 10*3/uL (ref 150–400)
RBC: 3.75 MIL/uL — ABNORMAL LOW (ref 3.87–5.11)
RDW: 12.9 % (ref 11.5–15.5)
WBC: 6.2 10*3/uL (ref 4.0–10.5)
nRBC: 0 % (ref 0.0–0.2)

## 2019-08-20 LAB — I-STAT BETA HCG BLOOD, ED (MC, WL, AP ONLY): I-stat hCG, quantitative: <5 m[IU]/mL (ref <5)

## 2019-08-20 MED ORDER — DOCUSATE SODIUM 100 MG PO CAPS
100.0000 mg | ORAL_CAPSULE | Freq: Once | ORAL | Status: AC
  Administered 2019-08-20: 100 mg via ORAL
  Filled 2019-08-20: qty 1

## 2019-08-20 MED ORDER — MINERAL OIL RE ENEM
1.0000 | ENEMA | Freq: Once | RECTAL | Status: AC
  Administered 2019-08-20: 1 via RECTAL

## 2019-08-20 MED ORDER — IOHEXOL 300 MG/ML  SOLN
100.0000 mL | Freq: Once | INTRAMUSCULAR | Status: AC | PRN
  Administered 2019-08-20: 02:00:00 100 mL via INTRAVENOUS

## 2019-08-20 MED ORDER — LACTULOSE 10 GM/15ML PO SOLN
20.0000 g | Freq: Once | ORAL | Status: AC
  Administered 2019-08-20: 20 g via ORAL
  Filled 2019-08-20: qty 30

## 2019-08-20 MED ORDER — LACTULOSE 20 GM/30ML PO SOLN: ORAL | 3 refills | Status: DC

## 2019-08-20 MED ORDER — MAGNESIUM CITRATE PO SOLN
1.0000 | Freq: Once | ORAL | Status: AC
  Administered 2019-08-20: 1 via ORAL
  Filled 2019-08-20: qty 296

## 2019-08-20 NOTE — Telephone Encounter (Signed)
Was lab ordered [facility]? She will go there to have TSH drawn just wanted to make sure they would be able to see?  Also, she went to ED 2/26 was given magnesium citrate & did have a small BM. Then she was so swollen when she went yesterday she looked 8 mos pregnant she said. Then they gave her that enema only to have some small "liquid like" stool. Not enough o give relief. Before going to ED 2/26 it had been two weeks since last BM. She said that ED physician thought that maybe enema just relieved liquid & went around the hard stool that is stuck or obstructing?

## 2019-08-20 NOTE — Telephone Encounter (Signed)
I called Rachel Alexander asking patient to please call back our office.

## 2019-08-20 NOTE — ED Notes (Signed)
Patient transported to CT 

## 2019-08-20 NOTE — Telephone Encounter (Signed)
Call pt Is she okay if I work her in Friday morning at 930? I would like to review the imaging and course of ED with her.  What medications did ed give her? Has she had success with miralax or lactulose? When was last BM?

## 2019-08-20 NOTE — Telephone Encounter (Signed)
Call pt Ask her to go have XR done at [facility] today.  When was last BM?  We will also recheck her tsh although too soon, ( like to check 4-6 weeks after synthroid start) however I want to ensure we are IMPROVING Ensure plenty of water, walking.  This will help stimulate gut. I have sent in lactulose, She may use every 4 hours until she has a BM. If no relief in the next 12-24 hrs,  I would advise to call us and return to ED.

## 2019-08-20 NOTE — ED Triage Notes (Signed)
Pt arrives POV for eval of ongoing constipation. Pt reports that she has tried enemas, mag citrate w/ no relief. Reports she received soap suds enema last night w/ just water output but no relief. Pt reports she feels as though she is impacted. States unable to keep anything down today, reports N/V all day today

## 2019-08-20 NOTE — ED Notes (Signed)
Patient is on the toilet at this time.

## 2019-08-20 NOTE — ED Notes (Signed)
Enema administered to patient

## 2019-08-20 NOTE — Telephone Encounter (Signed)
Patient does have appointment with you on 3/10. Any advise until then?

## 2019-08-20 NOTE — Telephone Encounter (Signed)
Pt called she has been having sever Constipation and has been seen in the ED several times and wanted to know if there was something she could do or take to help with this. She said that this is starting to effect her job

## 2019-08-20 NOTE — ED Notes (Signed)
Awaiting CT

## 2019-08-20 NOTE — Telephone Encounter (Signed)
Noted I spoke with patient

## 2019-08-20 NOTE — Addendum Note (Signed)
Addended by: Burnard Hawthorne on: 08/20/2019 03:12 PM   Modules accepted: Orders

## 2019-08-20 NOTE — Telephone Encounter (Signed)
Rachel Alexander,  How soon can patient see endocrine? I placed urgent appt.    My notes from call below:     Spoke with patient Has started  miralax every 4 hours.  Liquid brown stool. Passing gas.  No fever, vomiting.   Advised fleet enema, lactulose,  Or she may use miralax with gatorade Referral to endocrine urgent as concerned for underactive thyroid, failure to respond to synthroid

## 2019-08-20 NOTE — Telephone Encounter (Signed)
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 She said that she was out of work today & had been given a cup of lactulose plus an enema at ED. Also has been taking miralax mixed in water.This however only helped getting out some of the liquid she feels is in front of the hard stool. She said that physician in ED was convinced that this is related to her thyroid. Patient stated that she is just miserable.  Her appointment has been moved up to Monday at 10:30.

## 2019-08-21 ENCOUNTER — Ambulatory Visit
Admission: RE | Admit: 2019-08-21 | Discharge: 2019-08-21 | Disposition: A | Discharge status: Home / Self Care | Payer: Self-pay | Source: Ambulatory Visit | Attending: Family | Admitting: Family

## 2019-08-21 ENCOUNTER — Telehealth: Payer: Self-pay | Admitting: Family

## 2019-08-21 ENCOUNTER — Encounter: Payer: Self-pay | Admitting: Emergency Medicine

## 2019-08-21 ENCOUNTER — Inpatient Hospital Stay
Admission: EM | Admit: 2019-08-21 | Discharge: 2019-08-24 | DRG: 390 | Disposition: A | Discharge status: Home / Self Care | Payer: Self-pay | Attending: Internal Medicine | Admitting: Internal Medicine

## 2019-08-21 ENCOUNTER — Other Ambulatory Visit: Payer: Self-pay

## 2019-08-21 ENCOUNTER — Other Ambulatory Visit
Admission: RE | Admit: 2019-08-21 | Discharge: 2019-08-21 | Disposition: A | Discharge status: Home / Self Care | Payer: Self-pay | Source: Ambulatory Visit | Attending: Family | Admitting: Family

## 2019-08-21 DIAGNOSIS — K5900 Constipation, unspecified: Secondary | ICD-10-CM

## 2019-08-21 DIAGNOSIS — Z833 Family history of diabetes mellitus: Secondary | ICD-10-CM

## 2019-08-21 DIAGNOSIS — Z20822 Contact with and (suspected) exposure to covid-19: Secondary | ICD-10-CM | POA: Diagnosis present

## 2019-08-21 DIAGNOSIS — K5909 Other constipation: Secondary | ICD-10-CM

## 2019-08-21 DIAGNOSIS — E039 Hypothyroidism, unspecified: Secondary | ICD-10-CM | POA: Diagnosis present

## 2019-08-21 DIAGNOSIS — K567 Ileus, unspecified: Principal | ICD-10-CM | POA: Diagnosis present

## 2019-08-21 DIAGNOSIS — F419 Anxiety disorder, unspecified: Secondary | ICD-10-CM | POA: Diagnosis present

## 2019-08-21 DIAGNOSIS — E860 Dehydration: Secondary | ICD-10-CM | POA: Diagnosis present

## 2019-08-21 DIAGNOSIS — R109 Unspecified abdominal pain: Secondary | ICD-10-CM

## 2019-08-21 DIAGNOSIS — F329 Major depressive disorder, single episode, unspecified: Secondary | ICD-10-CM | POA: Diagnosis present

## 2019-08-21 DIAGNOSIS — K5901 Slow transit constipation: Secondary | ICD-10-CM | POA: Diagnosis present

## 2019-08-21 DIAGNOSIS — K56600 Partial intestinal obstruction, unspecified as to cause: Secondary | ICD-10-CM | POA: Diagnosis present

## 2019-08-21 DIAGNOSIS — Z79899 Other long term (current) drug therapy: Secondary | ICD-10-CM

## 2019-08-21 DIAGNOSIS — Z79890 Hormone replacement therapy (postmenopausal): Secondary | ICD-10-CM

## 2019-08-21 DIAGNOSIS — R899 Unspecified abnormal finding in specimens from other organs, systems and tissues: Secondary | ICD-10-CM | POA: Insufficient documentation

## 2019-08-21 DIAGNOSIS — F17290 Nicotine dependence, other tobacco product, uncomplicated: Secondary | ICD-10-CM | POA: Diagnosis present

## 2019-08-21 LAB — CBC WITH DIFFERENTIAL/PLATELET
Abs Immature Granulocytes: 0.01 10*3/uL (ref 0.00–0.07)
Abs Immature Granulocytes: 0.01 10*3/uL (ref 0.00–0.07)
Basophils Absolute: 0 10*3/uL (ref 0.0–0.1)
Basophils Absolute: 0 10*3/uL (ref 0.0–0.1)
Basophils Relative: 0 %
Basophils Relative: 0 %
Eosinophils Absolute: 0 10*3/uL (ref 0.0–0.5)
Eosinophils Absolute: 0 10*3/uL (ref 0.0–0.5)
Eosinophils Relative: 0 %
Eosinophils Relative: 0 %
HCT: 40.2 % (ref 36.0–46.0)
HCT: 39.8 % (ref 36.0–46.0)
Hemoglobin: 13 g/dL (ref 12.0–15.0)
Hemoglobin: 13 g/dL (ref 12.0–15.0)
Immature Granulocytes: 0 %
Immature Granulocytes: 0 %
Lymphocytes Relative: 52 %
Lymphocytes Relative: 54 %
Lymphs Abs: 3.1 10*3/uL (ref 0.7–4.0)
Lymphs Abs: 2.9 10*3/uL (ref 0.7–4.0)
MCH: 33.5 pg (ref 26.0–34.0)
MCH: 33.6 pg (ref 26.0–34.0)
MCHC: 32.3 g/dL (ref 30.0–36.0)
MCHC: 32.7 g/dL (ref 30.0–36.0)
MCV: 103.6 fL — ABNORMAL HIGH (ref 80.0–100.0)
MCV: 102.8 fL — ABNORMAL HIGH (ref 80.0–100.0)
Monocytes Absolute: 0.4 10*3/uL (ref 0.1–1.0)
Monocytes Absolute: 0.4 10*3/uL (ref 0.1–1.0)
Monocytes Relative: 7 %
Monocytes Relative: 7 %
Neutro Abs: 2.5 10*3/uL (ref 1.7–7.7)
Neutro Abs: 2.1 10*3/uL (ref 1.7–7.7)
Neutrophils Relative %: 41 %
Neutrophils Relative %: 39 %
Platelets: 261 10*3/uL (ref 150–400)
Platelets: 257 10*3/uL (ref 150–400)
RBC: 3.88 MIL/uL (ref 3.87–5.11)
RBC: 3.87 MIL/uL (ref 3.87–5.11)
RDW: 13 % (ref 11.5–15.5)
RDW: 13 % (ref 11.5–15.5)
WBC: 6 10*3/uL (ref 4.0–10.5)
WBC: 5.4 10*3/uL (ref 4.0–10.5)
nRBC: 0 % (ref 0.0–0.2)
nRBC: 0 % (ref 0.0–0.2)

## 2019-08-21 LAB — COMPREHENSIVE METABOLIC PANEL
ALT: 21 U/L (ref 0–44)
ALT: 18 U/L (ref 0–44)
AST: 23 U/L (ref 15–41)
AST: 20 U/L (ref 15–41)
Albumin: 4.3 g/dL (ref 3.5–5.0)
Albumin: 4.7 g/dL (ref 3.5–5.0)
Alkaline Phosphatase: 42 U/L (ref 38–126)
Alkaline Phosphatase: 46 U/L (ref 38–126)
Anion gap: 10 (ref 5–15)
Anion gap: 8 (ref 5–15)
BUN: <5 mg/dL — ABNORMAL LOW (ref 6–20)
BUN: 8 mg/dL (ref 6–20)
CO2: 25 mmol/L (ref 22–32)
CO2: 29 mmol/L (ref 22–32)
Calcium: 9.1 mg/dL (ref 8.9–10.3)
Calcium: 9.2 mg/dL (ref 8.9–10.3)
Chloride: 102 mmol/L (ref 98–111)
Chloride: 100 mmol/L (ref 98–111)
Creatinine, Ser: 0.75 mg/dL (ref 0.44–1.00)
Creatinine, Ser: 0.63 mg/dL (ref 0.44–1.00)
GFR calc Af Amer: >60 mL/min (ref >60)
GFR calc Af Amer: >60 mL/min (ref >60)
GFR calc non Af Amer: >60 mL/min (ref >60)
GFR calc non Af Amer: >60 mL/min (ref >60)
Glucose, Bld: 92 mg/dL (ref 70–99)
Glucose, Bld: 110 mg/dL — ABNORMAL HIGH (ref 70–99)
Potassium: 4 mmol/L (ref 3.5–5.1)
Potassium: 3.9 mmol/L (ref 3.5–5.1)
Sodium: 137 mmol/L (ref 135–145)
Sodium: 137 mmol/L (ref 135–145)
Total Bilirubin: 0.5 mg/dL (ref 0.3–1.2)
Total Bilirubin: 0.4 mg/dL (ref 0.3–1.2)
Total Protein: 6.9 g/dL (ref 6.5–8.1)
Total Protein: 7.5 g/dL (ref 6.5–8.1)

## 2019-08-21 LAB — LIPASE, BLOOD: Lipase: 23 U/L (ref 11–51)

## 2019-08-21 LAB — TSH: TSH: 326.394 u[IU]/mL — ABNORMAL HIGH (ref 0.350–4.500)

## 2019-08-21 MED ORDER — SODIUM CHLORIDE 0.9 % IV BOLUS
1000.0000 mL | Freq: Once | INTRAVENOUS | Status: AC
  Administered 2019-08-22: 1000 mL via INTRAVENOUS

## 2019-08-21 NOTE — Telephone Encounter (Signed)
Spoke with patient in regard to critical high TSH She was already in ED when I called.   She continues to have liquid brown bowel movements however very concerned she has an ileus.  Is my concern that I think this is related to her incredibly hypoactive thyroid.    She had been on armour thyroid 160mg   And prior to been on 120mg  mg.  She had been taking with celexa.   Changed to 88 mcg synthroid 3 weeks ago  I have given triage report and advised endocrine and GI consult.   Patient reading, no distress when we ended conversation.

## 2019-08-21 NOTE — ED Triage Notes (Addendum)
Pt presents to ED with worsening constipation for the past 2 weeks with right sided abd pain. Pt states she has been seen by ED MD at this hospital and by her pcp today but is feeling worse. Pt states she feels like she is dehydrated and that she isn't getting good circulation in her extremities and that she is 99% sure she has a blockage in her abd. Pt reports the only times she has had a bowel movement is when she is drinking mag citrate and getting an enema. abd xray today and abd CT 3/2.

## 2019-08-21 NOTE — ED Notes (Signed)
No answer in lobby for vitals recheck 

## 2019-08-21 NOTE — ED Notes (Signed)
No answer in lobby for vitals recheck. Pt moved otf

## 2019-08-21 NOTE — ED Provider Notes (Signed)
[facility] Emergency Department Provider Note  ____________________________________________   First MD Initiated Contact with Patient 08/21/19 2348     (approximate)  I have reviewed the triage vital signs and the nursing notes.   HISTORY  Chief Complaint Abdominal Pain and Constipation    HPI Rachel Alexander is a 39 y.o. female with below listed past medical history including hypothyroidism to recent visit to the emergency department secondary to abdominal discomfort and constipation returns to the ED secondary to continued constipation.  Patient states that she is only able have a bowel movement which she drinks magnesium citrate or has an enema.  Patient also admits to feelings of dehydration.  Patient states when she tries to have a bowel movement she feels like there is a blockage there that is precluding her from having a bowel movement and as a result she only has watery stools.  Patient was seen by me on 08/19/2019 at which point a CT scan was performed which revealed large amount of colonic stools consistent with constipation.  Patient made me aware of the fact that the dose of her Synthroid was recently changed secondary to hypothyroidism patient currently taking 88 mcg/day       Past Medical History:  Diagnosis Date  . Colitis   . Depression   . Frequent headaches   . Hypothyroidism   . Migraines   . Pituitary tumor   . Suicidal intent     Patient Active Problem List   Diagnosis Date Noted  . Fatigue 07/28/2019  . Headache 07/28/2019  . Stress incontinence 04/18/2019  . Encounter for smoking cessation counseling 04/18/2019  . Anxiety and depression 09/04/2018  . Attention deficit hyperactivity disorder (ADHD) 09/04/2018  . Colitis 05/04/2014  . Hypothyroidism 05/04/2014    Past Surgical History:  Procedure Laterality Date  . TONSILLECTOMY    . TUBAL LIGATION      Prior to Admission medications   Medication Sig Start Date End Date  Taking? Authorizing Provider  ALPRAZolam Duanne Moron) 1 MG tablet Take 1 tablet (1 mg total) by mouth 2 (two) times daily as needed for anxiety. 07/29/19   Burnard Hawthorne, FNP  amphetamine-dextroamphetamine (ADDERALL) 30 MG tablet Take 1 tablet by mouth 2 (two) times daily. 07/29/19   Burnard Hawthorne, FNP  citalopram (CELEXA) 20 MG tablet TAKE 1 TABLET BY MOUTH EVERY DAY 08/20/19   Burnard Hawthorne, FNP  Lactulose 20 GM/30ML SOLN Take 30 ml every 4 hours until constipation relieved.Call Doctor is no relief in 24 hours. 08/20/19   Burnard Hawthorne, FNP  levothyroxine (SYNTHROID) 88 MCG tablet Take 1 tablet (88 mcg total) by mouth daily before breakfast. 07/29/19   Burnard Hawthorne, FNP  venlafaxine XR (EFFEXOR XR) 37.5 MG 24 hr capsule Take 1 capsule (37.5 mg total) by mouth daily with breakfast. 07/28/19   Burnard Hawthorne, FNP  dicyclomine (BENTYL) 20 MG tablet Take 1 tablet (20 mg total) by mouth 2 (two) times daily. 08/06/14 09/25/14  Antonietta Breach, PA-C    Allergies Patient has no known allergies.  Family History  Problem Relation Age of Onset  . Diabetes Sister     Social History Social History   Tobacco Use  . Smoking status: Former Smoker    Packs/day: 1.00    Types: Cigarettes  . Smokeless tobacco: Never Used  Substance Use Topics  . Alcohol use: No  . Drug use: No    Review of Systems Constitutional: No fever/chills Eyes: No visual  changes. ENT: No sore throat. Cardiovascular: Denies chest pain. Respiratory: Denies shortness of breath. Gastrointestinal: Positive for abdominal discomfort and constipation.. Genitourinary: Negative for dysuria. Musculoskeletal: Negative for neck pain.  Negative for back pain. Integumentary: Negative for rash. Neurological: Negative for headaches, focal weakness or numbness.  ____________________________________________   PHYSICAL EXAM:  VITAL SIGNS: ED Triage Vitals  Enc Vitals Group     BP 08/21/19 2013 114/84     Pulse Rate  08/21/19 2013 82     Resp 08/21/19 2013 18     Temp 08/21/19 2013 98.2 F (36.8 C)     Temp src --      SpO2 08/21/19 2013 100 %     Weight 08/21/19 2014 55.3 kg (122 lb)     Height 08/21/19 2014 1.575 m (5\' 2" )     Head Circumference --      Peak Flow --      Pain Score 08/21/19 2014 8     Pain Loc --      Pain Edu? --      Excl. in [place]? --     Constitutional: Alert and oriented.  Eyes: Conjunctivae are normal.  Mouth/Throat: Patient is wearing a mask. Neck: No stridor.  No meningeal signs.   Cardiovascular: Normal rate, regular rhythm. Good peripheral circulation. Grossly normal heart sounds. Respiratory: Normal respiratory effort.  No retractions. Gastrointestinal: Soft and nontender. No distention.  Musculoskeletal: No lower extremity tenderness nor edema. No gross deformities of extremities. Neurologic:  Normal speech and language. No gross focal neurologic deficits are appreciated.  Skin:  Skin is warm, dry and intact. Psychiatric: Mood and affect are normal. Speech and behavior are normal.  ____________________________________________   LABS (all labs ordered are listed, but only abnormal results are displayed)  Labs Reviewed  CBC WITH DIFFERENTIAL/PLATELET - Abnormal; Notable for the following components:      Result Value   MCV 102.8 (*)    All other components within normal limits  COMPREHENSIVE METABOLIC PANEL - Abnormal; Notable for the following components:   Glucose, Bld 110 (*)    All other components within normal limits  TSH      Procedures   ____________________________________________   INITIAL IMPRESSION / MDM / [place] / ED COURSE  As part of my medical decision making, I reviewed the following data within the electronic MEDICAL RECORD NUMBER  39 year old female presented with above-stated history and physical exam secondary to constipation most likely secondary to hypothyroidism that is poorly controlled.  Review of the patient's  chart revealed a TSH of 326 at 6:07PM last night repeat TSH currently 229.  Patient given 2 L IV normal saline in the emergency department.  Patient discussed with hospitalist after admission for ileus and endocrine consultation. ____________________________________________  FINAL CLINICAL IMPRESSION(S) / ED DIAGNOSES  Final diagnoses:  Ileus ([place])  Constipation, unspecified constipation type     MEDICATIONS GIVEN DURING THIS VISIT:  Medications - No data to display   ED Discharge Orders    None      *Please note:  Rachel Alexander was evaluated in Emergency Department on 08/21/2019 for the symptoms described in the history of present illness. She was evaluated in the context of the global COVID-19 pandemic, which necessitated consideration that the patient might be at risk for infection with the SARS-CoV-2 virus that causes COVID-19. Institutional protocols and algorithms that pertain to the evaluation of patients at risk for COVID-19 are in a state of rapid change based on information  released by regulatory bodies including the CDC and federal and state organizations. These policies and algorithms were followed during the patient's care in the ED.  Some ED evaluations and interventions may be delayed as a result of limited staffing during the pandemic.*  Note:  This document was prepared using Dragon voice recognition software and may include unintentional dictation errors.   Gregor Hams, MD 08/22/19 0157

## 2019-08-21 NOTE — ED Notes (Signed)
No answer in lobby for vital recheck

## 2019-08-22 ENCOUNTER — Telehealth: Payer: Self-pay | Admitting: Family

## 2019-08-22 ENCOUNTER — Encounter: Payer: Self-pay | Admitting: Internal Medicine

## 2019-08-22 ENCOUNTER — Other Ambulatory Visit: Payer: Self-pay

## 2019-08-22 DIAGNOSIS — F329 Major depressive disorder, single episode, unspecified: Secondary | ICD-10-CM

## 2019-08-22 DIAGNOSIS — K5901 Slow transit constipation: Secondary | ICD-10-CM

## 2019-08-22 DIAGNOSIS — F32A Depression, unspecified: Secondary | ICD-10-CM

## 2019-08-22 DIAGNOSIS — K567 Ileus, unspecified: Principal | ICD-10-CM

## 2019-08-22 DIAGNOSIS — F419 Anxiety disorder, unspecified: Secondary | ICD-10-CM

## 2019-08-22 DIAGNOSIS — E039 Hypothyroidism, unspecified: Secondary | ICD-10-CM

## 2019-08-22 DIAGNOSIS — K5900 Constipation, unspecified: Secondary | ICD-10-CM

## 2019-08-22 LAB — CBC
HCT: 34.4 % — ABNORMAL LOW (ref 36.0–46.0)
Hemoglobin: 11.1 g/dL — ABNORMAL LOW (ref 12.0–15.0)
MCH: 33.3 pg (ref 26.0–34.0)
MCHC: 32.3 g/dL (ref 30.0–36.0)
MCV: 103.3 fL — ABNORMAL HIGH (ref 80.0–100.0)
Platelets: 221 10*3/uL (ref 150–400)
RBC: 3.33 MIL/uL — ABNORMAL LOW (ref 3.87–5.11)
RDW: 12.8 % (ref 11.5–15.5)
WBC: 4.9 10*3/uL (ref 4.0–10.5)
nRBC: 0 % (ref 0.0–0.2)

## 2019-08-22 LAB — BASIC METABOLIC PANEL
Anion gap: 5 (ref 5–15)
BUN: 7 mg/dL (ref 6–20)
CO2: 28 mmol/L (ref 22–32)
Calcium: 7.8 mg/dL — ABNORMAL LOW (ref 8.9–10.3)
Chloride: 106 mmol/L (ref 98–111)
Creatinine, Ser: 0.54 mg/dL (ref 0.44–1.00)
GFR calc Af Amer: >60 mL/min (ref >60)
GFR calc non Af Amer: >60 mL/min (ref >60)
Glucose, Bld: 86 mg/dL (ref 70–99)
Potassium: 4 mmol/L (ref 3.5–5.1)
Sodium: 139 mmol/L (ref 135–145)

## 2019-08-22 LAB — HIV ANTIBODY (ROUTINE TESTING W REFLEX): HIV Screen 4th Generation wRfx: NONREACTIVE

## 2019-08-22 LAB — TSH: TSH: 229.587 u[IU]/mL — ABNORMAL HIGH (ref 0.350–4.500)

## 2019-08-22 LAB — T4, FREE: Free T4: 0.35 ng/dL — ABNORMAL LOW (ref 0.61–1.12)

## 2019-08-22 LAB — SARS CORONAVIRUS 2 (TAT 6-24 HRS): SARS Coronavirus 2: NEGATIVE

## 2019-08-22 MED ORDER — ACETAMINOPHEN 325 MG PO TABS: 650.0000 mg | ORAL_TABLET | Freq: Three times a day (TID) | ORAL | Status: DC | PRN

## 2019-08-22 MED ORDER — ALPRAZOLAM 1 MG PO TABS: 1.0000 mg | ORAL_TABLET | Freq: Two times a day (BID) | ORAL | 1 refills | Status: DC | PRN

## 2019-08-22 MED ORDER — PEG 3350-KCL-NA BICARB-NACL 420 G PO SOLR
4000.0000 mL | Freq: Once | ORAL | Status: AC
  Administered 2019-08-22: 4000 mL via ORAL
  Filled 2019-08-22: qty 4000

## 2019-08-22 MED ORDER — ENOXAPARIN SODIUM 40 MG/0.4ML ~~LOC~~ SOLN
40.0000 mg | SUBCUTANEOUS | Status: DC
  Administered 2019-08-22 – 2019-08-23 (×2): 40 mg via SUBCUTANEOUS
  Filled 2019-08-22 (×2): qty 0.4

## 2019-08-22 MED ORDER — ALPRAZOLAM 0.5 MG PO TABS
1.0000 mg | ORAL_TABLET | Freq: Two times a day (BID) | ORAL | Status: DC | PRN
  Administered 2019-08-22 – 2019-08-23 (×3): 1 mg via ORAL
  Filled 2019-08-22 (×3): qty 2

## 2019-08-22 MED ORDER — CITALOPRAM HYDROBROMIDE 20 MG PO TABS: 20.0000 mg | ORAL_TABLET | Freq: Every day | ORAL | Status: DC

## 2019-08-22 MED ORDER — AMPHETAMINE-DEXTROAMPHETAMINE 10 MG PO TABS
30.0000 mg | ORAL_TABLET | Freq: Two times a day (BID) | ORAL | Status: DC
  Administered 2019-08-22 – 2019-08-24 (×4): 30 mg via ORAL
  Filled 2019-08-22 (×6): qty 3

## 2019-08-22 MED ORDER — DEXTROSE-NACL 5-0.9 % IV SOLN: INTRAVENOUS | Status: DC

## 2019-08-22 MED ORDER — INFLUENZA VAC SPLIT QUAD 0.5 ML IM SUSY: 0.5000 mL | PREFILLED_SYRINGE | INTRAMUSCULAR | Status: DC

## 2019-08-22 MED ORDER — MORPHINE SULFATE (PF) 2 MG/ML IV SOLN
INTRAVENOUS | Status: AC
  Filled 2019-08-22: qty 1

## 2019-08-22 MED ORDER — KETOROLAC TROMETHAMINE 30 MG/ML IJ SOLN
30.0000 mg | Freq: Once | INTRAMUSCULAR | Status: AC
  Administered 2019-08-23: 30 mg via INTRAVENOUS
  Filled 2019-08-22: qty 1

## 2019-08-22 MED ORDER — FLEET ENEMA 7-19 GM/118ML RE ENEM: 1.0000 | ENEMA | Freq: Once | RECTAL | Status: DC | PRN

## 2019-08-22 MED ORDER — SODIUM CHLORIDE 0.9 % IV SOLN
INTRAVENOUS | Status: DC
  Administered 2019-08-24: 20 mL/h via INTRAVENOUS

## 2019-08-22 MED ORDER — BISACODYL 10 MG RE SUPP
10.0000 mg | Freq: Every day | RECTAL | Status: DC | PRN
  Administered 2019-08-22: 10 mg via RECTAL
  Filled 2019-08-22 (×2): qty 1

## 2019-08-22 MED ORDER — LEVOTHYROXINE SODIUM 50 MCG PO TABS
150.0000 ug | ORAL_TABLET | Freq: Every day | ORAL | Status: DC
  Administered 2019-08-22 – 2019-08-24 (×3): 150 ug via ORAL
  Filled 2019-08-22 (×3): qty 1

## 2019-08-22 MED ORDER — MORPHINE SULFATE (PF) 2 MG/ML IV SOLN: 2.0000 mg | Freq: Once | INTRAVENOUS | Status: DC

## 2019-08-22 MED ORDER — VENLAFAXINE HCL ER 37.5 MG PO CP24
37.5000 mg | ORAL_CAPSULE | Freq: Every day | ORAL | Status: DC
  Administered 2019-08-22 – 2019-08-24 (×3): 37.5 mg via ORAL
  Filled 2019-08-22 (×3): qty 1

## 2019-08-22 MED ORDER — SODIUM CHLORIDE 0.9 % IV SOLN: INTRAVENOUS | Status: DC

## 2019-08-22 NOTE — Telephone Encounter (Signed)
Pt husband called and wanted Joycelyn Schmid to give his wife a call back asap

## 2019-08-22 NOTE — ED Notes (Signed)
Attempted to call report x 1. No answer after hold for 9:00 minutes

## 2019-08-22 NOTE — Telephone Encounter (Signed)
Joycelyn Schmid has called & spoke with patient's husband as well as LM for patient.

## 2019-08-22 NOTE — H&P (Signed)
Chief Complaint: I have abdominal pain and have been constipated for the past 2 weeks  HPI: Rachel Alexander is a 39 y.o. female with anxiety disorder, depression and Hypothyroidism for several years.  She has been in the ED recently on multiple visits on account of constipation over the past 2 weeks. Despite outpatient treatment, patient continues to have no bowel movements. She has tried enema at home and manual disimpaction without any success. She feels dehydrated and miserable hence decided to come to the ED for the second time in 2 days . CT scan from 03/03 reviewed shows evidence of stool impaction and ? Ileus. Repeat xray of the abdomen today was concerning for scattered large and small bowel gas. The amount of fecal material within the colon however seems improved.Patient admits to adbominal discomfort but denies any tenderness on palpation. Denies any fever. She feels cold and admits hair has been falling out lately.denies any suicidal ideations or intent at this time.  Past Medical History:  Diagnosis Date  . Colitis   . Depression   . Frequent headaches   . Hypothyroidism   . Migraines   . Pituitary tumor   . Suicidal intent     Past Surgical History:  Procedure Laterality Date  . TONSILLECTOMY    . TUBAL LIGATION      Family History  Problem Relation Age of Onset  . Diabetes Sister    Social History: She has a history of tobacco smoking but quit several months ago. Currently occasionally vapes using smokeless tobacco products.   Allergies: No Known Allergies  (Not in a hospital admission)   Results for orders placed or performed during the hospital encounter of 08/21/19 (from the past 48 hour(s))  CBC with Differential     Status: Abnormal   Collection Time: 08/21/19  8:19 PM  Result Value Ref Range   WBC 5.4 4.0 - 10.5 K/uL   RBC 3.87 3.87 - 5.11 MIL/uL   Hemoglobin 13.0 12.0 - 15.0 g/dL   HCT 39.8 36.0 - 46.0 %   MCV 102.8 (H) 80.0 - 100.0 fL   MCH 33.6 26.0 -  34.0 pg   MCHC 32.7 30.0 - 36.0 g/dL   RDW 13.0 11.5 - 15.5 %   Platelets 257 150 - 400 K/uL   nRBC 0.0 0.0 - 0.2 %   Neutrophils Relative % 39 %   Neutro Abs 2.1 1.7 - 7.7 K/uL   Lymphocytes Relative 54 %   Lymphs Abs 2.9 0.7 - 4.0 K/uL   Monocytes Relative 7 %   Monocytes Absolute 0.4 0.1 - 1.0 K/uL   Eosinophils Relative 0 %   Eosinophils Absolute 0.0 0.0 - 0.5 K/uL   Basophils Relative 0 %   Basophils Absolute 0.0 0.0 - 0.1 K/uL   Immature Granulocytes 0 %   Abs Immature Granulocytes 0.01 0.00 - 0.07 K/uL    Comment: Performed at [facility], [address] [place], [place] [address]  Comprehensive metabolic panel     Status: Abnormal   Collection Time: 08/21/19  8:19 PM  Result Value Ref Range   Sodium 137 135 - 145 mmol/L   Potassium 3.9 3.5 - 5.1 mmol/L   Chloride 100 98 - 111 mmol/L   CO2 29 22 - 32 mmol/L   Glucose, Bld 110 (H) 70 - 99 mg/dL    Comment: Glucose reference range applies only to samples taken after fasting for at least 8 hours.   BUN 8 6 - 20 mg/dL  Creatinine, Ser 0.63 0.44 - 1.00 mg/dL   Calcium 9.2 8.9 - 10.3 mg/dL   Total Protein 7.5 6.5 - 8.1 g/dL   Albumin 4.7 3.5 - 5.0 g/dL   AST 20 15 - 41 U/L   ALT 18 0 - 44 U/L   Alkaline Phosphatase 46 38 - 126 U/L   Total Bilirubin 0.4 0.3 - 1.2 mg/dL   GFR calc non Af Amer >60 >60 mL/min   GFR calc Af Amer >60 >60 mL/min   Anion gap 8 5 - 15    Comment: Performed at [facility], [address] [place], [place] [address]   CT ABDOMEN PELVIS W CONTRAST  Result Date: 08/20/2019 CLINICAL DATA:  Abdominal pain EXAM: CT ABDOMEN AND PELVIS WITH CONTRAST TECHNIQUE: Multidetector CT imaging of the abdomen and pelvis was performed using the standard protocol following bolus administration of intravenous contrast. CONTRAST:  174mL OMNIPAQUE IOHEXOL 300 MG/ML  SOLN COMPARISON:  None. FINDINGS: Lower chest: The visualized heart size within normal limits. No pericardial fluid/thickening. No  hiatal hernia. The visualized portions of the lungs are clear. Hepatobiliary: The liver is normal in density without focal abnormality.The main portal vein is patent. No evidence of calcified gallstones, gallbladder wall thickening or biliary dilatation. Pancreas: Unremarkable. No pancreatic ductal dilatation or surrounding inflammatory changes. Spleen: Normal in size without focal abnormality. Adrenals/Urinary Tract: Both adrenal glands appear normal. The kidneys and collecting system appear normal without evidence of urinary tract calculus or hydronephrosis. Bladder is unremarkable. Stomach/Bowel: The stomach and small bowel are normal in appearance. There is a large amount of colonic stool seen throughout. Mildly prominent stool-filled loops of transverse colon are seen measuring up to 5.9 cm. There is air and stool seen in a mildly dilated rectum. No focal fat stranding changes or bowel wall thickening are seen. The appendix is unremarkable. Vascular/Lymphatic: There are no enlarged mesenteric, retroperitoneal, or pelvic lymph nodes. No significant vascular findings are present. Reproductive: The uterus and adnexa are unremarkable. Other: No evidence of abdominal wall mass or hernia. Musculoskeletal: No acute or significant osseous findings. IMPRESSION: Large amount of colonic stool seen throughout with mildly prominent loops of transverse colon and rectum which could be due to constipation and ileus. Electronically Signed   By: Prudencio Pair M.D.   On: 08/20/2019 02:03    Review of Systems: 12 point system was reviewed. Pertinent positive and negatives as noted in HPI.Admits to vomiting episode last night. Vomitus was clear, non bloody.  Blood pressure 100/76, pulse 77, temperature 98.2 F (36.8 C), resp. rate 18, height 5\' 2"  (1.575 m), weight 55.3 kg, last menstrual period 08/15/2019, SpO2 100 %. Physical Exam  Patient is a frail caucasian female, NAD. Looks uncomfortable but not in acute distress.  Head is atraumatic Neck supple Chest: Clinically clear Abdomen: Soft, non tender, non distended, mild guarding on LLQ CNS: Shows no focal deficits. Cranial nerves intact Skin: dry and warm GUS: Deferred Psych:  Judgement and mood intact with no obvious mood swings Skin: negative for rash    Assessment/Plan 1. Acute Constipation/Obstipation with evidence of distended loops of large and small bowels- Present on admission.Patient failed outpatient treatment. Trial of fleet enema will be offered as per protocol for severe constipation. Trial of warm water enemas and senokot suppository will also be offered for symptomatic treatment. If no improvement, patient will need to be considered for GI evaluation for manual disimpaction /sigmoidoscopy. Will defer GI consult for now  2. Acute on chronic Hypothyroidism: TSH  levels extremely high. Patient is adamant she has been judiciously compliant with synthroid medications offered. Hypothyroidism likely to be contributing to symptoms of constipation.Notably her dose of Synthroid were decreased to 52mcg daily for unclear reason 3 weeks ago.  3. History of depression: On Citalopram and Venlafaxine .Also on aderall. Patient will continue with same with caution  4. History of SI- Denies any ideations or intent on this admission.  5. VTE prophylaxis and education.  [place], MD 08/22/2019, 12:57 AM

## 2019-08-22 NOTE — Consult Note (Signed)
Jonathon Bellows , MD [address] [place], [place], [place], [address] [id number] [place], [place], [place], [address] Phone: [phone]  Fax: [phone]  Consultation  Referring Provider:  Dr Kurtis Bushman Primary Care Physician:  Burnard Hawthorne, FNP Primary Gastroenterologist:  None         Reason for Consultation:     Consultation requested by patient's husband for constipation  Date of Admission:  08/21/2019 Date of Consultation:  08/22/2019         HPI:   Rachel Alexander is a 39 y.o. female admitted to the hospital earlier today when she presented to the hospital with constipation .  Her Toprol was added to the ER over the past 2 weeks for constipation.  Tried manual disimpaction at home.  Has severe hypothyroidism.  In fact her hypothyroidism has got worse with the TSH rising.  CT scan performed 2 days back demonstrates large amount of colonic stool present throughout with mildly prominent loops of transverse colon rectum which could be due to constipation or ileus.  X-ray performed earlier today shows marked improvement compared to previous imaging.  Hemoglobin is 11.1 g and TSH is 229 which was 326 a day back and 94, 3 weeks back.  States that she has had constipation for many years.  Can go up to 2 weeks with 4 she has a bowel movement.  Based on her history it does not appear that her hypothyroidism was controlled at any point in the recent past.  Denies any prior colonoscopy.  She feels like when she tries to have a bowel movement has a mass protruding from her rectum and is concerned.  No rectal bleeding.  Past Medical History:  Diagnosis Date  . Colitis   . Depression   . Frequent headaches   . Hypothyroidism   . Migraines   . Pituitary tumor   . Suicidal intent     Past Surgical History:  Procedure Laterality Date  . TONSILLECTOMY    . TUBAL LIGATION      Prior to Admission medications   Medication Sig Start Date End Date Taking? Authorizing Provider  ALPRAZolam  Duanne Moron) 1 MG tablet Take 1 tablet (1 mg total) by mouth 2 (two) times daily as needed for anxiety. 07/29/19  Yes Arnett, Yvetta Coder, FNP  amphetamine-dextroamphetamine (ADDERALL) 30 MG tablet Take 1 tablet by mouth 2 (two) times daily. 07/29/19  Yes Burnard Hawthorne, FNP  cholecalciferol (VITAMIN D3) 25 MCG (1000 UNIT) tablet Take 1,000 Units by mouth daily.   Yes [provider]  levothyroxine (SYNTHROID) 88 MCG tablet Take 1 tablet (88 mcg total) by mouth daily before breakfast. 07/29/19  Yes Burnard Hawthorne, FNP  venlafaxine XR (EFFEXOR XR) 37.5 MG 24 hr capsule Take 1 capsule (37.5 mg total) by mouth daily with breakfast. 07/28/19  Yes Arnett, Yvetta Coder, FNP  citalopram (CELEXA) 20 MG tablet TAKE 1 TABLET BY MOUTH EVERY DAY Patient not taking: No sig reported 08/20/19   Burnard Hawthorne, FNP  Lactulose 20 GM/30ML SOLN Take 30 ml every 4 hours until constipation relieved.Call Doctor is no relief in 24 hours. Patient not taking: Reported on 08/22/2019 08/20/19   Burnard Hawthorne, FNP  dicyclomine (BENTYL) 20 MG tablet Take 1 tablet (20 mg total) by mouth 2 (two) times daily. 08/06/14 09/25/14  Antonietta Breach, PA-C    Family History  Problem Relation Age of Onset  . Diabetes Sister      Social History   Tobacco  Use  . Smoking status: Former Smoker    Packs/day: 1.00    Types: Cigarettes  . Smokeless tobacco: Never Used  Substance Use Topics  . Alcohol use: No  . Drug use: No    Allergies as of 08/21/2019  . (No Known Allergies)    Review of Systems:    All systems reviewed and negative except where noted in HPI.   Physical Exam:  Vital signs in last 24 hours: Temp:  [98.1 F (36.7 C)-98.2 F (36.8 C)] 98.1 F (36.7 C) (03/05 0257) Pulse Rate:  [69-82] 75 (03/05 0257) Resp:  [16-20] 16 (03/05 0257) BP: (94-114)/(68-84) 94/74 (03/05 0257) SpO2:  [100 %] 100 % (03/05 0257) Weight:  [55.3 kg] 55.3 kg (03/04 2014)   General:   Pleasant, cooperative in NAD Head:   Normocephalic and atraumatic. Eyes:   No icterus.   Conjunctiva pink. PERRLA. Ears:  Normal auditory acuity. Lungs: Respirations even and unlabored. Lungs clear to auscultation bilaterally.   No wheezes, crackles, or rhonchi.  Heart:  Regular rate and rhythm;  Without murmur, clicks, rubs or gallops Abdomen:  Soft, nondistended, nontender. Normal bowel sounds. No appreciable masses or hepatomegaly.  No rebound or guarding.  Neurologic:  Alert and oriented x3;  grossly normal neurologically. Skin:  Intact without significant lesions or rashes. Psych:  Alert and cooperative. Normal affect.  LAB RESULTS: Recent Labs    08/21/19 1807 08/21/19 2019 08/22/19 0212  WBC 6.0 5.4 4.9  HGB 13.0 13.0 11.1*  HCT 40.2 39.8 34.4*  PLT 261 257 221   BMET Recent Labs    08/20/19 2253 08/21/19 2019 08/22/19 0212  NA 137 137 139  K 4.0 3.9 4.0  CL 102 100 106  CO2 25 29 28   GLUCOSE 92 110* 86  BUN <5* 8 7  CREATININE 0.75 0.63 0.54  CALCIUM 9.1 9.2 7.8*   LFT Recent Labs    08/21/19 2019  PROT 7.5  ALBUMIN 4.7  AST 20  ALT 18  ALKPHOS 46  BILITOT 0.4   PT/INR No results for input(s): LABPROT, INR in the last 72 hours.  STUDIES: DG Abd 2 Views  Result Date: 08/22/2019 CLINICAL DATA:  Abdominal pain and constipation for several days EXAM: ABDOMEN - 2 VIEW COMPARISON:  08/20/2019 FINDINGS: Scattered large and small bowel gas is noted. The amount of fecal material within the colon has improved significantly from the previous day. No obstructive changes are noted. No free air is seen. No bony abnormality is noted. IMPRESSION: Improvement in the degree of constipation when compared with the prior exam. Electronically Signed   By: Inez Catalina M.D.   On: 08/22/2019 00:59      Impression / Plan:   Rachel Alexander is a 39 y.o. y/o female with history of hypothyroidism presents to the emergency room with worsening of constipation.  If you compare the imaging it appears that the x-ray from  today appears to be much better than what it was 2 days back.  I have been asked to see the patient by the patient's husband.  Plan 1.  Most likely etiology for worsening of constipation is severe hypothyroidism.  Unfortunately no T3 or T4 rechecked to determine the levels would suggest to check and follow-up on that.  2.  Constipation would most likely improve when hypothyroidism is treated.  3.  Suggest to commence on GoLYTELY to clean out the colon which would make her feel significantly better.  We could also get an x-ray of  the abdomen tomorrow to ensure that her stool has been cleared of the colon.  We will plan to perform a colonoscopy on Sunday.  She has an abnormality in the rectum.  I have discussed alternative options, risks & benefits,  which include, but are not limited to, bleeding, infection, perforation,respiratory complication & drug reaction.  The patient agrees with this plan & written consent will be obtained.    Thank you for involving me in the care of this patient.      LOS: 0 days   Jonathon Bellows, MD  08/22/2019, 12:09 PM

## 2019-08-22 NOTE — Plan of Care (Signed)
  Problem: Health Behavior/Discharge Planning: Goal: Ability to manage health-related needs will improve Outcome: Progressing   Problem: Clinical Measurements: Goal: Ability to maintain clinical measurements within normal limits will improve Outcome: Progressing Goal: Will remain free from infection Outcome: Progressing Goal: Diagnostic test results will improve Outcome: Progressing Goal: Respiratory complications will improve Outcome: Progressing   Problem: Activity: Goal: Risk for activity intolerance will decrease Outcome: Progressing   Problem: Coping: Goal: Level of anxiety will decrease Outcome: Progressing   Problem: Pain Managment: Goal: General experience of comfort will improve Outcome: Progressing   Problem: Safety: Goal: Ability to remain free from injury will improve Outcome: Progressing   Problem: Skin Integrity: Goal: Risk for impaired skin integrity will decrease Outcome: Progressing

## 2019-08-22 NOTE — ED Notes (Signed)
PT provided warm blanket

## 2019-08-22 NOTE — Telephone Encounter (Signed)
Feels well. Abdominal pain has improved.  She notes that the was taking generic synthroid.  Has  Met Dr Vicente Males and colonoscopy planned for Sunday.  She will follow up with me on Monday

## 2019-08-22 NOTE — ED Notes (Signed)
Iv obtained, blood sent to lab, pt given warm blankets.

## 2019-08-22 NOTE — ED Notes (Signed)
PTreports feeling tired at this time and asks for door shut so she can sleep

## 2019-08-22 NOTE — Telephone Encounter (Signed)
Would you be willing to call patient back. Husband is now there with wife.

## 2019-08-22 NOTE — Telephone Encounter (Signed)
Danella Deis pt's husband is requesting a call back about wife being admitted to hospital last night.

## 2019-08-22 NOTE — Telephone Encounter (Signed)
Spoke with husband, shane.   He is on is why to hospital to see wife after he showers this morning.  Wanted to let me know that she has been struggling with constipation and didn't want to say anything as she was embarrassed. Prior to this month, it was normal for her to not have a BM for one week. This has been going on for some time.  He noted that wife told him they have increased her Synthroid dose and she is on clear liquid diet. Continues to have liquid brown stool. No large BM. Nurses are placing suppositories.   I advised that I had agreed with management of hospitalists and would certainly follow along. I have left a  Message with wife to call us with any questions of concerns as well.

## 2019-08-22 NOTE — Progress Notes (Signed)
PROGRESS NOTE    Rachel Alexander  [id number] DOB: 03/30/1981 DOA: 08/21/2019 PCP: Burnard Hawthorne, FNP    Brief Narrative:  Rachel Alexander is a 39 y.o. female admitted to the hospital earlier today when she presented to the hospital with constipation .  Her Toprol was added to the ER over the past 2 weeks for constipation.  Has tried everything as outpatient and reports has not helped.    Consultants:   none  Procedures: CT  Antimicrobials:   None   Subjective: Received enema this morning.  Still no bowel movement.  Complaining of discomfort and very concerned about constipation.  Little bit of nausea.  Objective: Vitals:   08/22/19 0130 08/22/19 0200 08/22/19 0257 08/22/19 1214  BP: 94/79 100/68 94/74 104/72  Pulse: 70 72 75 78  Resp: 18 18 16 16   Temp:   98.1 F (36.7 C) 98.5 F (36.9 C)  TempSrc:   Oral Oral  SpO2: 100% 100% 100% 100%  Weight:      Height:        Intake/Output Summary (Last 24 hours) at 08/22/2019 1345 Last data filed at 08/22/2019 0426 Gross per 24 hour  Intake 2194.55 ml  Output --  Net 2194.55 ml   Filed Weights   08/21/19 2014  Weight: 55.3 kg    Examination:  General exam: Appears calm and comfortable  Respiratory system: Clear to auscultation. Respiratory effort normal. Cardiovascular system: S1 & S2 heard, RRR. No JVD, murmurs, rubs, gallops or clicks. Gastrointestinal system: Abdomen is nondistended, soft and nontender. No organomegaly or masses felt. Normal bowel sounds heard. Central nervous system: Alert and oriented. No focal neurological deficits. Extremities: No edema Skin: Warm dry Psychiatry: Judgement and insight appear normal. Mood & affect appropriate.     Data Reviewed: I have personally reviewed following labs and imaging studies  CBC: Recent Labs  Lab 08/19/19 2124 08/20/19 2253 08/21/19 1807 08/21/19 2019 08/22/19 0212  WBC 5.9 6.2 6.0 5.4 4.9  NEUTROABS  --   --  2.5 2.1  --   HGB 12.5 12.6 13.0  13.0 11.1*  HCT 38.2 38.1 40.2 39.8 34.4*  MCV 103.0* 101.6* 103.6* 102.8* 103.3*  PLT 246 258 261 257 [id number]   Basic Metabolic Panel: Recent Labs  Lab 08/15/19 1418 08/19/19 2124 08/20/19 2253 08/21/19 2019 08/22/19 0212  NA 139 137 137 137 139  K 3.8 3.6 4.0 3.9 4.0  CL 103 96* 102 100 106  CO2 28 29 25 29 28   GLUCOSE 100* 94 92 110* 86  BUN 6 11 <5* 8 7  CREATININE 0.69 0.70 0.75 0.63 0.54  CALCIUM 8.6* 9.2 9.1 9.2 7.8*   GFR: Estimated Creatinine Clearance: 74.7 mL/min (by C-G formula based on SCr of 0.54 mg/dL). Liver Function Tests: Recent Labs  Lab 08/15/19 1418 08/19/19 2124 08/20/19 2253 08/21/19 2019  AST 22 22 23 20   ALT 16 18 21 18   ALKPHOS 40 43 42 46  BILITOT 0.4 0.3 0.5 0.4  PROT 7.2 7.5 6.9 7.5  ALBUMIN 4.4 4.7 4.3 4.7   Recent Labs  Lab 08/15/19 1418 08/19/19 2124 08/20/19 2253  LIPASE 19 22 23    No results for input(s): AMMONIA in the last 168 hours. Coagulation Profile: No results for input(s): INR, PROTIME in the last 168 hours. Cardiac Enzymes: No results for input(s): CKTOTAL, CKMB, CKMBINDEX, TROPONINI in the last 168 hours. BNP (last 3 results) No results for input(s): PROBNP in the last 8760 hours. HbA1C: No results for input(s):  HGBA1C in the last 72 hours. CBG: No results for input(s): GLUCAP in the last 168 hours. Lipid Profile: No results for input(s): CHOL, HDL, LDLCALC, TRIG, CHOLHDL, LDLDIRECT in the last 72 hours. Thyroid Function Tests: Recent Labs    08/21/19 0830 08/21/19 1807 08/21/19 2019  TSH  --    < > 229.587*  FREET4 0.35*  --   --    < > = values in this interval not displayed.   Anemia Panel: No results for input(s): VITAMINB12, FOLATE, FERRITIN, TIBC, IRON, RETICCTPCT in the last 72 hours. Sepsis Labs: No results for input(s): PROCALCITON, LATICACIDVEN in the last 168 hours.  Recent Results (from the past 240 hour(s))  SARS CORONAVIRUS 2 (TAT 6-24 HRS) Nasopharyngeal Nasopharyngeal Swab     Status:  None   Collection Time: 08/22/19  1:39 AM   Specimen: Nasopharyngeal Swab  Result Value Ref Range Status   SARS Coronavirus 2 NEGATIVE NEGATIVE Final    Comment: (NOTE) SARS-CoV-2 target nucleic acids are NOT DETECTED. The SARS-CoV-2 RNA is generally detectable in upper and lower respiratory specimens during the acute phase of infection. Negative results do not preclude SARS-CoV-2 infection, do not rule out co-infections with other pathogens, and should not be used as the sole basis for treatment or other patient management decisions. Negative results must be combined with clinical observations, patient history, and epidemiological information. The expected result is Negative. Fact Sheet for Patients: SugarRoll.be Fact Sheet for Healthcare Providers: https://www.woods-mathews.com/ This test is not yet approved or cleared by the [place] FDA and  has been authorized for detection and/or diagnosis of SARS-CoV-2 by FDA under an Emergency Use Authorization (EUA). This EUA will remain  in effect (meaning this test can be used) for the duration of the COVID-19 declaration under Section 56 4(b)(1) of the Act, 21 U.S.C. section 360bbb-3(b)(1), unless the authorization is terminated or revoked sooner. Performed at [facility], [place] [address], [place], [place] [address]          Radiology Studies: DG Abd 2 Views  Result Date: 08/22/2019 CLINICAL DATA:  Abdominal pain and constipation for several days EXAM: ABDOMEN - 2 VIEW COMPARISON:  08/20/2019 FINDINGS: Scattered large and small bowel gas is noted. The amount of fecal material within the colon has improved significantly from the previous day. No obstructive changes are noted. No free air is seen. No bony abnormality is noted. IMPRESSION: Improvement in the degree of constipation when compared with the prior exam. Electronically Signed   By: Inez Catalina M.D.   On: 08/22/2019 00:59         Scheduled Meds: . amphetamine-dextroamphetamine  30 mg Oral BID  . enoxaparin (LOVENOX) injection  40 mg Subcutaneous Q24H  . [START ON 08/23/2019] influenza vac split quadrivalent PF  0.5 mL Intramuscular Tomorrow-1000  . levothyroxine  150 mcg Oral Q0600  . venlafaxine XR  37.5 mg Oral Q breakfast   Continuous Infusions: . sodium chloride    . sodium chloride      Assessment & Plan:   Active Problems:   Ileus, unspecified ([place])   1. Acute Constipation/Obstipation- failed outpt regimen. Given fleet enema. Spoke to GI , recommentded Golytly. GI consulted as pt wants to talk to them in person.  Plan for cscope on Sunday. Avoid pain meds   2.. Acute on chronic Hypothyroidism: TSH levels extremely high. Patient is adamant she has been judiciously compliant with synthroid medications offered. I think she was taking the generic brand as outpt and  her dose was decreased to 88 MCG about 3 weeks ago.   She is receiving increased dose at 150 MCG of Synthroid here  Discussed with her that she will need the levelsre checked in 4 weeks  Check free T4  .  3. History of depression: Continue venlafaxine  .Also on aderall  4. History of SI- Denies any ideations or intent on this admission.   DVT prophylaxis: Lovenox Code Status: Full Family Communication: None at bedside Disposition Plan: DC back home once constipation has resolved. Barriers: Clean not stable to be discharge with high risk of returning to the hospital until constipation has resolved.  Is getting colonoscopy on Sunday. D/c date: 08/24/19       LOS: 0 days   Time spent: 45 minutes with more than 50% on [place], MD [facility] Pager [phone]  If 7PM-7AM, please contact night-coverage [email or web address] Password [email or web address] 08/22/2019, 1:45 PM

## 2019-08-23 ENCOUNTER — Inpatient Hospital Stay: Payer: Self-pay

## 2019-08-23 LAB — BASIC METABOLIC PANEL
Anion gap: 9 (ref 5–15)
BUN: <5 mg/dL — ABNORMAL LOW (ref 6–20)
CO2: 26 mmol/L (ref 22–32)
Calcium: 8.3 mg/dL — ABNORMAL LOW (ref 8.9–10.3)
Chloride: 107 mmol/L (ref 98–111)
Creatinine, Ser: 0.65 mg/dL (ref 0.44–1.00)
GFR calc Af Amer: >60 mL/min (ref >60)
GFR calc non Af Amer: >60 mL/min (ref >60)
Glucose, Bld: 82 mg/dL (ref 70–99)
Potassium: 3.6 mmol/L (ref 3.5–5.1)
Sodium: 142 mmol/L (ref 135–145)

## 2019-08-23 LAB — MAGNESIUM: Magnesium: 2 mg/dL (ref 1.7–2.4)

## 2019-08-23 MED ORDER — PEG 3350-KCL-NA BICARB-NACL 420 G PO SOLR
2000.0000 mL | Freq: Once | ORAL | Status: AC
  Administered 2019-08-23: 2000 mL via ORAL
  Filled 2019-08-23: qty 4000

## 2019-08-23 MED ORDER — DICYCLOMINE HCL 10 MG PO CAPS
10.0000 mg | ORAL_CAPSULE | Freq: Three times a day (TID) | ORAL | Status: DC | PRN
  Administered 2019-08-23: 10 mg via ORAL
  Filled 2019-08-23 (×2): qty 1

## 2019-08-23 NOTE — Anesthesia Preprocedure Evaluation (Addendum)
Anesthesia Evaluation  Patient identified by MRN, date of birth, ID band Patient awake    Reviewed: Allergy & Precautions, H&P , NPO status , reviewed documented beta blocker date and time   Airway Mallampati: II  TM Distance: >3 FB Neck ROM: full    Dental  (+) Upper Dentures, Dental Advidsory Given   Pulmonary former smoker,    Pulmonary exam normal        Cardiovascular Normal cardiovascular exam  Nml EKG   Neuro/Psych  Headaches, PSYCHIATRIC DISORDERS Anxiety Depression    GI/Hepatic neg GERD  ,  Endo/Other  Hypothyroidism   Renal/GU      Musculoskeletal   Abdominal   Peds  Hematology   Anesthesia Other Findings Past Medical History: No date: Colitis No date: Depression No date: Frequent headaches No date: Hypothyroidism No date: Migraines 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 No date: Suicidal intent  Past Surgical History: No date: TONSILLECTOMY No date: TUBAL LIGATION     Reproductive/Obstetrics                            Anesthesia Physical Anesthesia Plan  ASA: II and emergent  Anesthesia Plan: General   Post-op Pain Management:    Induction: Intravenous  PONV Risk Score and Plan: 3 and Treatment may vary due to age or medical condition and TIVA  Airway Management Planned: Nasal Cannula and Natural Airway  Additional Equipment:   Intra-op Plan:   Post-operative Plan:   Informed Consent: I have reviewed the patients History and Physical, chart, labs and discussed the procedure including the risks, benefits and alternatives for the proposed anesthesia with the patient or authorized representative who has indicated his/her understanding and acceptance.     Dental Advisory Given  Plan Discussed with: CRNA  Anesthesia Plan Comments:        Anesthesia Quick Evaluation

## 2019-08-23 NOTE — Progress Notes (Signed)
Patient was experiencing severe abdomen pain. NP notified. Toradol was prescribed and given. Xray was done. Patient resting with pain controlled. Will continue to monitor.

## 2019-08-23 NOTE — Progress Notes (Signed)
Jonathon Bellows , MD [address] [place], [place], [place], [address] [address], [place], [place], [place], [address] Phone: [phone]  Fax: [phone]   Rachel Alexander is being followed for her constipation day 1 of follow up   Subjective: Last night had severe abdominal pain that has resolved since feels fantastic today.  Was very happy and smiling in the room.  She states that this is how she would like to normally feel.   Objective: Vital signs in last 24 hours: Vitals:   08/22/19 1630 08/22/19 2257 08/22/19 2352 08/23/19 0737  BP: 109/77 115/79 (!) 123/92 106/82  Pulse: 78 70 75 78  Resp: 18 18  16   Temp: 97.8 F (36.6 C) 97.9 F (36.6 C)  98 F (36.7 C)  TempSrc: Oral Oral    SpO2: 100% 100% 100% 99%  Weight:      Height:       Weight change:   Intake/Output Summary (Last 24 hours) at 08/23/2019 1228 Last data filed at 08/23/2019 1009 Gross per 24 hour  Intake 2942.94 ml  Output --  Net 2942.94 ml     Exam: Alert and oriented x3 not in any pain or distress feels much better   Lab Results: @LABTEST2 @ Micro Results: Recent Results (from the past 240 hour(s))  SARS CORONAVIRUS 2 (TAT 6-24 HRS) Nasopharyngeal Nasopharyngeal Swab     Status: None   Collection Time: 08/22/19  1:39 AM   Specimen: Nasopharyngeal Swab  Result Value Ref Range Status   SARS Coronavirus 2 NEGATIVE NEGATIVE Final    Comment: (NOTE) SARS-CoV-2 target nucleic acids are NOT DETECTED. The SARS-CoV-2 RNA is generally detectable in upper and lower respiratory specimens during the acute phase of infection. Negative results do not preclude SARS-CoV-2 infection, do not rule out co-infections with other pathogens, and should not be used as the sole basis for treatment or other patient management decisions. Negative results must be combined with clinical observations, patient history, and epidemiological information. The expected result is Negative. Fact Sheet for  Patients: SugarRoll.be Fact Sheet for Healthcare Providers: https://www.woods-mathews.com/ This test is not yet approved or cleared by the [place] FDA and  has been authorized for detection and/or diagnosis of SARS-CoV-2 by FDA under an Emergency Use Authorization (EUA). This EUA will remain  in effect (meaning this test can be used) for the duration of the COVID-19 declaration under Section 56 4(b)(1) of the Act, 21 U.S.C. section 360bbb-3(b)(1), unless the authorization is terminated or revoked sooner. Performed at [facility], [place] [address], [place], [place] [address]    Studies/Results: DG Abd 1 View  Result Date: 08/23/2019 CLINICAL DATA:  Abdominal pain and diarrhea EXAM: ABDOMEN - 1 VIEW COMPARISON:  August 21, 2019 FINDINGS: There is nonspecific mild gaseous distention of loops of small bowel and colon scattered throughout the abdomen. The stool burden is improved from prior studies. IMPRESSION: Nonobstructive bowel gas pattern. Electronically Signed   By: Constance Holster M.D.   On: 08/23/2019 00:59   DG Abd 2 Views  Result Date: 08/22/2019 CLINICAL DATA:  Abdominal pain and constipation for several days EXAM: ABDOMEN - 2 VIEW COMPARISON:  08/20/2019 FINDINGS: Scattered large and small bowel gas is noted. The amount of fecal material within the colon has improved significantly from the previous day. No obstructive changes are noted. No free air is seen. No bony abnormality is noted. IMPRESSION: Improvement in the degree of constipation when compared with the prior exam. Electronically Signed  By: Inez Catalina M.D.   On: 08/22/2019 00:59   Medications: I have reviewed the patient's current medications. Scheduled Meds: . amphetamine-dextroamphetamine  30 mg Oral BID  . enoxaparin (LOVENOX) injection  40 mg Subcutaneous Q24H  . influenza vac split quadrivalent PF  0.5 mL Intramuscular Tomorrow-1000  . levothyroxine  150 mcg Oral  Q0600  . polyethylene glycol-electrolytes  2,000 mL Oral Once  . venlafaxine XR  37.5 mg Oral Q breakfast   Continuous Infusions: . sodium chloride    . sodium chloride 100 mL/hr at 08/23/19 1106   PRN Meds:.acetaminophen, ALPRAZolam   Assessment: Active Problems:   Ileus, unspecified ([place])  I was consulted to see her for severe constipation going on for many years.  Came in with features of partial bowel obstruction.  Significantly improved after having good bowel movements.  Commenced on GoLYTELY yesterday and had good bowel movements associated with cramping very likely due to her GI tract with regaining peristalsis.  Likely underlying problem is severe hypothyroidism.  She has felt a protrusion from her rectum ongoing on and off at times.  Hence we will proceed with evaluation with colonoscopy tomorrow.   Plan: 1.  Colonoscopy tomorrow.  I have instructed her to take another 2 L of GoLYTELY this evening 5 PM.  She probably does not need to drink the whole 2 L but only till stool is the color of apple juice that is clear and transparent.  It is her birthday tomorrow and she would like to leave after that provided her colonoscopy result is normal.  I would suggest her to commence on Trulance as an outpatient 3 mg once a day.  I have instructed the husband to pick up samples from the office on Monday morning.   LOS: 1 day   Jonathon Bellows, MD 08/23/2019, 12:28 PM

## 2019-08-23 NOTE — Progress Notes (Signed)
Pt reporting anxiety, pt offered anxiety medication but informed that next dose would not be available until after midnight. Pt opted to wait until bedtime to take ativan. Alternative coping mechanisms reviewed with patient including deep breathing, positive self reflection, visualization, and spiritual care. Pt agrees to continue trying to practice these coping techniques until she can take ativan later.

## 2019-08-23 NOTE — Progress Notes (Signed)
PROGRESS NOTE    Rachel Alexander  [id number] DOB: 12/05/1980 DOA: 08/21/2019 PCP: Burnard Hawthorne, FNP    Brief Narrative:  Rachel Alexander is a 39 y.o. female admitted to the hospital earlier today when she presented to the hospital with constipation .  Her Toprol was added to the ER over the past 2 weeks for constipation.  Has tried everything as outpatient and reports has not helped.    Consultants:   none  Procedures: CT  Antimicrobials:   None   Subjective: Finally had multiple bowel movements.  No new complaints  Objective: Vitals:   08/22/19 1630 08/22/19 2257 08/22/19 2352 08/23/19 0737  BP: 109/77 115/79 (!) 123/92 106/82  Pulse: 78 70 75 78  Resp: 18 18  16   Temp: 97.8 F (36.6 C) 97.9 F (36.6 C)  98 F (36.7 C)  TempSrc: Oral Oral    SpO2: 100% 100% 100% 99%  Weight:      Height:        Intake/Output Summary (Last 24 hours) at 08/23/2019 1223 Last data filed at 08/23/2019 1009 Gross per 24 hour  Intake 2942.94 ml  Output --  Net 2942.94 ml   Filed Weights   08/21/19 2014  Weight: 55.3 kg    Examination:  General exam: Appears calm and comfortable, NAD Respiratory system: Clear to auscultation. Respiratory effort normal. Cardiovascular system: S1 & S2 heard, RRR. No JVD, murmurs, rubs, gallops or clicks. Gastrointestinal system: Abdomen is nondistended, soft and nontender. Normal bowel sounds heard. Central nervous system: Alert and oriented. No focal neurological deficits. Extremities: No edema Skin: Warm dry Psychiatry: Judgement and insight appear normal. Mood & affect appropriate.     Data Reviewed: I have personally reviewed following labs and imaging studies  CBC: Recent Labs  Lab 08/19/19 2124 08/20/19 2253 08/21/19 1807 08/21/19 2019 08/22/19 0212  WBC 5.9 6.2 6.0 5.4 4.9  NEUTROABS  --   --  2.5 2.1  --   HGB 12.5 12.6 13.0 13.0 11.1*  HCT 38.2 38.1 40.2 39.8 34.4*  MCV 103.0* 101.6* 103.6* 102.8* 103.3*  PLT 246 258  261 257 [id number]   Basic Metabolic Panel: Recent Labs  Lab 08/19/19 2124 08/20/19 2253 08/21/19 2019 08/22/19 0212 08/23/19 1125  NA 137 137 137 139 142  K 3.6 4.0 3.9 4.0 3.6  CL 96* 102 100 106 107  CO2 29 25 29 28 26   GLUCOSE 94 92 110* 86 82  BUN 11 <5* 8 7 <5*  CREATININE 0.70 0.75 0.63 0.54 0.65  CALCIUM 9.2 9.1 9.2 7.8* 8.3*  MG  --   --   --   --  2.0   GFR: Estimated Creatinine Clearance: 74.7 mL/min (by C-G formula based on SCr of 0.65 mg/dL). Liver Function Tests: Recent Labs  Lab 08/19/19 2124 08/20/19 2253 08/21/19 2019  AST 22 23 20   ALT 18 21 18   ALKPHOS 43 42 46  BILITOT 0.3 0.5 0.4  PROT 7.5 6.9 7.5  ALBUMIN 4.7 4.3 4.7   Recent Labs  Lab 08/19/19 2124 08/20/19 2253  LIPASE 22 23   No results for input(s): AMMONIA in the last 168 hours. Coagulation Profile: No results for input(s): INR, PROTIME in the last 168 hours. Cardiac Enzymes: No results for input(s): CKTOTAL, CKMB, CKMBINDEX, TROPONINI in the last 168 hours. BNP (last 3 results) No results for input(s): PROBNP in the last 8760 hours. HbA1C: No results for input(s): HGBA1C in the last 72 hours. CBG: No results for input(s):  GLUCAP in the last 168 hours. Lipid Profile: No results for input(s): CHOL, HDL, LDLCALC, TRIG, CHOLHDL, LDLDIRECT in the last 72 hours. Thyroid Function Tests: Recent Labs    08/21/19 0830 08/21/19 1807 08/21/19 2019  TSH  --    < > 229.587*  FREET4 0.35*  --   --    < > = values in this interval not displayed.   Anemia Panel: No results for input(s): VITAMINB12, FOLATE, FERRITIN, TIBC, IRON, RETICCTPCT in the last 72 hours. Sepsis Labs: No results for input(s): PROCALCITON, LATICACIDVEN in the last 168 hours.  Recent Results (from the past 240 hour(s))  SARS CORONAVIRUS 2 (TAT 6-24 HRS) Nasopharyngeal Nasopharyngeal Swab     Status: None   Collection Time: 08/22/19  1:39 AM   Specimen: Nasopharyngeal Swab  Result Value Ref Range Status   SARS  Coronavirus 2 NEGATIVE NEGATIVE Final    Comment: (NOTE) SARS-CoV-2 target nucleic acids are NOT DETECTED. The SARS-CoV-2 RNA is generally detectable in upper and lower respiratory specimens during the acute phase of infection. Negative results do not preclude SARS-CoV-2 infection, do not rule out co-infections with other pathogens, and should not be used as the sole basis for treatment or other patient management decisions. Negative results must be combined with clinical observations, patient history, and epidemiological information. The expected result is Negative. Fact Sheet for Patients: SugarRoll.be Fact Sheet for Healthcare Providers: https://www.woods-mathews.com/ This test is not yet approved or cleared by the [place] FDA and  has been authorized for detection and/or diagnosis of SARS-CoV-2 by FDA under an Emergency Use Authorization (EUA). This EUA will remain  in effect (meaning this test can be used) for the duration of the COVID-19 declaration under Section 56 4(b)(1) of the Act, 21 U.S.C. section 360bbb-3(b)(1), unless the authorization is terminated or revoked sooner. Performed at [facility], [place] [address] [place], [place] [address]          Radiology Studies: DG Abd 1 View  Result Date: 08/23/2019 CLINICAL DATA:  Abdominal pain and diarrhea EXAM: ABDOMEN - 1 VIEW COMPARISON:  August 21, 2019 FINDINGS: There is nonspecific mild gaseous distention of loops of small bowel and colon scattered throughout the abdomen. The stool burden is improved from prior studies. IMPRESSION: Nonobstructive bowel gas pattern. Electronically Signed   By: Constance Holster M.D.   On: 08/23/2019 00:59   DG Abd 2 Views  Result Date: 08/22/2019 CLINICAL DATA:  Abdominal pain and constipation for several days EXAM: ABDOMEN - 2 VIEW COMPARISON:  08/20/2019 FINDINGS: Scattered large and small bowel gas is noted. The amount of fecal material  within the colon has improved significantly from the previous day. No obstructive changes are noted. No free air is seen. No bony abnormality is noted. IMPRESSION: Improvement in the degree of constipation when compared with the prior exam. Electronically Signed   By: Inez Catalina M.D.   On: 08/22/2019 00:59        Scheduled Meds: . amphetamine-dextroamphetamine  30 mg Oral BID  . enoxaparin (LOVENOX) injection  40 mg Subcutaneous Q24H  . influenza vac split quadrivalent PF  0.5 mL Intramuscular Tomorrow-1000  . levothyroxine  150 mcg Oral Q0600  . venlafaxine XR  37.5 mg Oral Q breakfast   Continuous Infusions: . sodium chloride    . sodium chloride 100 mL/hr at 08/23/19 1106    Assessment & Plan:   Active Problems:   Ileus, unspecified ([place])   1. Acute Constipation/Obstipation- failed outpt regimen. Given fleet  enema. Spoke to GI , recommentded Golytly. GI consulted as pt wants to talk to them in person.  Plan for cscope on Sunday. Avoid pain meds   2.. Acute on chronic Hypothyroidism: TSH levels extremely high. Patient is adamant she has been judiciously compliant with synthroid medications offered. I think she was taking the generic brand as outpt and her dose was decreased to 88 MCG about 3 weeks ago.   She is receiving increased dose at 150 MCG of Synthroid here  Discussed with her that she will need the levelsre checked in 4 weeks  Check free T4  .  3. History of depression: Continue venlafaxine  .Also on aderall  4. History of SI- Denies any ideations or intent on this admission.   DVT prophylaxis: Lovenox Code Status: Full Family Communication: None at bedside Disposition Plan: DC back home once constipation has resolved. Barriers: Clean not stable to be discharge with high risk of returning to the hospital until constipation has resolved.  Is getting colonoscopy on Sunday. D/c date: 08/24/19       LOS: 1 day   Time spent: 45 minutes with more than 50%  on [place], MD [facility] Pager [phone]  If 7PM-7AM, please contact night-coverage [email or web address] Password [place] 08/23/2019, 12:23 PM Patient ID: Rachel Alexander, female   DOB: [birth year], 39 y.o.   MRN: [id number]

## 2019-08-24 ENCOUNTER — Other Ambulatory Visit: Payer: Self-pay | Admitting: Family

## 2019-08-24 ENCOUNTER — Inpatient Hospital Stay: Payer: Self-pay | Admitting: Anesthesiology

## 2019-08-24 ENCOUNTER — Ambulatory Visit: Admit: 2019-08-24 | Payer: Self-pay | Admitting: Gastroenterology

## 2019-08-24 ENCOUNTER — Encounter
Admission: EM | Disposition: A | Discharge status: Home / Self Care | Payer: Self-pay | Source: Home / Self Care | Attending: Internal Medicine

## 2019-08-24 DIAGNOSIS — F909 Attention-deficit hyperactivity disorder, unspecified type: Secondary | ICD-10-CM

## 2019-08-24 HISTORY — PX: COLONOSCOPY: SHX5424

## 2019-08-24 SURGERY — COLONOSCOPY
Anesthesia: General

## 2019-08-24 MED ORDER — LEVOTHYROXINE SODIUM 150 MCG PO TABS: 150.0000 ug | ORAL_TABLET | Freq: Every day | ORAL | 0 refills | Status: DC

## 2019-08-24 MED ORDER — PROPOFOL 500 MG/50ML IV EMUL
INTRAVENOUS | Status: DC | PRN
  Administered 2019-08-24: 175 ug/kg/min via INTRAVENOUS

## 2019-08-24 MED ORDER — PROPOFOL 10 MG/ML IV BOLUS
INTRAVENOUS | Status: AC
  Filled 2019-08-24: qty 20

## 2019-08-24 MED ORDER — PROPOFOL 500 MG/50ML IV EMUL
INTRAVENOUS | Status: AC
  Filled 2019-08-24: qty 50

## 2019-08-24 MED ORDER — DIAZEPAM 2 MG PO TABS
2.0000 mg | ORAL_TABLET | Freq: Once | ORAL | Status: AC
  Administered 2019-08-24: 2 mg via ORAL
  Filled 2019-08-24: qty 1

## 2019-08-24 MED ORDER — PROPOFOL 10 MG/ML IV BOLUS
INTRAVENOUS | Status: DC | PRN
  Administered 2019-08-24: 50 mg via INTRAVENOUS

## 2019-08-24 MED ORDER — KETOROLAC TROMETHAMINE 30 MG/ML IJ SOLN
30.0000 mg | Freq: Once | INTRAMUSCULAR | Status: AC
  Administered 2019-08-24: 30 mg via INTRAVENOUS
  Filled 2019-08-24: qty 1

## 2019-08-24 NOTE — Transfer of Care (Signed)
Immediate Anesthesia Transfer of Care Note  Patient: Rachel Alexander  Procedure(s) Performed: COLONOSCOPY (N/A )  Patient Location: PACU  Anesthesia Type:MAC  Level of Consciousness: awake, alert  and oriented  Airway & Oxygen Therapy: Patient Spontanous Breathing and Patient connected to nasal cannula oxygen  Post-op Assessment: Report given to RN and Post -op Vital signs reviewed and stable  Post vital signs: Reviewed and stable  Last Vitals:  Vitals Value Taken Time  BP    Temp    Pulse    Resp    SpO2      Last Pain:  Vitals:   08/24/19 0742  TempSrc: Temporal  PainSc:       Patients Stated Pain Goal: 0 (72/25/75 0518)  Complications: No apparent anesthesia complications

## 2019-08-24 NOTE — Discharge Summary (Signed)
Rachel Alexander [id number] DOB: September 17, 1980 DOA: 08/21/2019  PCP: Burnard Hawthorne, FNP  Admit date: 08/21/2019 Discharge date: 08/24/2019  Admitted From: Home Disposition: Home  Recommendations for Outpatient Follow-up:  1. Follow up with PCP in 1 week 2. Please obtain BMP/CBC in one week 3. Follow-up with Dr. Jonathon Bellows in 4 weeks 4. Needs TSH checked in 4 weeks  Home Health: None   Discharge Condition:Stable CODE STATUS: Full Diet recommendation: Regular   Brief/Interim Summary: Rachel Alexander is a 39 y.o. female with anxiety disorder, depression and Hypothyroidism for several years.  She has been in the ED recently on multiple visits on account of constipation over the past 2 weeks.  CT scan from 03/03 reviewed shows evidence of stool impaction and ? Ileus. Repeat xray of the abdomen today was concerning for scattered large and small bowel gas. The amount of fecalmaterial within the colon however seems improved.Patient admits to adbominal discomfort and severe constipation.She was started on Golytely and GI was consulted as patient felt something in her rectum. She underwent colonoscopy today  Which was unremarkable .She is suppose to f/u with Dr. Vicente Males tomorrow to pickup Trulance samples at his office. She was also found with severe hypothryoidsm with very elevated TSH level and FT4 was low. Her synthroid dose was increased from 90mcg to 134mcg which apparently she was taking before the change 3 weeks ago as outpt. She is suppose to follow up with her primary care to have this checked.Her constipation has resolved. She is stable for dc today  Discharge Diagnoses:  Active Problems:   Ileus, unspecified [facility])    Discharge Instructions  Discharge Instructions    Call MD for:  persistant nausea and vomiting   Complete by: As directed    Diet - low sodium heart healthy   Complete by: As directed    Diet - low sodium heart healthy   Complete by: As directed    Discharge instructions    Complete by: As directed    Jonathon Bellows MD [address], [place], [place] [address] Phone: [phone] Fax : [phone]   YOU HAD AN ENDOSCOPIC PROCEDURE TODAY: Refer to the procedure report that was given to you for any specific questions about what was found during the examination.  If the procedure report does not answer your questions, please call your gastroenterologist to clarify.  YOU SHOULD EXPECT: Some feelings of bloating in the abdomen. Passage of more gas than usual.  Walking can help get rid of the air that was put into your GI tract during the procedure and reduce the bloating. If you had a lower endoscopy (such as a colonoscopy or flexible sigmoidoscopy) you may notice spotting of blood in your stool or on the toilet paper.   DIET: Your first meal following the procedure should be a light meal and then it is ok to progress to your normal diet.  A half-sandwich or bowl of soup is an example of a good first meal.  Heavy or fried foods are harder to digest and may make you feel nasueas or bloated.  Drink plenty of fluids but you should avoid alcoholic beverages for 24 hours.  ACTIVITY: Your care partner should take you home directly after the procedure.  You should plan to take it easy, moving slowly for the rest of the day.  You can resume normal activity the day after the procedure however you should NOT DRIVE, make legal decisions or use heavy machinery for 24 hours (because of  the sedation medicines used during the test).    SYMPTOMS TO REPORT IMMEDIATELY  A gastroenterologist can be reached at any hour.  Please call your doctor's office for any of the following symptoms:  Following lower endoscopy (colonoscopy, flexible sigmoidoscopy)  Excessive amounts of blood in the stool  Significant tenderness, worsening of abdominal pains  Swelling of the abdomen that is new, acute  Fever of 100 or higher Following upper endoscopy (EGD, EUS, ERCP)  Vomiting of blood or  coffee ground material  New, significant abdominal pain  New, significant chest pain or pain under the shoulder blades  Painful or persistently difficult swallowing  New shortness of breath  Black, tarry-looking stools  FOLLOW UP: If any biopsies were taken you will be contacted by phone or by letter within the next 1-3 weeks.  Call your gastroenterologist if you have not heard about the biopsies in 3 weeks.   Please also call your gastroenterologist's office with any specific questions about appointments or follow up tests.   Discharge instructions   Complete by: As directed    F/u pcp next week. Need repeat thyroid labs in 4 weeks F/u Dr. Dr. Jonathon Bellows office tomorrow for samples of Trulance Take miralax daily   Increase activity slowly   Complete by: As directed    Increase activity slowly   Complete by: As directed      Allergies as of 08/24/2019   No Known Allergies     Medication List    STOP taking these medications   Lactulose 20 GM/30ML Soln     TAKE these medications   ALPRAZolam 1 MG tablet Commonly known as: XANAX Take 1 tablet (1 mg total) by mouth 2 (two) times daily as needed for anxiety.   amphetamine-dextroamphetamine 30 MG tablet Commonly known as: Adderall Take 1 tablet by mouth 2 (two) times daily.   cholecalciferol 25 MCG (1000 UNIT) tablet Commonly known as: VITAMIN D3 Take 1,000 Units by mouth daily.   citalopram 20 MG tablet Commonly known as: CELEXA TAKE 1 TABLET BY MOUTH EVERY DAY   levothyroxine 150 MCG tablet Commonly known as: SYNTHROID Take 1 tablet (150 mcg total) by mouth daily at 6 (six) AM. Start taking on: August 25, 2019 What changed:   medication strength  how much to take  when to take this   venlafaxine XR 37.5 MG 24 hr capsule Commonly known as: Effexor XR Take 1 capsule (37.5 mg total) by mouth daily with breakfast.      Follow-up Information    Jonathon Bellows, MD Follow up in 4 week(s).   Specialty:  Gastroenterology Contact information: [place] [address] [phone]          No Known Allergies  Consultations:   GI  Procedures/Studies: DG Abd 1 View  Result Date: 08/23/2019 CLINICAL DATA:  Abdominal pain and diarrhea EXAM: ABDOMEN - 1 VIEW COMPARISON:  August 21, 2019 FINDINGS: There is nonspecific mild gaseous distention of loops of small bowel and colon scattered throughout the abdomen. The stool burden is improved from prior studies. IMPRESSION: Nonobstructive bowel gas pattern. Electronically Signed   By: Constance Holster M.D.   On: 08/23/2019 00:59   DG Abdomen 1 View  Result Date: 08/20/2019 CLINICAL DATA:  Abdominal pain and constipation EXAM: ABDOMEN - 1 VIEW COMPARISON:  August 15, 2019 FINDINGS: The bowel gas pattern is normal. Mildly prominent air-filled loop of probable colon seen within the mid abdomen. No radio-opaque calculi or other  significant radiographic abnormality are seen. IMPRESSION: Nonobstructive bowel gas pattern. Electronically Signed   By: Prudencio Pair M.D.   On: 08/20/2019 00:03   DG Abdomen 1 View  Result Date: 08/15/2019 CLINICAL DATA:  Constipation EXAM: ABDOMEN - 1 VIEW COMPARISON:  Nov 11, 2014 FINDINGS: There is an above average amount of stool throughout the colon. The bowel gas pattern is nonobstructive. There is some gaseous distention of the transverse colon. No free air or pneumatosis identified on this exam. IMPRESSION: Above average amount of stool throughout the colon. No evidence of bowel obstruction. Electronically Signed   By: Constance Holster M.D.   On: 08/15/2019 17:11   MR Brain W Wo Contrast  Result Date: 08/03/2019 CLINICAL DATA:  Fatigue, unspecified type. Headache; history of pituitary tumor; pituitary adenoma, known or suspected. Additional history provided: Patient with history of pituitary tumor, headaches, blurry vision and nausea, patient reports elevated TSH and worsening fatigue and  headaches. EXAM: MRI HEAD WITHOUT AND WITH CONTRAST TECHNIQUE: Multiplanar, multiecho pulse sequences of the brain and surrounding structures were obtained without and with intravenous contrast. CONTRAST:  39mL GADAVIST GADOBUTROL 1 MMOL/ML IV SOLN COMPARISON:  Head CT 03/26/2016, prior brain MRI examinations 12/25/2008 and earlier FINDINGS: Brain: Moderate motion degradation of the axial T2 FLAIR sequence. The pituitary gland is within normal limits for size. No focal signal abnormality or focus of hypoenhancement is demonstrated within the pituitary gland to suggest focal pituitary lesion. The pituitary stalk is midline and is not abnormally thickened. The suprasellar cistern is patent. No mass effect upon the optic chiasm. There is no evidence of acute infarct. No evidence of intracranial mass. No midline shift or extra-axial fluid collection. No chronic intracranial blood products. No significant white matter disease. Cerebral volume is normal for age. No abnormal intracranial enhancement. Vascular: Flow voids maintained within the proximal large arterial vessels. Expected enhancement within the proximal large arterial vessels and dural venous sinuses. Skull and upper cervical spine: Redemonstrated small focus of signal abnormality within the body of the clivus which is unchanged from prior examinations and may reflect an area of incomplete fusion of the synchondrosis. Otherwise, no focal marrow lesion. Sinuses/Orbits: Visualized orbits demonstrate no acute abnormality. Trace ethmoid sinus mucosal thickening. No significant mastoid effusion. IMPRESSION: No evidence of focal pituitary lesion. Normal MRI appearance of the brain. No evidence of acute intracranial abnormality. Electronically Signed   By: Kellie Simmering DO   On: 08/03/2019 16:20   CT ABDOMEN PELVIS W CONTRAST  Result Date: 08/20/2019 CLINICAL DATA:  Abdominal pain EXAM: CT ABDOMEN AND PELVIS WITH CONTRAST TECHNIQUE: Multidetector CT imaging of the  abdomen and pelvis was performed using the standard protocol following bolus administration of intravenous contrast. CONTRAST:  122mL OMNIPAQUE IOHEXOL 300 MG/ML  SOLN COMPARISON:  None. FINDINGS: Lower chest: The visualized heart size within normal limits. No pericardial fluid/thickening. No hiatal hernia. The visualized portions of the lungs are clear. Hepatobiliary: The liver is normal in density without focal abnormality.The main portal vein is patent. No evidence of calcified gallstones, gallbladder wall thickening or biliary dilatation. Pancreas: Unremarkable. No pancreatic ductal dilatation or surrounding inflammatory changes. Spleen: Normal in size without focal abnormality. Adrenals/Urinary Tract: Both adrenal glands appear normal. The kidneys and collecting system appear normal without evidence of urinary tract calculus or hydronephrosis. Bladder is unremarkable. Stomach/Bowel: The stomach and small bowel are normal in appearance. There is a large amount of colonic stool seen throughout. Mildly prominent stool-filled loops of transverse colon are seen measuring up to  5.9 cm. There is air and stool seen in a mildly dilated rectum. No focal fat stranding changes or bowel wall thickening are seen. The appendix is unremarkable. Vascular/Lymphatic: There are no enlarged mesenteric, retroperitoneal, or pelvic lymph nodes. No significant vascular findings are present. Reproductive: The uterus and adnexa are unremarkable. Other: No evidence of abdominal wall mass or hernia. Musculoskeletal: No acute or significant osseous findings. IMPRESSION: Large amount of colonic stool seen throughout with mildly prominent loops of transverse colon and rectum which could be due to constipation and ileus. Electronically Signed   By: Prudencio Pair M.D.   On: 08/20/2019 02:03   DG Abd 2 Views  Result Date: 08/22/2019 CLINICAL DATA:  Abdominal pain and constipation for several days EXAM: ABDOMEN - 2 VIEW COMPARISON:  08/20/2019  FINDINGS: Scattered large and small bowel gas is noted. The amount of fecal material within the colon has improved significantly from the previous day. No obstructive changes are noted. No free air is seen. No bony abnormality is noted. IMPRESSION: Improvement in the degree of constipation when compared with the prior exam. Electronically Signed   By: Inez Catalina M.D.   On: 08/22/2019 00:59       Subjective: No complaints, wants to go home  Discharge Exam: Vitals:   08/24/19 0906 08/24/19 0912  BP: 99/74 98/71  Pulse: (!) 57 (!) 54  Resp: 18 18  Temp:    SpO2: 100% 100%   Vitals:   08/24/19 0847 08/24/19 0857 08/24/19 0906 08/24/19 0912  BP: (!) 84/59 94/74 99/74  98/71  Pulse: 76 71 (!) 57 (!) 54  Resp: 11 12 18 18   Temp: (!) 96.6 F (35.9 C) (!) 96.8 F (36 C)    TempSrc:      SpO2: 100% 100% 100% 100%  Weight:      Height:        General: Pt is alert, awake, not in acute distress Cardiovascular: RRR, S1/S2 +, no rubs, no gallops Respiratory: CTA bilaterally, no wheezing, no rhonchi Abdominal: Soft, NT, ND, bowel sounds + Extremities: no edema, no cyanosis    The results of significant diagnostics from this hospitalization (including imaging, microbiology, ancillary and laboratory) are listed below for reference.     Microbiology: Recent Results (from the past 240 hour(s))  SARS CORONAVIRUS 2 (TAT 6-24 HRS) Nasopharyngeal Nasopharyngeal Swab     Status: None   Collection Time: 08/22/19  1:39 AM   Specimen: Nasopharyngeal Swab  Result Value Ref Range Status   SARS Coronavirus 2 NEGATIVE NEGATIVE Final    Comment: (NOTE) SARS-CoV-2 target nucleic acids are NOT DETECTED. The SARS-CoV-2 RNA is generally detectable in upper and lower respiratory specimens during the acute phase of infection. Negative results do not preclude SARS-CoV-2 infection, do not rule out co-infections with other pathogens, and should not be used as the sole basis for treatment or other  patient management decisions. Negative results must be combined with clinical observations, patient history, and epidemiological information. The expected result is Negative. Fact Sheet for Patients: SugarRoll.be Fact Sheet for Healthcare Providers: https://www.woods-mathews.com/ This test is not yet approved or cleared by the [place] FDA and  has been authorized for detection and/or diagnosis of SARS-CoV-2 by FDA under an Emergency Use Authorization (EUA). This EUA will remain  in effect (meaning this test can be used) for the duration of the COVID-19 declaration under Section 56 4(b)(1) of the Act, 21 U.S.C. section 360bbb-3(b)(1), unless the authorization is terminated or revoked sooner. Performed at [facility], [place] [address], [place], [place] [address]      Labs: BNP (last 3 results) No results for input(s): BNP in the last 8760 hours. Basic Metabolic Panel: Recent Labs  Lab 08/19/19 2124 08/20/19 2253 08/21/19 2019 08/22/19 0212 08/23/19 1125  NA 137 137 137 139 142  K 3.6 4.0 3.9 4.0 3.6  CL 96* 102 100 106 107  CO2 29 25 29 28 26   GLUCOSE 94 92 110* 86 82  BUN 11 <5* 8 7 <5*  CREATININE 0.70 0.75 0.63 0.54 0.65  CALCIUM 9.2 9.1 9.2 7.8* 8.3*  MG  --   --   --   --  2.0   Liver Function Tests: Recent Labs  Lab 08/19/19 2124 08/20/19 2253 08/21/19 2019  AST 22 23 20   ALT 18 21 18   ALKPHOS 43 42 46  BILITOT 0.3 0.5 0.4  PROT 7.5 6.9 7.5  ALBUMIN 4.7 4.3 4.7   Recent Labs  Lab 08/19/19 2124 08/20/19 2253  LIPASE 22 23   No results for input(s): AMMONIA in the last 168 hours. CBC: Recent Labs  Lab 08/19/19 2124 08/20/19 2253 08/21/19 1807 08/21/19 2019 08/22/19 0212  WBC 5.9 6.2 6.0 5.4 4.9  NEUTROABS  --   --  2.5 2.1  --   HGB 12.5 12.6 13.0 13.0 11.1*  HCT 38.2 38.1 40.2 39.8 34.4*  MCV 103.0* 101.6* 103.6* 102.8* 103.3*  PLT 246 258 261 257 221   Cardiac Enzymes: No results for  input(s): CKTOTAL, CKMB, CKMBINDEX, TROPONINI in the last 168 hours. BNP: Invalid input(s): POCBNP CBG: No results for input(s): GLUCAP in the last 168 hours. D-Dimer No results for input(s): DDIMER in the last 72 hours. Hgb A1c No results for input(s): HGBA1C in the last 72 hours. Lipid Profile No results for input(s): CHOL, HDL, LDLCALC, TRIG, CHOLHDL, LDLDIRECT in the last 72 hours. Thyroid function studies Recent Labs    08/21/19 2019  TSH 229.587*   Anemia work up No results for input(s): VITAMINB12, FOLATE, FERRITIN, TIBC, IRON, RETICCTPCT in the last 72 hours. Urinalysis    Component Value Date/Time   COLORURINE STRAW (A) 08/20/2019 2227   APPEARANCEUR CLEAR 08/20/2019 2227   LABSPEC 1.009 08/20/2019 2227   PHURINE 5.0 08/20/2019 2227   GLUCOSEU NEGATIVE 08/20/2019 2227   GLUCOSEU NEGATIVE 04/17/2019 1639   HGBUR NEGATIVE 08/20/2019 2227   BILIRUBINUR NEGATIVE 08/20/2019 2227   KETONESUR NEGATIVE 08/20/2019 2227   PROTEINUR NEGATIVE 08/20/2019 2227   UROBILINOGEN 0.2 04/17/2019 1639   NITRITE NEGATIVE 08/20/2019 2227   LEUKOCYTESUR NEGATIVE 08/20/2019 2227   Sepsis Labs Invalid input(s): PROCALCITONIN,  WBC,  LACTICIDVEN Microbiology Recent Results (from the past 240 hour(s))  SARS CORONAVIRUS 2 (TAT 6-24 HRS) Nasopharyngeal Nasopharyngeal Swab     Status: None   Collection Time: 08/22/19  1:39 AM   Specimen: Nasopharyngeal Swab  Result Value Ref Range Status   SARS Coronavirus 2 NEGATIVE NEGATIVE Final    Comment: (NOTE) SARS-CoV-2 target nucleic acids are NOT DETECTED. The SARS-CoV-2 RNA is generally detectable in upper and lower respiratory specimens during the acute phase of infection. Negative results do not preclude SARS-CoV-2 infection, do not rule out co-infections with other pathogens, and should not be used as the sole basis for treatment or other patient management decisions. Negative results must be combined with clinical observations, patient  history, and epidemiological information. The expected result is Negative. Fact Sheet for Patients: SugarRoll.be Fact Sheet for Healthcare Providers: https://www.woods-mathews.com/ This test is  not yet approved or cleared by the [place] and  has been authorized for detection and/or diagnosis of SARS-CoV-2 by FDA under an Emergency Use Authorization (EUA). This EUA will remain  in effect (meaning this test can be used) for the duration of the COVID-19 declaration under Section 56 4(b)(1) of the Act, 21 U.S.C. section 360bbb-3(b)(1), unless the authorization is terminated or revoked sooner. Performed at [facility], [place] [address] [place], [place] [address]      Time coordinating discharge: Over 30 minutes  SIGNED:   Nolberto Hanlon, MD  [facility] 08/24/2019, 11:37 AM Pager   If 7PM-7AM, please contact night-coverage [email or web address] Password [email or web address]

## 2019-08-24 NOTE — H&P (Signed)
Jonathon Bellows, MD [address], [place], [place], [place], [address] [address], [place], [place], [place], [address] Phone: [phone]  Fax: [phone]  Primary Care Physician:  Burnard Hawthorne, FNP   Pre-Procedure History & Physical: HPI:  Rachel Alexander is a 39 y.o. female is here for an colonoscopy.   Past Medical History:  Diagnosis Date  . Colitis   . Depression   . Frequent headaches   . Hypothyroidism   . Migraines   . Pituitary tumor   . Suicidal intent     Past Surgical History:  Procedure Laterality Date  . TONSILLECTOMY    . TUBAL LIGATION      Prior to Admission medications   Medication Sig Start Date End Date Taking? Authorizing Provider  amphetamine-dextroamphetamine (ADDERALL) 30 MG tablet Take 1 tablet by mouth 2 (two) times daily. 07/29/19  Yes Burnard Hawthorne, FNP  cholecalciferol (VITAMIN D3) 25 MCG (1000 UNIT) tablet Take 1,000 Units by mouth daily.   Yes [provider]  levothyroxine (SYNTHROID) 88 MCG tablet Take 1 tablet (88 mcg total) by mouth daily before breakfast. 07/29/19  Yes Burnard Hawthorne, FNP  venlafaxine XR (EFFEXOR XR) 37.5 MG 24 hr capsule Take 1 capsule (37.5 mg total) by mouth daily with breakfast. 07/28/19  Yes Arnett, Yvetta Coder, FNP  ALPRAZolam Duanne Moron) 1 MG tablet Take 1 tablet (1 mg total) by mouth 2 (two) times daily as needed for anxiety. 08/22/19   Burnard Hawthorne, FNP  citalopram (CELEXA) 20 MG tablet TAKE 1 TABLET BY MOUTH EVERY DAY Patient not taking: No sig reported 08/20/19   Burnard Hawthorne, FNP  Lactulose 20 GM/30ML SOLN Take 30 ml every 4 hours until constipation relieved.Call Doctor is no relief in 24 hours. Patient not taking: Reported on 08/22/2019 08/20/19   Burnard Hawthorne, FNP  dicyclomine (BENTYL) 20 MG tablet Take 1 tablet (20 mg total) by mouth 2 (two) times daily. 08/06/14 09/25/14  Antonietta Breach, PA-C    Allergies as of 08/21/2019  . (No Known Allergies)    Family History  Problem  Relation Age of Onset  . Diabetes Sister     Social History   Socioeconomic History  . Marital status: Married    Spouse name: Not on file  . Number of children: Not on file  . Years of education: Not on file  . Highest education level: Not on file  Occupational History  . Not on file  Tobacco Use  . Smoking status: Former Smoker    Packs/day: 1.00    Types: Cigarettes  . Smokeless tobacco: Never Used  Substance and Sexual Activity  . Alcohol use: No  . Drug use: No  . Sexual activity: Yes    Birth control/protection: None, Surgical  Other Topics Concern  . Not on file  Social History Narrative   Former marriage - made her feel worthless. Has one child   Remarried now.    Social Determinants of Health   Financial Resource Strain:   . Difficulty of Paying Living Expenses: Not on file  Food Insecurity:   . Worried About Charity fundraiser in the Last Year: Not on file  . Ran Out of Food in the Last Year: Not on file  Transportation Needs:   . Lack of Transportation (Medical): Not on file  . Lack of Transportation (Non-Medical): Not on file  Physical Activity:   . Days of Exercise per Week: Not on file  .  Minutes of Exercise per Session: Not on file  Stress:   . Feeling of Stress : Not on file  Social Connections:   . Frequency of Communication with Friends and Family: Not on file  . Frequency of Social Gatherings with Friends and Family: Not on file  . Attends Religious Services: Not on file  . Active Member of Clubs or Organizations: Not on file  . Attends Archivist Meetings: Not on file  . Marital Status: Not on file  Intimate Partner Violence:   . Fear of Current or Ex-Partner: Not on file  . Emotionally Abused: Not on file  . Physically Abused: Not on file  . Sexually Abused: Not on file    Review of Systems: See HPI, otherwise negative ROS  Physical Exam: BP 102/80   Pulse 61   Temp (!) 96.8 F (36 C) (Temporal)   Resp 16   Ht 5\' 2"   (1.575 m)   Wt 55.3 kg   LMP 08/15/2019 (Approximate) Comment: neg. preg teat  SpO2 98%   BMI 22.31 kg/m  General:   Alert,  pleasant and cooperative in NAD Head:  Normocephalic and atraumatic. Neck:  Supple; no masses or thyromegaly. Lungs:  Clear throughout to auscultation, normal respiratory effort.    Heart:  +S1, +S2, Regular rate and rhythm, No edema. Abdomen:  Soft, nontender and nondistended. Normal bowel sounds, without guarding, and without rebound.   Neurologic:  Alert and  oriented x4;  grossly normal neurologically.  Impression/Plan: Rachel Alexander is here for an colonoscopy to be performed for severe constipationRisks, benefits, limitations, and alternatives regarding  colonoscopy have been reviewed with the patient.  Questions have been answered.  All parties agreeable.   Jonathon Bellows, MD  08/24/2019, 8:24 AM

## 2019-08-24 NOTE — Progress Notes (Addendum)
Contacted the physician to complete discharge for the patient as patient was very insistent upon leaving stating firmly that she would remove her PIV herself. Pt polite but firm about intentions. Dr Kurtis Bushman, contacted who completed discharge reconciliation and gave the go ahead to complete patient DC.  Pt discharged in care of self and spouse. Declined wheelchair. PIV removed. AVS provided. Follow Up appointments and medication changes reviewed. Colonoscopy aftercare reviewed.

## 2019-08-25 ENCOUNTER — Telehealth (INDEPENDENT_AMBULATORY_CARE_PROVIDER_SITE_OTHER): Payer: Self-pay | Admitting: Family

## 2019-08-25 ENCOUNTER — Encounter: Payer: Self-pay | Admitting: *Deleted

## 2019-08-25 ENCOUNTER — Encounter: Payer: Self-pay | Admitting: Pharmacist

## 2019-08-25 ENCOUNTER — Telehealth: Payer: Self-pay | Admitting: Pharmacist

## 2019-08-25 ENCOUNTER — Ambulatory Visit: Payer: Self-pay | Admitting: Pharmacist

## 2019-08-25 VITALS — Ht 61.0 in | Wt 121.0 lb

## 2019-08-25 DIAGNOSIS — F32A Depression, unspecified: Secondary | ICD-10-CM

## 2019-08-25 DIAGNOSIS — F419 Anxiety disorder, unspecified: Secondary | ICD-10-CM

## 2019-08-25 DIAGNOSIS — G8929 Other chronic pain: Secondary | ICD-10-CM

## 2019-08-25 DIAGNOSIS — F329 Major depressive disorder, single episode, unspecified: Secondary | ICD-10-CM

## 2019-08-25 DIAGNOSIS — E039 Hypothyroidism, unspecified: Secondary | ICD-10-CM

## 2019-08-25 DIAGNOSIS — R5383 Other fatigue: Secondary | ICD-10-CM

## 2019-08-25 DIAGNOSIS — R519 Headache, unspecified: Secondary | ICD-10-CM

## 2019-08-25 DIAGNOSIS — F909 Attention-deficit hyperactivity disorder, unspecified type: Secondary | ICD-10-CM

## 2019-08-25 DIAGNOSIS — K567 Ileus, unspecified: Secondary | ICD-10-CM

## 2019-08-25 MED ORDER — LEVOTHYROXINE SODIUM 150 MCG PO TABS: 150.0000 ug | ORAL_TABLET | Freq: Every day | ORAL | 1 refills | Status: DC

## 2019-08-25 MED ORDER — TRAZODONE HCL 50 MG PO TABS: 25.0000 mg | ORAL_TABLET | Freq: Every evening | ORAL | 3 refills | Status: DC | PRN

## 2019-08-25 MED ORDER — VENLAFAXINE HCL ER 37.5 MG PO CP24: 37.5000 mg | ORAL_CAPSULE | Freq: Every day | ORAL | 1 refills | Status: DC

## 2019-08-25 NOTE — Assessment & Plan Note (Signed)
Improving.  Suspect severe hypothyroidism contributory.

## 2019-08-25 NOTE — Patient Instructions (Addendum)
Visit Information  Goals Addressed            This Visit's Progress     Patient Stated   . PharmD "I can't afford my medications" (pt-stated)       CARE PLAN ENTRY (see longtitudinal plan of care for additional care plan information)  Current Barriers:  . Financial Barriers in complicated patient with multiple medical conditions including hypothyroidism and ADHD; patient has no insurance and reports copay for Synthroid is cost prohibitive. Reports paying $80/month for medications o Hypothyroidism: Synthroid 150 mg daily - requires brand o ADHD: Adderall 30 mg BID o Depression/anxiety: Alprazolam 1 mg BID PRN, trzodone 25-50 mg QPM PRN sleep, venlafaxine 37.5 mg QAM  Pharmacist Clinical Goal(s):  Marland Kitchen Over the next 30 days, patient will work with PharmD and providers to relieve medication access concerns  Interventions: . Provided information for [facility]. PCP sending prescriptions for non-controlled substances to the pharmacy. Will collaborated w/ Langley Adie, PharmD to outreach patient to discuss eligibility . Sent MyChart message to patient with my contact information for questions or concerns.   Patient Self Care Activities:  . Patient will provide necessary portions of application to [facility]  Initial goal documentation        Patient verbalizes understanding of instructions provided today.    Plan: - Will outreach patient in 2-3 weeks to ensure connection w/ [place], PharmD, [place], CPP Clinical Pharmacist [place] [phone]

## 2019-08-25 NOTE — Progress Notes (Signed)
Virtual Visit via Video Note  I connected with@  on 08/25/19 at 10:30 AM EST by a video enabled telemedicine application and verified that I am speaking with the correct person using two identifiers.  Location patient: home Location provider:work  Persons participating in the virtual visit: patient, provider  I discussed the limitations of evaluation and management by telemedicine and the availability of in person appointments. The patient expressed understanding and agreed to proceed.   HPI: Follow-up hospitalization, hypothyroidism, and constipation Feels 'like a new person'. Had an incredible nurse whom was very supportive and feels like she is 39 now and starting a new chapter. 'best I have ever felt. '  Hasnt eaten much, ate eggplant and cheese, as was a bit nervous too. Appetite is returning.   Constipation resolved. No abdominal pain, bloating. Felt like got a lot of education in regards to bowel habits. Plans to change diet, less greasy, more fiber. Plans to increase water.   Notes that feels something some drop out of vagina.  Colonoscopy yesterday, Dr. Vicente Males. F/u in 4 weeks.Plans to pick up trulance.  Suggest vaginal ultrasound to rule out anything impinging on rectum.   Admitted to the hospital for suspected ileus 08/21/18/2021 and discharged on 08/24/2019. She was started on GoLYTELY. Synthroid dose was increased from 88 MCG to 150 MCG Ca 8.3.  Hemoglobin 11.1 TSH 229 Ab Xray 08/23/2019 -nonobstructive bowel gas pattern  Anxiety and depression-On effexor.'feels like working'. Energy is low,  Improved. Struggles with forgiving herself. counseling in the past; taking xanax most days twice per day. Helps with sleep and anxiety. Would like medication so she can take less xanax at bedtime. Headaches improved. Doesn't wake up with HA's. Weaning off of headache. May use tylenol prn. No Si/ hi.   Declines neurology for osa evaluation.   Hypothyroidism- feels better on increased  synthroid. Thinks had been on generic medication.    MR Brain due to fatigue, HA, h/o pituitary tumor. MR brain 08/03/19 showed no evidence of focal pituitary lesion, normal MRI appearance of the brain  Medications expensive, particularly brand name synthroid.   ROS: See pertinent positives and negatives per HPI.  Past Medical History:  Diagnosis Date  . Colitis   . Depression   . Frequent headaches   . Hypothyroidism   . Migraines   . Pituitary tumor   . Suicidal intent     Past Surgical History:  Procedure Laterality Date  . COLONOSCOPY N/A 08/24/2019   Procedure: COLONOSCOPY;  Surgeon: Jonathon Bellows, MD;  Location: [facility] ENDOSCOPY;  Service: Gastroenterology;  Laterality: N/A;  . TONSILLECTOMY    . TUBAL LIGATION      Family History  Problem Relation Age of Onset  . Diabetes Sister     SOCIAL HX: vapes   Current Outpatient Medications:  .  ALPRAZolam (XANAX) 1 MG tablet, Take 1 tablet (1 mg total) by mouth 2 (two) times daily as needed for anxiety., Disp: 60 tablet, Rfl: 1 .  amphetamine-dextroamphetamine (ADDERALL) 30 MG tablet, Take 1 tablet by mouth 2 (two) times daily., Disp: 60 tablet, Rfl: 0 .  cholecalciferol (VITAMIN D3) 25 MCG (1000 UNIT) tablet, Take 1,000 Units by mouth daily., Disp: , Rfl:  .  levothyroxine (SYNTHROID) 150 MCG tablet, Take 1 tablet (150 mcg total) by mouth daily at 6 (six) AM., Disp: 30 tablet, Rfl: 0 .  venlafaxine XR (EFFEXOR XR) 37.5 MG 24 hr capsule, Take 1 capsule (37.5 mg total) by mouth daily with breakfast., Disp: 90 capsule,  Rfl: 0 .  traZODone (DESYREL) 50 MG tablet, Take 0.5-1 tablets (25-50 mg total) by mouth at bedtime as needed for sleep., Disp: 30 tablet, Rfl: 3  EXAM:  VITALS per patient if applicable:  GENERAL: alert, oriented, appears well and in no acute distress  HEENT: atraumatic, conjunttiva clear, no obvious abnormalities on inspection of external nose and ears  NECK: normal movements of the head and neck  LUNGS: on  inspection no signs of respiratory distress, breathing rate appears normal, no obvious gross SOB, gasping or wheezing  CV: no obvious cyanosis  MS: moves all visible extremities without noticeable abnormality  PSYCH/NEURO: pleasant and cooperative, no obvious depression or anxiety, speech and thought processing grossly intact  ASSESSMENT AND PLAN:  Discussed the following assessment and plan:  Anxiety and depression - Plan: Ambulatory referral to Psychology, traZODone (DESYREL) 50 MG tablet  Ileus, unspecified ([place]) - Plan: US PELVIC COMPLETE WITH TRANSVAGINAL, US PELVIS TRANSVAGINAL NON-OB (TV ONLY)  Attention deficit hyperactivity disorder (ADHD), unspecified ADHD type  Chronic nonintractable headache, unspecified headache type  Hypothyroidism, unspecified type - Plan: CBC with Differential/Platelet, IBC + Ferritin, TSH, Ambulatory referral to Endocrinology  Fatigue, unspecified type Problem List Items Addressed This Visit      Digestive   Ileus, unspecified ([place])    Resolved. Reviewed hospitalization. Plans to start trulance.  Pending [place] vaginal, particularly based on patient's symptoms of urinary incontinence, constipation and mass that she is appreciated.  Pending CPE, with pelvic exam.  She will follow up with Dr Vicente Males in one month, repeat labs in one month as anemia ( new) noted.       Relevant Orders   US PELVIC COMPLETE WITH TRANSVAGINAL   US PELVIS TRANSVAGINAL NON-OB (TV ONLY)     Endocrine   Hypothyroidism    Denies any improving.  We will plan to check TSH since on new increased Synthroid ( brand) dose 150 MCG in 4 weeks.  Will also speak with pharmacist in regards making this more affordable.       Relevant Orders   CBC with Differential/Platelet   IBC + Ferritin   TSH   Ambulatory referral to Endocrinology     Other   Anxiety and depression - Primary    Pleased to see improvement on Effexor.  Continue to discuss using Xanax less frequently.  She would  like to use less especially at bedtime.  We will start trazodone low-dose for this.  Referral to counseling as well. close follow-up      Relevant Medications   traZODone (DESYREL) 50 MG tablet   Other Relevant Orders   Ambulatory referral to Psychology   Attention deficit hyperactivity disorder (ADHD)   Fatigue    Improving.  Suspect severe hypothyroidism contributory.      Headache    Improved on effexor. Declines neurology consult at this time.       Relevant Medications   traZODone (DESYREL) 50 MG tablet     -we discussed possible serious and likely etiologies, options for evaluation and workup, limitations of telemedicine visit vs in person visit, treatment, treatment risks and precautions. Pt prefers to treat via telemedicine empirically rather then risking or undertaking an in person visit at this moment. Patient agrees to seek prompt in person care if worsening, new symptoms arise, or if is not improving with treatment.   I discussed the assessment and treatment plan with the patient. The patient was provided an opportunity to ask questions and all were answered. The patient agreed  with the plan and demonstrated an understanding of the instructions.   The patient was advised to call back or seek an in-person evaluation if the symptoms worsen or if the condition fails to improve as anticipated.   Mable Paris, FNP

## 2019-08-25 NOTE — Assessment & Plan Note (Signed)
Pleased to see improvement on Effexor.  Continue to discuss using Xanax less frequently.  She would like to use less especially at bedtime.  We will start trazodone low-dose for this.  Referral to counseling as well. close follow-up

## 2019-08-25 NOTE — Telephone Encounter (Signed)
I called patient to let her know that meds were sent to medication management pharmacy. I let her know that Langley Adie should be reaching out to her. Patient ws so thankful & grateful for this!

## 2019-08-25 NOTE — Op Note (Signed)
[facility] Gastroenterology Patient Name: Ainzley Pernell Procedure Date: 08/24/2019 8:05 AM MRN: [id number] Account #: [id number] Date of Birth: 1980/12/23 Admit Type: Inpatient Age: 39 Room: [facility] ENDO ROOM 1 Gender: Female Note Status: Finalized Procedure:             Colonoscopy Indications:           Slow transit constipation Providers:             Jonathon Bellows MD, MD Medicines:             Monitored Anesthesia Care Complications:         No immediate complications. Procedure:             Pre-Anesthesia Assessment:                        - Prior to the procedure, a History and Physical was                         performed, and patient medications, allergies and                         sensitivities were reviewed. The patient's tolerance                         of previous anesthesia was reviewed.                        - The risks and benefits of the procedure and the                         sedation options and risks were discussed with the                         patient. All questions were answered and informed                         consent was obtained.                        - ASA Grade Assessment: II - A patient with mild                         systemic disease.                        After obtaining informed consent, the colonoscope was                         passed under direct vision. Throughout the procedure,                         the patient's blood pressure, pulse, and oxygen                         saturations were monitored continuously. The                         Colonoscope was introduced through the anus and  advanced to the the cecum, identified by the                         appendiceal orifice. The colonoscopy was performed                         with ease. The patient tolerated the procedure well.                         The quality of the bowel preparation was fair. Findings:      The entire examined colon  appeared normal on direct and retroflexion       views.      The digital rectal exam findings include fullness anteriorly , not       within the rectumn rather pushging into the rectum from the abdomen. Impression:            - Preparation of the colon was fair.                        - The entire examined colon is normal on direct and                         retroflexion views.                        - Abnormal digital rectal exam.                        - No specimens collected. Recommendation:        - Discharge patient to home.                        - Advance diet as tolerated.                        - She can pick up samples of Trulance from my office                         tomorrow.                        F/u video visit with me in 4 weeks                        Suggest trans vaginal ultrasound to rule out any                         lesions that could have been impinging on the recum                         which was felt during DRE Procedure Code(s):     --- Professional ---                        [phone], Colonoscopy, flexible; diagnostic, including                         collection of specimen(s) by brushing or washing, when  performed (separate procedure) Diagnosis Code(s):     --- Professional ---                        K62.89, Other specified diseases of anus and rectum                        K59.01, Slow transit constipation CPT copyright 2019 American Medical Association. All rights reserved. The codes documented in this report are preliminary and upon coder review may  be revised to meet current compliance requirements. Jonathon Bellows, MD Jonathon Bellows MD, MD 08/24/2019 8:44:04 AM This report has been signed electronically. Number of Addenda: 0 Note Initiated On: 08/24/2019 8:05 AM Scope Withdrawal Time: 0 hours 8 minutes 8 seconds  Total Procedure Duration: 0 hours 12 minutes 37 seconds  Estimated Blood Loss:  Estimated blood loss: none.      [facility]

## 2019-08-25 NOTE — Telephone Encounter (Addendum)
Sarah,   Can you call the patient and explain the following:   [facility] at [facility] is a pharmacy that can help patients without insurance who see providers in [place]. They can help apply for patient assistance for expensive medications, such as brand name Synthroid. They may also be able to help supply your other medications (except Adderall and alprazolam, as they do not supply controlled substances) for free. One of their pharmacists, Langley Adie, will call to discuss financial eligibility.   Medication Management Address:  [address], [place], [place] [address]    Phone: [phone]  Joycelyn Schmid - please send Synthroid, venlafaxine, trazodone to [facility].

## 2019-08-25 NOTE — Chronic Care Management (AMB) (Signed)
Chronic Care Management   Note  08/25/2019 Name: Rachel Alexander MRN: [id number] DOB: [birth year]   Subjective:  Rachel Alexander is a 39 y.o. year old female who is a primary care patient of Burnard Hawthorne, FNP. The CCM team was consulted for assistance with chronic disease management and care coordination needs.    Care coordination completed today.   Review of patient status, including review of consultants reports, laboratory and other test data, was performed as part of comprehensive evaluation and provision of chronic care management services.   SDOH (Social Determinants of Health) assessments and interventions performed:  SDOH Interventions     Most Recent Value  SDOH Interventions  SDOH Interventions for the Following Domains  Financial Strain  Financial Strain Interventions  Other (Comment) [Medication Management referral]       Objective:  Lab Results  Component Value Date   CREATININE 0.65 08/23/2019   CREATININE 0.54 08/22/2019   CREATININE 0.63 08/21/2019    Lab Results  Component Value Date   HGBA1C 5.6 07/28/2019    BP Readings from Last 3 Encounters:  08/24/19 98/71  08/20/19 98/72  08/20/19 104/78    No Known Allergies  Medications Reviewed Today    Reviewed by Burnard Hawthorne, FNP (Family Nurse Practitioner) on 08/25/19 at 1110  Med List Status: <None>  Medication Order Taking? Sig Documenting Provider Last Dose Status Informant  ALPRAZolam (XANAX) 1 MG tablet [id number] Yes Take 1 tablet (1 mg total) by mouth 2 (two) times daily as needed for anxiety. Burnard Hawthorne, FNP Taking Active   amphetamine-dextroamphetamine (ADDERALL) 30 MG tablet [id number] Yes Take 1 tablet by mouth 2 (two) times daily. Burnard Hawthorne, FNP Taking Active Self  cholecalciferol (VITAMIN D3) 25 MCG (1000 UNIT) tablet [id number] Yes Take 1,000 Units by mouth daily. [provider] Taking Active Self        Discontinued 09/25/14 2240   levothyroxine  (SYNTHROID) 150 MCG tablet [id number] Yes Take 1 tablet (150 mcg total) by mouth daily at 6 (six) AM. Nolberto Hanlon, MD Taking Active   venlafaxine XR (EFFEXOR XR) 37.5 MG 24 hr capsule [id number] Yes Take 1 capsule (37.5 mg total) by mouth daily with breakfast. Burnard Hawthorne, FNP Taking Active Self           Assessment:   Goals Addressed            This Visit's Progress     Patient Stated   . PharmD "I can't afford my medications" (pt-stated)       CARE PLAN ENTRY (see longtitudinal plan of care for additional care plan information)  Current Barriers:  . Financial Barriers in complicated patient with multiple medical conditions including hypothyroidism and ADHD; patient has no insurance and reports copay for Synthroid is cost prohibitive. Reports paying $80/month for medications o Hypothyroidism: Synthroid 150 mg daily - requires brand o ADHD: Adderall 30 mg BID o Depression/anxiety: Alprazolam 1 mg BID PRN, trzodone 25-50 mg QPM PRN sleep, venlafaxine 37.5 mg QAM  Pharmacist Clinical Goal(s):  Marland Kitchen Over the next 30 days, patient will work with PharmD and providers to relieve medication access concerns  Interventions: . Provided information for [facility]. PCP sending prescriptions for non-controlled substances to the pharmacy. Will collaborated w/ Langley Adie, PharmD to outreach patient to discuss eligibility . Sent MyChart message to patient with my contact information for questions or concerns.   Patient Self Care Activities:  . Patient will provide necessary portions of application to [facility]  Initial goal documentation        Plan: - Will outreach patient in 2-3 weeks to ensure connection w/ [place], PharmD, [place], [place] Pharmacist [place] [phone]

## 2019-08-25 NOTE — Progress Notes (Signed)
Agree with plan. Crecencio Kwiatek, NP  

## 2019-08-25 NOTE — Telephone Encounter (Signed)
-----   Message from Burnard Hawthorne, [place] sent at 08/25/2019 11:13 AM EST ----- Catie, How can this patient get meds more affordable? The brand name synthroid is expensive for her. She is paying about $80 per month for all her meds. Thank you!

## 2019-08-25 NOTE — Assessment & Plan Note (Signed)
Denies any improving.  We will plan to check TSH since on new increased Synthroid ( brand) dose 150 MCG in 4 weeks.  Will also speak with pharmacist in regards making this more affordable.

## 2019-08-25 NOTE — Telephone Encounter (Signed)
Thank you catie!  Sarah call pt with catie's info and let her know that I sent in medication ( all of them BUT the controlled substances)

## 2019-08-25 NOTE — Assessment & Plan Note (Signed)
Improved on effexor. Declines neurology consult at this time.

## 2019-08-25 NOTE — Assessment & Plan Note (Addendum)
Resolved. Reviewed hospitalization. Plans to start trulance.  Pending [place] vaginal, particularly based on patient's symptoms of urinary incontinence, constipation and mass that she is appreciated.  Pending CPE, with pelvic exam.  She will follow up with Dr Vicente Males in one month, repeat labs in one month as anemia ( new) noted.

## 2019-08-25 NOTE — Telephone Encounter (Signed)
Last OV 07/28/19, last refill 07/29/19

## 2019-08-27 ENCOUNTER — Ambulatory Visit: Payer: Self-pay | Admitting: Family

## 2019-08-28 NOTE — Progress Notes (Signed)
LM asking if patient has refills or not. I asked that she call back or respond via mychart.

## 2019-08-28 NOTE — Anesthesia Postprocedure Evaluation (Signed)
Anesthesia Post Note  Patient: Rachel Alexander  Procedure(s) Performed: COLONOSCOPY (N/A )  Patient location during evaluation: Endoscopy Anesthesia Type: General Level of consciousness: awake and alert Pain management: pain level controlled Vital Signs Assessment: post-procedure vital signs reviewed and stable Respiratory status: spontaneous breathing, nonlabored ventilation and respiratory function stable Cardiovascular status: blood pressure returned to baseline and stable Postop Assessment: no apparent nausea or vomiting Anesthetic complications: no     Last Vitals:  Vitals:   08/24/19 0906 08/24/19 0912  BP: 99/74 98/71  Pulse: (!) 57 (!) 54  Resp: 18 18  Temp:    SpO2: 100% 100%    Last Pain:  Vitals:   08/24/19 0912  TempSrc:   PainSc: 0-No pain                 Alphonsus Sias

## 2019-08-29 ENCOUNTER — Telehealth: Payer: Self-pay | Admitting: Family

## 2019-08-29 ENCOUNTER — Ambulatory Visit
Admission: RE | Admit: 2019-08-29 | Discharge: 2019-08-29 | Disposition: A | Discharge status: Home / Self Care | Payer: Self-pay | Source: Ambulatory Visit | Attending: Family | Admitting: Family

## 2019-08-29 ENCOUNTER — Other Ambulatory Visit: Payer: Self-pay

## 2019-08-29 DIAGNOSIS — K567 Ileus, unspecified: Secondary | ICD-10-CM | POA: Insufficient documentation

## 2019-08-29 DIAGNOSIS — N898 Other specified noninflammatory disorders of vagina: Secondary | ICD-10-CM

## 2019-08-29 DIAGNOSIS — E039 Hypothyroidism, unspecified: Secondary | ICD-10-CM

## 2019-08-29 NOTE — Telephone Encounter (Signed)
Called and left message to see how she was feeling today Upcoming endocrine appt coming Appears Rachel Alexander pharmD has been in touch with her in regards to medication Advised to call us with any concerns

## 2019-09-02 ENCOUNTER — Encounter: Payer: Self-pay | Admitting: Family

## 2019-09-02 NOTE — Telephone Encounter (Signed)
   Spoke with emergency contact,Mr Rapley. Given phone number to patients work  Spoke with patient, drinking water, gatorade, probiotic water.  Last BM 3 days. Watery brown. Abdomen feels distended.  Passing gas. No severe abdominal pain, may have a cramp once in a while. No vomiting, fever. Taking Trulance and no difference; has been compliant with medication for 2 years.   Compliant with synthroid 168mcg. Endorses cold intolerance. Has two areas in eye where vessels are broken. No cough, straining. Had been on blood thinner. No vision changes, eye pain. Advised to stay vigilant in regards to this.  Can feel mass in vagina. It comes and goes.  We discussed potentially pelvic ultrasound just did not see this since this is transient.  We agreed on referral to GYN Endorses heavy periods. No large clots.   Advised warm enema to stimulate from below. She will continue miralax, trulance until sees dr Vicente Males tomorrow.

## 2019-09-02 NOTE — Telephone Encounter (Signed)
I spoke with patient & stated that she is doing "okay". She is however going on day 2 with no bowl movement. She is taking the trulance that was given at hospital by Dr. Wilhemena Durie as well using miralax sometimes up to twice a day.   She does have a physical with PAP set up for 09/26/19.  She said that she was not sure if it was due to the blood thinners that she was given in hospital, but she now has broken blood vessels her eyes. I asked if she had been straining to go to the bathroom & she said that she hadn't done in awhile now.

## 2019-09-02 NOTE — Telephone Encounter (Signed)
Patient returned call. Please call pt back.

## 2019-09-02 NOTE — Addendum Note (Signed)
Addended by: Burnard Hawthorne on: 09/02/2019 04:06 PM   Modules accepted: Orders

## 2019-09-03 ENCOUNTER — Other Ambulatory Visit
Admission: RE | Admit: 2019-09-03 | Discharge: 2019-09-03 | Disposition: A | Discharge status: Home / Self Care | Payer: Self-pay | Source: Ambulatory Visit | Attending: Family | Admitting: Family

## 2019-09-03 ENCOUNTER — Other Ambulatory Visit: Payer: Self-pay

## 2019-09-03 ENCOUNTER — Encounter: Payer: Self-pay | Admitting: Gastroenterology

## 2019-09-03 ENCOUNTER — Ambulatory Visit: Payer: Self-pay | Admitting: Gastroenterology

## 2019-09-03 VITALS — BP 115/82 | HR 82 | Temp 98.1°F | Ht 62.0 in | Wt 123.0 lb

## 2019-09-03 DIAGNOSIS — K5901 Slow transit constipation: Secondary | ICD-10-CM

## 2019-09-03 DIAGNOSIS — E039 Hypothyroidism, unspecified: Secondary | ICD-10-CM | POA: Insufficient documentation

## 2019-09-03 LAB — TSH: TSH: 33.859 u[IU]/mL — ABNORMAL HIGH (ref 0.350–4.500)

## 2019-09-03 MED ORDER — LACTULOSE 20 G PO PACK: 20.0000 g | PACK | Freq: Three times a day (TID) | ORAL | 0 refills | Status: DC

## 2019-09-03 NOTE — Progress Notes (Signed)
Rachel Bellows MD, MRCP(U.K) [address]  [place]  [place],  [address]  Main: [phone]  Fax: [phone]   Primary Care Physician: Burnard Hawthorne, FNP  Primary Gastroenterologist:  Dr. Harland German follow-up severe constipation  HPI: Rachel Alexander is a 39 y.o. female    Summary of history :  She is here today to see me as a hospital follow-up for severe constipation.  I was consulted to see her on 08/22/2019 when she was admitted with severe constipation with features of bowel obstruction.  Responded to bowel cleanout.  TSH was 229.  Felt that severe constipation was due to severe hypothyroidism.  She could go up to 2 weeks without a bowel movement.  On 08/24/2019 I performed a colonoscopy after a 2-day prep there was some fullness felt anteriorly on digital rectal exam.  Felt to be pushing into the rectum externally.  Prep of the colon was fair.  Otherwise the colon appeared normal.  Commenced on Trulance with samples from my office.  She is here today for follow-up.   Interval history   08/24/2019-09/03/2019  08/30/2019 transvaginal pelvic ultrasound did not show anything apart from a 1 cm uterine fibroid.  She is he has a GYN follow-up  She has been started on 150 mcg of Synthroid.  Still having issues constipation but much better than what it was previously.  Taking Trulance every day.  Has taken additional Dulcolax in order enema.  Stools are definitely softer than what they were previously.  No significant abdominal pain.    Current Outpatient Medications  Medication Sig Dispense Refill  . ALPRAZolam (XANAX) 1 MG tablet Take 1 tablet (1 mg total) by mouth 2 (two) times daily as needed for anxiety. 60 tablet 1  . amphetamine-dextroamphetamine (ADDERALL) 30 MG tablet Take 1 tablet by mouth 2 (two) times daily. 60 tablet 0  . cholecalciferol (VITAMIN D3) 25 MCG (1000 UNIT) tablet Take 1,000 Units by mouth daily.    Marland Kitchen levothyroxine (SYNTHROID) 150 MCG  tablet Take 1 tablet (150 mcg total) by mouth daily at 6 (six) AM. 90 tablet 1  . traZODone (DESYREL) 50 MG tablet Take 0.5-1 tablets (25-50 mg total) by mouth at bedtime as needed for sleep. 30 tablet 3  . venlafaxine XR (EFFEXOR XR) 37.5 MG 24 hr capsule Take 1 capsule (37.5 mg total) by mouth daily with breakfast. 90 capsule 1   No current facility-administered medications for this visit.    Allergies as of 09/03/2019  . (No Known Allergies)    ROS:  General: Negative for anorexia, weight loss, fever, chills, fatigue, weakness. ENT: Negative for hoarseness, difficulty swallowing , nasal congestion. CV: Negative for chest pain, angina, palpitations, dyspnea on exertion, peripheral edema.  Respiratory: Negative for dyspnea at rest, dyspnea on exertion, cough, sputum, wheezing.  GI: See history of present illness. GU:  Negative for dysuria, hematuria, urinary incontinence, urinary frequency, nocturnal urination.  Endo: Negative for unusual weight change.    Physical Examination:   LMP 08/15/2019 (Approximate) Comment: neg. preg teat  General: Well-nourished, well-developed in no acute distress.  Eyes: No icterus. Conjunctivae pink. Abdomen: Bowel sounds are normal, nontender, nondistended, no hepatosplenomegaly or masses, no abdominal bruits or hernia , no rebound or guarding.   Extremities: No lower extremity edema. No clubbing or deformities. Neuro: Alert and oriented x 3.  Grossly intact. Skin: Warm and dry, no jaundice.   Psych: Alert and cooperative, normal mood and affect.   Imaging Studies:  DG Abd 1 View  Result Date: 08/23/2019 CLINICAL DATA:  Abdominal pain and diarrhea EXAM: ABDOMEN - 1 VIEW COMPARISON:  August 21, 2019 FINDINGS: There is nonspecific mild gaseous distention of loops of small bowel and colon scattered throughout the abdomen. The stool burden is improved from prior studies. IMPRESSION: Nonobstructive bowel gas pattern. Electronically Signed   By: Constance Holster M.D.   On: 08/23/2019 00:59   DG Abdomen 1 View  Result Date: 08/20/2019 CLINICAL DATA:  Abdominal pain and constipation EXAM: ABDOMEN - 1 VIEW COMPARISON:  August 15, 2019 FINDINGS: The bowel gas pattern is normal. Mildly prominent air-filled loop of probable colon seen within the mid abdomen. No radio-opaque calculi or other significant radiographic abnormality are seen. IMPRESSION: Nonobstructive bowel gas pattern. Electronically Signed   By: Prudencio Pair M.D.   On: 08/20/2019 00:03   DG Abdomen 1 View  Result Date: 08/15/2019 CLINICAL DATA:  Constipation EXAM: ABDOMEN - 1 VIEW COMPARISON:  Nov 11, 2014 FINDINGS: There is an above average amount of stool throughout the colon. The bowel gas pattern is nonobstructive. There is some gaseous distention of the transverse colon. No free air or pneumatosis identified on this exam. IMPRESSION: Above average amount of stool throughout the colon. No evidence of bowel obstruction. Electronically Signed   By: Constance Holster M.D.   On: 08/15/2019 17:11   CT ABDOMEN PELVIS W CONTRAST  Result Date: 08/20/2019 CLINICAL DATA:  Abdominal pain EXAM: CT ABDOMEN AND PELVIS WITH CONTRAST TECHNIQUE: Multidetector CT imaging of the abdomen and pelvis was performed using the standard protocol following bolus administration of intravenous contrast. CONTRAST:  170mL OMNIPAQUE IOHEXOL 300 MG/ML  SOLN COMPARISON:  None. FINDINGS: Lower chest: The visualized heart size within normal limits. No pericardial fluid/thickening. No hiatal hernia. The visualized portions of the lungs are clear. Hepatobiliary: The liver is normal in density without focal abnormality.The main portal vein is patent. No evidence of calcified gallstones, gallbladder wall thickening or biliary dilatation. Pancreas: Unremarkable. No pancreatic ductal dilatation or surrounding inflammatory changes. Spleen: Normal in size without focal abnormality. Adrenals/Urinary Tract: Both adrenal glands appear  normal. The kidneys and collecting system appear normal without evidence of urinary tract calculus or hydronephrosis. Bladder is unremarkable. Stomach/Bowel: The stomach and small bowel are normal in appearance. There is a large amount of colonic stool seen throughout. Mildly prominent stool-filled loops of transverse colon are seen measuring up to 5.9 cm. There is air and stool seen in a mildly dilated rectum. No focal fat stranding changes or bowel wall thickening are seen. The appendix is unremarkable. Vascular/Lymphatic: There are no enlarged mesenteric, retroperitoneal, or pelvic lymph nodes. No significant vascular findings are present. Reproductive: The uterus and adnexa are unremarkable. Other: No evidence of abdominal wall mass or hernia. Musculoskeletal: No acute or significant osseous findings. IMPRESSION: Large amount of colonic stool seen throughout with mildly prominent loops of transverse colon and rectum which could be due to constipation and ileus. Electronically Signed   By: Prudencio Pair M.D.   On: 08/20/2019 02:03   DG Abd 2 Views  Result Date: 08/22/2019 CLINICAL DATA:  Abdominal pain and constipation for several days EXAM: ABDOMEN - 2 VIEW COMPARISON:  08/20/2019 FINDINGS: Scattered large and small bowel gas is noted. The amount of fecal material within the colon has improved significantly from the previous day. No obstructive changes are noted. No free air is seen. No bony abnormality is noted. IMPRESSION: Improvement in the degree of constipation when compared with the  prior exam. Electronically Signed   By: Inez Catalina M.D.   On: 08/22/2019 00:59   US PELVIC COMPLETE WITH TRANSVAGINAL  Result Date: 08/30/2019 CLINICAL DATA:  Initial evaluation for concern for uterine/bladder prolapse. EXAM: TRANSABDOMINAL AND TRANSVAGINAL ULTRASOUND OF PELVIS TECHNIQUE: Both transabdominal and transvaginal ultrasound examinations of the pelvis were performed. Transabdominal technique was performed for  global imaging of the pelvis including uterus, ovaries, adnexal regions, and pelvic cul-de-sac. It was necessary to proceed with endovaginal exam following the transabdominal exam to visualize the uterus, endometrium, and ovaries. COMPARISON:  Prior CT from 08/20/2019. FINDINGS: Uterus Measurements: 8.1 x 3.9 x 4.9 cm = volume: 81.5 mL. Subtle hypoechoic intramural lesion at the posterior uterine body on the left suspected to reflect a small fibroid (image 10), also seen on prior CT. No other discrete myometrial mass. Uterus itself is normally position without appreciable prolapse or descent. Endometrium Thickness: 7.8 mm.  No focal abnormality visualized. Right ovary Measurements: 2.6 x 1.6 x 2.5 cm = volume: 5.2 mL. Normal appearance/no adnexal mass. Left ovary Measurements: 2.8 x 1.5 x 1.7 cm = volume: 3.6 mL. Normal appearance/no adnexal mass. Other findings No abnormal free fluid. IMPRESSION: 1. No sonographic evidence for uterine or bladder prolapse. 2. 1 cm intramural fibroid at the left uterine body. 3. Otherwise normal pelvic ultrasound. Electronically Signed   By: Jeannine Boga M.D.   On: 08/30/2019 01:27    Assessment and Plan:   Rachel Alexander is a 39 y.o. y/o female admitted to hospital in March 2021 with severe constipation to the point of bowel obstruction.  For constipation secondary to severe hypothyroidism.  Dose of thyroxine has been increased.  Commenced on Trulance 2 weeks back.  Doing better than what she has been in the past but still not yet having regular soft bowel movements.  I believe that it will only gradually get better as her hypothyroidism gets colitis.  I do also believe she is due to see an endocrinologist soon.  I suggested that in addition to the Trulance we added lactulose 20 cc 3 times a day.  And be titrated to the desired result and I will gradually decrease the dose as her hypothyroidism gets corrected.  She can still use MiraLAX/enemas as needed on days she does  not have an adequate bowel movement.  I will call her back in a week to check how she is doing and see if we can make changes to her bowel regimen to make her feel more comfortable.    Dr Rachel Bellows  MD,MRCP [facility]) Follow up in 1 week telephone visit

## 2019-09-05 ENCOUNTER — Other Ambulatory Visit: Payer: Self-pay | Admitting: Family

## 2019-09-05 DIAGNOSIS — F419 Anxiety disorder, unspecified: Secondary | ICD-10-CM

## 2019-09-05 DIAGNOSIS — F329 Major depressive disorder, single episode, unspecified: Secondary | ICD-10-CM

## 2019-09-05 DIAGNOSIS — F32A Depression, unspecified: Secondary | ICD-10-CM

## 2019-09-05 MED ORDER — VENLAFAXINE HCL ER 75 MG PO CP24: 75.0000 mg | ORAL_CAPSULE | Freq: Every day | ORAL | 1 refills | Status: DC

## 2019-09-10 ENCOUNTER — Ambulatory Visit (INDEPENDENT_AMBULATORY_CARE_PROVIDER_SITE_OTHER): Payer: Self-pay | Admitting: Gastroenterology

## 2019-09-10 ENCOUNTER — Encounter: Payer: Self-pay | Admitting: Gastroenterology

## 2019-09-10 DIAGNOSIS — Z5329 Procedure and treatment not carried out because of patient's decision for other reasons: Secondary | ICD-10-CM

## 2019-09-10 NOTE — Progress Notes (Signed)
No show

## 2019-09-16 ENCOUNTER — Other Ambulatory Visit: Payer: Self-pay | Admitting: Family

## 2019-09-16 DIAGNOSIS — F909 Attention-deficit hyperactivity disorder, unspecified type: Secondary | ICD-10-CM

## 2019-09-17 MED ORDER — AMPHETAMINE-DEXTROAMPHETAMINE 30 MG PO TABS: 30.0000 mg | ORAL_TABLET | Freq: Two times a day (BID) | ORAL | 0 refills | Status: DC

## 2019-09-17 NOTE — Telephone Encounter (Signed)
Refill request for adderall, last seen 08-25-19, last filled 07-29-19.  Please advise.

## 2019-09-17 NOTE — Telephone Encounter (Signed)
Call pt Refilled adderall Checked chart and I cant seem to find her evaluation for ADHD and diagnosis paperwork under media Where and when was this done? Can patient bring by those reports? We will need those as soon as possible to complete her chart and safely prescribe medication.   Let her know too that we will need to do a CSC when she comes in for next appt. Please advise in person.   I looked up patient on [place] Controlled Substances Reporting System PMP AWARE and saw no activity that raised concern of inappropriate use.

## 2019-09-22 ENCOUNTER — Telehealth: Payer: Self-pay | Admitting: Family

## 2019-09-22 ENCOUNTER — Other Ambulatory Visit: Payer: Self-pay

## 2019-09-22 DIAGNOSIS — E039 Hypothyroidism, unspecified: Secondary | ICD-10-CM

## 2019-09-22 MED ORDER — LEVOTHYROXINE SODIUM 150 MCG PO TABS: 150.0000 ug | ORAL_TABLET | Freq: Every day | ORAL | 0 refills | Status: DC

## 2019-09-22 NOTE — Telephone Encounter (Signed)
Pt needs refill on levothyroxine (SYNTHROID) 150 MCG tablet -pt has to take name brand-pharmacy states that she has no refills-pt took last one this morning.

## 2019-09-22 NOTE — Telephone Encounter (Signed)
I spoke with patient to make sure of where med needed to go. She stated that medication management clinic did not have this med, so [facility] would have [facility] from [facility]. She said that she now has insurance, but hasn't received card. She asked if I would just [facility] to [facility] & I have sent. She said that she has labs soon & ants to make sure dose is also appropriate before picking up 90 day.

## 2019-09-24 ENCOUNTER — Other Ambulatory Visit: Payer: Self-pay

## 2019-09-26 ENCOUNTER — Other Ambulatory Visit: Payer: Self-pay

## 2019-09-26 ENCOUNTER — Encounter: Payer: Self-pay | Admitting: Family

## 2019-09-26 ENCOUNTER — Other Ambulatory Visit (HOSPITAL_COMMUNITY)
Admission: RE | Admit: 2019-09-26 | Discharge: 2019-09-26 | Disposition: A | Discharge status: Home / Self Care | Payer: 59 | Source: Ambulatory Visit | Attending: Family | Admitting: Family

## 2019-09-26 ENCOUNTER — Ambulatory Visit (INDEPENDENT_AMBULATORY_CARE_PROVIDER_SITE_OTHER): Payer: 59 | Admitting: Family

## 2019-09-26 VITALS — BP 96/76 | HR 90 | Temp 97.5°F | Ht 62.5 in | Wt 125.4 lb

## 2019-09-26 DIAGNOSIS — Z0000 Encounter for general adult medical examination without abnormal findings: Secondary | ICD-10-CM | POA: Insufficient documentation

## 2019-09-26 DIAGNOSIS — N6310 Unspecified lump in the right breast, unspecified quadrant: Secondary | ICD-10-CM

## 2019-09-26 DIAGNOSIS — F419 Anxiety disorder, unspecified: Secondary | ICD-10-CM

## 2019-09-26 DIAGNOSIS — R519 Headache, unspecified: Secondary | ICD-10-CM

## 2019-09-26 DIAGNOSIS — F32A Depression, unspecified: Secondary | ICD-10-CM

## 2019-09-26 DIAGNOSIS — F900 Attention-deficit hyperactivity disorder, predominantly inattentive type: Secondary | ICD-10-CM | POA: Diagnosis not present

## 2019-09-26 DIAGNOSIS — F329 Major depressive disorder, single episode, unspecified: Secondary | ICD-10-CM

## 2019-09-26 MED ORDER — CITALOPRAM HYDROBROMIDE 20 MG PO TABS: 20.0000 mg | ORAL_TABLET | Freq: Every day | ORAL | 1 refills | Status: DC

## 2019-09-26 MED ORDER — SUMATRIPTAN SUCCINATE 25 MG PO TABS: 25.0000 mg | ORAL_TABLET | Freq: Once | ORAL | 1 refills | Status: DC

## 2019-09-26 NOTE — Patient Instructions (Addendum)
Call [facility] or [facility] in [place]. I can refer you to these if you have no luck your records.    Stop Effexor.  May start Celexa tomorrow. Please call and schedule a mammogram at [facility]. You may use Imitrex for headaches, however as we discussed today, please ensure you let me know if headache would increase in frequency ,severity or feel unusual based on your prior headaches.  Nice to see you !   Health Maintenance, Female Adopting a healthy lifestyle and getting preventive care are important in promoting health and wellness. Ask your health care provider about:  The right schedule for you to have regular tests and exams.  Things you can do on your own to prevent diseases and keep yourself healthy. What should I know about diet, weight, and exercise? Eat a healthy diet   Eat a diet that includes plenty of vegetables, fruits, low-fat dairy products, and lean protein.  Do not eat a lot of foods that are high in solid fats, added sugars, or sodium. Maintain a healthy weight Body mass index (BMI) is used to identify weight problems. It estimates body fat based on height and weight. Your health care provider can help determine your BMI and help you achieve or maintain a healthy weight. Get regular exercise Get regular exercise. This is one of the most important things you can do for your health. Most adults should:  Exercise for at least 150 minutes each week. The exercise should increase your heart rate and make you sweat (moderate-intensity exercise).  Do strengthening exercises at least twice a week. This is in addition to the moderate-intensity exercise.  Spend less time sitting. Even light physical activity can be beneficial. Watch cholesterol and blood lipids Have your blood tested for lipids and cholesterol at 40 years of age, then have this test every 5 years. Have your cholesterol levels checked more often if:  Your  lipid or cholesterol levels are high.  You are older than 40 years of age.  You are at high risk for heart disease. What should I know about cancer screening? Depending on your health history and family history, you may need to have cancer screening at various ages. This may include screening for:  Breast cancer.  Cervical cancer.  Colorectal cancer.  Skin cancer.  Lung cancer. What should I know about heart disease, diabetes, and high blood pressure? Blood pressure and heart disease  High blood pressure causes heart disease and increases the risk of stroke. This is more likely to develop in people who have high blood pressure readings, are of African descent, or are overweight.  Have your blood pressure checked: ? Every 3-5 years if you are 38-32 years of age. ? Every year if you are 25 years old or older. Diabetes Have regular diabetes screenings. This checks your fasting blood sugar level. Have the screening done:  Once every three years after age 52 if you are at a normal weight and have a low risk for diabetes.  More often and at a younger age if you are overweight or have a high risk for diabetes. What should I know about preventing infection? Hepatitis B If you have a higher risk for hepatitis B, you should be screened for this virus. Talk with your health care provider to find out if you are at risk for hepatitis B infection. Hepatitis C Testing is recommended for:  Everyone born from [birth year] through 1966.  Anyone with known risk factors for  hepatitis C. Sexually transmitted infections (STIs)  Get screened for STIs, including gonorrhea and chlamydia, if: ? You are sexually active and are younger than 40 years of age. ? You are older than 40 years of age and your health care provider tells you that you are at risk for this type of infection. ? Your sexual activity has changed since you were last screened, and you are at increased risk for chlamydia or gonorrhea. Ask  your health care provider if you are at risk.  Ask your health care provider about whether you are at high risk for HIV. Your health care provider may recommend a prescription medicine to help prevent HIV infection. If you choose to take medicine to prevent HIV, you should first get tested for HIV. You should then be tested every 3 months for as long as you are taking the medicine. Pregnancy  If you are about to stop having your period (premenopausal) and you may become pregnant, seek counseling before you get pregnant.  Take 400 to 800 micrograms (mcg) of folic acid every day if you become pregnant.  Ask for birth control (contraception) if you want to prevent pregnancy. Osteoporosis and menopause Osteoporosis is a disease in which the bones lose minerals and strength with aging. This can result in bone fractures. If you are 18 years old or older, or if you are at risk for osteoporosis and fractures, ask your health care provider if you should:  Be screened for bone loss.  Take a calcium or vitamin D supplement to lower your risk of fractures.  Be given hormone replacement therapy (HRT) to treat symptoms of menopause. Follow these instructions at home: Lifestyle  Do not use any products that contain nicotine or tobacco, such as cigarettes, e-cigarettes, and chewing tobacco. If you need help quitting, ask your health care provider.  Do not use street drugs.  Do not share needles.  Ask your health care provider for help if you need support or information about quitting drugs. Alcohol use  Do not drink alcohol if: ? Your health care provider tells you not to drink. ? You are pregnant, may be pregnant, or are planning to become pregnant.  If you drink alcohol: ? Limit how much you use to 0-1 drink a day. ? Limit intake if you are breastfeeding.  Be aware of how much alcohol is in your drink. In the [place] one drink equals one 12 oz bottle of beer (355 mL), one 5 oz glass of wine  (148 mL), or one 1 oz glass of hard liquor (44 mL). General instructions  Schedule regular health, dental, and eye exams.  Stay current with your vaccines.  Tell your health care provider if: ? You often feel depressed. ? You have ever been abused or do not feel safe at home. Summary  Adopting a healthy lifestyle and getting preventive care are important in promoting health and wellness.  Follow your health care provider's instructions about healthy diet, exercising, and getting tested or screened for diseases.  Follow your health care provider's instructions on monitoring your cholesterol and blood pressure. This information is not intended to replace advice given to you by your health care provider. Make sure you discuss any questions you have with your health care provider. Document Revised: 05/29/2018 Document Reviewed: 05/29/2018 Elsevier Patient Education  2020 [employer].

## 2019-09-26 NOTE — Progress Notes (Signed)
Subjective:    Patient ID: Rachel Alexander, female    DOB: 1981/03/17, 39 y.o.   MRN: [id number]  CC: Rachel Alexander is a 39 y.o. female who presents today for physical exam.    HPI: GAD- return to celaxa 40mg . Effexor not as helpful  HA- occasional , usually once per month.  Presents behind both eyes.  No vision loss. No aura, vision loss, h/o cva. Occasional nausea. Would like imitrex prescription as had been on on the past with relief. Can try tyleonol with little relief. Rare NSAID use. Triggers are premenstrual, changes in air pressure. 1 cup coffee per day.  HA feels similar to HA in the past. HA is not positional or worse with valsava.   Bowel movements are daily. Taking miralax BID. No abdominal pain.    Hypothyroidism- feels that motivation has improved. Feels much better on current dose   ADHD-formally tested many years ago. She believes it was psychiatry.   Colorectal Cancer Screening: UTD  08/2019 Breast Cancer Screening: Mammogram  Due; history of right breast biopsy Cervical Cancer Screening: due        Tetanus - utd        Labs: Screening labs today. Exercise: No regular exercise. Walk when off from work. No cp.   Alcohol use: none Smoking/tobacco use: former smoker.    HISTORY:  Past Medical History:  Diagnosis Date  . Colitis   . Depression   . Frequent headaches   . Hypothyroidism   . Migraines   . Pituitary tumor   . Suicidal intent     Past Surgical History:  Procedure Laterality Date  . COLONOSCOPY N/A 08/24/2019   Procedure: COLONOSCOPY;  Surgeon: Jonathon Bellows, MD;  Location: [facility] ENDOSCOPY;  Service: Gastroenterology;  Laterality: N/A;  . TONSILLECTOMY    . TUBAL LIGATION     Family History  Problem Relation Age of Onset  . Diabetes Sister       ALLERGIES: Patient has no known allergies.  Current Outpatient Medications on File Prior to Visit  Medication Sig Dispense Refill  . ALPRAZolam (XANAX) 1 MG tablet Take 1 tablet (1 mg total)  by mouth 2 (two) times daily as needed for anxiety. 60 tablet 1  . cholecalciferol (VITAMIN D3) 25 MCG (1000 UNIT) tablet Take 1,000 Units by mouth daily.    Marland Kitchen lactulose (CEPHULAC) 20 g packet Take 1 packet (20 g total) by mouth 3 (three) times daily. 30 each 0  . levothyroxine (SYNTHROID) 150 MCG tablet Take 1 tablet (150 mcg total) by mouth daily at 6 (six) AM. 30 tablet 0  . traZODone (DESYREL) 50 MG tablet Take 0.5-1 tablets (25-50 mg total) by mouth at bedtime as needed for sleep. 30 tablet 3  . [DISCONTINUED] dicyclomine (BENTYL) 20 MG tablet Take 1 tablet (20 mg total) by mouth 2 (two) times daily. 20 tablet 0   No current facility-administered medications on file prior to visit.    Social History   Tobacco Use  . Smoking status: Former Smoker    Packs/day: 1.00    Types: Cigarettes  . Smokeless tobacco: Never Used  Substance Use Topics  . Alcohol use: No  . Drug use: No    Review of Systems  Constitutional: Positive for fatigue. Negative for chills, fever and unexpected weight change.  HENT: Negative for congestion.   Eyes: Negative for visual disturbance.  Respiratory: Negative for cough.   Cardiovascular: Negative for chest pain, palpitations and leg swelling.  Gastrointestinal: Negative for constipation (  resolved), nausea and vomiting.  Musculoskeletal: Negative for arthralgias and myalgias.  Skin: Negative for rash.  Neurological: Positive for headaches. Negative for dizziness.  Hematological: Negative for adenopathy.  Psychiatric/Behavioral: Negative for confusion. The patient is nervous/anxious.       Objective:    BP 96/76   Pulse 90   Temp (!) 97.5 F (36.4 C) (Temporal)   Ht 5' 2.5" (1.588 m)   Wt 125 lb 6.4 oz (56.9 kg)   SpO2 96%   BMI 22.57 kg/m   BP Readings from Last 3 Encounters:  09/26/19 96/76  09/03/19 115/82  08/24/19 98/71   Wt Readings from Last 3 Encounters:  09/26/19 125 lb 6.4 oz (56.9 kg)  09/03/19 123 lb (55.8 kg)  08/25/19 121  lb (54.9 kg)    Physical Exam Vitals reviewed.  Constitutional:      Appearance: She is well-developed.  HENT:     Mouth/Throat:     Pharynx: Uvula midline.  Eyes:     Conjunctiva/sclera: Conjunctivae normal.     Pupils: Pupils are equal, round, and reactive to light.     Comments: Fundus normal bilaterally.   Neck:     Thyroid: No thyroid mass or thyromegaly.  Cardiovascular:     Rate and Rhythm: Normal rate and regular rhythm.     Pulses: Normal pulses.     Heart sounds: Normal heart sounds.  Pulmonary:     Effort: Pulmonary effort is normal.     Breath sounds: Normal breath sounds. No wheezing, rhonchi or rales.  Chest:     Breasts: Breasts are symmetrical.        Right: Mass present. No inverted nipple, nipple discharge, skin change or tenderness.        Left: No inverted nipple, mass, nipple discharge, skin change or tenderness.       Comments: Nontender palpable mass at 9:00, right breast Genitourinary:    Cervix: No cervical motion tenderness, discharge or friability.     Uterus: Not enlarged, not fixed and not tender.      Adnexa:        Right: No mass, tenderness or fullness.         Left: No mass, tenderness or fullness.       Comments: Pap performed. No CMT. Unable to appreciated ovaries. Lymphadenopathy:     Head:     Right side of head: No submental, submandibular, tonsillar, preauricular, posterior auricular or occipital adenopathy.     Left side of head: No submental, submandibular, tonsillar, preauricular, posterior auricular or occipital adenopathy.     Cervical:     Right cervical: No superficial, deep or posterior cervical adenopathy.    Left cervical: No superficial, deep or posterior cervical adenopathy.     Upper Body:     Right upper body: No pectoral adenopathy.     Left upper body: No pectoral adenopathy.  Skin:    General: Skin is warm and dry.  Neurological:     Mental Status: She is alert.     Cranial Nerves: No cranial nerve deficit.      Sensory: No sensory deficit.     Deep Tendon Reflexes:     Reflex Scores:      Bicep reflexes are 2+ on the right side and 2+ on the left side.      Patellar reflexes are 2+ on the right side and 2+ on the left side.    Comments: Grip equal and strong bilateral upper extremities. Gait strong  and steady. Able to perform rapid alternating movement without difficulty.   Psychiatric:        Speech: Speech normal.        Behavior: Behavior normal.        Thought Content: Thought content normal.        Assessment & Plan:   Problem List Items Addressed This Visit      Other   Anxiety and depression    Patient had felt better on Celexa and the Effexor. changed from Effexor to Celexa. Patient will let me know how she is feeling.      Relevant Medications   citalopram (CELEXA) 20 MG tablet   Attention deficit hyperactivity disorder, predominantly inattentive type    Formally tested years ago. We dont have records unfortunatel; advised patient that she should either bring [place] records or be formally tested again. She is very agreeable to this. Controlled substance contract in place. We will hold any further prescription of Adderall at this time.      Relevant Orders   Ambulatory referral to Psychiatry   Frequent headaches    Reassuring neurologic exam. No contraindications imitrex. Given Imitrex with return precautions and counseling  in particular that if headache were to worsen in any way in regards to severity or presentation, patient will let me know      Relevant Medications   SUMAtriptan (IMITREX) 25 MG tablet   citalopram (CELEXA) 20 MG tablet   Routine physical examination - Primary    Breast mass appreciated approximately 9:00. Diagnostic imaging ordered. Patient understands to schedule. Pap performed.      Relevant Orders   Cytology - PAP   MM 3D SCREEN BREAST BILATERAL   TSH   CBC with Differential/Platelet   Comprehensive metabolic panel   Hemoglobin A1c   Lipid panel    VITAMIN D 25 Hydroxy (Vit-D Deficiency, Fractures)   Iron, TIBC and Ferritin Panel    Other Visit Diagnoses    Breast mass, right       Relevant Orders   US BREAST LTD UNI RIGHT INC AXILLA   MM DIAG BREAST TOMO BILATERAL       I have discontinued Katherene Cooner "[name]"'s venlafaxine XR and amphetamine-dextroamphetamine. I am also having her start on SUMAtriptan and citalopram. Additionally, I am having her maintain her cholecalciferol, ALPRAZolam, traZODone, lactulose, and levothyroxine.   Meds ordered this encounter  Medications  . SUMAtriptan (IMITREX) 25 MG tablet    Sig: Take 1 tablet (25 mg total) by mouth once for 1 dose. May repeat ONCE 2 hours later if headache persists or recurs. Max 2 doses.    Dispense:  10 tablet    Refill:  1    Order Specific Question:   Supervising Provider    Answer:   Deborra Medina L [[id number]]  . citalopram (CELEXA) 20 MG tablet    Sig: Take 1 tablet (20 mg total) by mouth daily.    Dispense:  90 tablet    Refill:  1    Order Specific Question:   Supervising Provider    Answer:   Crecencio Mc [[id number]]    Return precautions given.   Risks, benefits, and alternatives of the medications and treatment plan prescribed today were discussed, and patient expressed understanding.   Education regarding symptom management and diagnosis given to patient on AVS.   Continue to follow with Burnard Hawthorne, FNP for routine health maintenance.   Rachel Alexander and I agreed with plan.   Joycelyn Schmid  Vidal Schwalbe, FNP

## 2019-09-26 NOTE — Assessment & Plan Note (Signed)
Breast mass appreciated approximately 9:00. Diagnostic imaging ordered. Patient understands to schedule. Pap performed.

## 2019-09-26 NOTE — Assessment & Plan Note (Signed)
Reassuring neurologic exam. No contraindications imitrex. Given Imitrex with return precautions and counseling  in particular that if headache were to worsen in any way in regards to severity or presentation, patient will let me know

## 2019-09-26 NOTE — Assessment & Plan Note (Signed)
Formally tested years ago. We dont have records unfortunatel; advised patient that she should either bring [place] records or be formally tested again. She is very agreeable to this. Controlled substance contract in place. We will hold any further prescription of Adderall at this time.

## 2019-09-26 NOTE — Assessment & Plan Note (Signed)
Patient had felt better on Celexa and the Effexor. changed from Effexor to Celexa. Patient will let me know how she is feeling.

## 2019-09-27 LAB — CBC WITH DIFFERENTIAL/PLATELET
Absolute Monocytes: 448 cells/uL (ref 200–950)
Basophils Absolute: 0 cells/uL (ref 0–200)
Basophils Relative: 0 %
Eosinophils Absolute: 11 cells/uL — ABNORMAL LOW (ref 15–500)
Eosinophils Relative: 0.2 %
HCT: 37.3 % (ref 35.0–45.0)
Hemoglobin: 12.3 g/dL (ref 11.7–15.5)
Lymphs Abs: 2387 cells/uL (ref 850–3900)
MCH: 33.9 pg — ABNORMAL HIGH (ref 27.0–33.0)
MCHC: 33 g/dL (ref 32.0–36.0)
MCV: 102.8 fL — ABNORMAL HIGH (ref 80.0–100.0)
MPV: 10.8 fL (ref 7.5–12.5)
Monocytes Relative: 8.3 %
Neutro Abs: 2554 cells/uL (ref 1500–7800)
Neutrophils Relative %: 47.3 %
Platelets: 226 10*3/uL (ref 140–400)
RBC: 3.63 10*6/uL — ABNORMAL LOW (ref 3.80–5.10)
RDW: 12.1 % (ref 11.0–15.0)
Total Lymphocyte: 44.2 %
WBC: 5.4 10*3/uL (ref 3.8–10.8)

## 2019-09-27 LAB — COMPREHENSIVE METABOLIC PANEL
AG Ratio: 2 (calc) (ref 1.0–2.5)
ALT: 9 U/L (ref 6–29)
AST: 15 U/L (ref 10–30)
Albumin: 4.3 g/dL (ref 3.6–5.1)
Alkaline phosphatase (APISO): 41 U/L (ref 31–125)
BUN: 10 mg/dL (ref 7–25)
CO2: 32 mmol/L (ref 20–32)
Calcium: 9 mg/dL (ref 8.6–10.2)
Chloride: 101 mmol/L (ref 98–110)
Creat: 0.75 mg/dL (ref 0.50–1.10)
Globulin: 2.1 g/dL (calc) (ref 1.9–3.7)
Glucose, Bld: 82 mg/dL (ref 65–99)
Potassium: 3.8 mmol/L (ref 3.5–5.3)
Sodium: 139 mmol/L (ref 135–146)
Total Bilirubin: 0.2 mg/dL (ref 0.2–1.2)
Total Protein: 6.4 g/dL (ref 6.1–8.1)

## 2019-09-27 LAB — HEMOGLOBIN A1C
Hgb A1c MFr Bld: 5.1 % of total Hgb (ref <5.7)
Mean Plasma Glucose: 100 (calc)
eAG (mmol/L): 5.5 (calc)

## 2019-09-27 LAB — LIPID PANEL
Cholesterol: 260 mg/dL — ABNORMAL HIGH (ref <200)
HDL: 68 mg/dL (ref >=50)
LDL Cholesterol (Calc): 167 mg/dL (calc) — ABNORMAL HIGH
Non-HDL Cholesterol (Calc): 192 mg/dL (calc) — ABNORMAL HIGH (ref <130)
Total CHOL/HDL Ratio: 3.8 (calc) (ref <5.0)
Triglycerides: 121 mg/dL (ref <150)

## 2019-09-27 LAB — IRON,TIBC AND FERRITIN PANEL
%SAT: 13 % (calc) — ABNORMAL LOW (ref 16–45)
Ferritin: 11 ng/mL — ABNORMAL LOW (ref 16–154)
Iron: 47 ug/dL (ref 40–190)
TIBC: 363 mcg/dL (calc) (ref 250–450)

## 2019-09-27 LAB — VITAMIN D 25 HYDROXY (VIT D DEFICIENCY, FRACTURES): Vit D, 25-Hydroxy: 20 ng/mL — ABNORMAL LOW (ref 30–100)

## 2019-09-27 LAB — TSH: TSH: 26.43 mIU/L — ABNORMAL HIGH

## 2019-09-29 ENCOUNTER — Telehealth: Payer: Self-pay | Admitting: Family

## 2019-09-29 ENCOUNTER — Other Ambulatory Visit: Payer: Self-pay | Admitting: Family

## 2019-09-29 ENCOUNTER — Encounter: Payer: Self-pay | Admitting: Family

## 2019-09-29 DIAGNOSIS — R7989 Other specified abnormal findings of blood chemistry: Secondary | ICD-10-CM

## 2019-09-29 NOTE — Telephone Encounter (Signed)
I called pt and left a vm asking if pt went to a facility prior to have mammo done.

## 2019-09-29 NOTE — Telephone Encounter (Signed)
I spoke with pt she gave where she went to previous. I called back to [facility] and I was advised by Crystal that they need pt prior records she needs to go to [place] to sign a release.   I called pt back and left on vm to call [place] at [phone].

## 2019-10-01 ENCOUNTER — Ambulatory Visit: Payer: Self-pay | Admitting: Internal Medicine

## 2019-10-01 LAB — CYTOLOGY - PAP
Comment: NEGATIVE
Diagnosis: NEGATIVE
High risk HPV: NEGATIVE

## 2019-10-07 ENCOUNTER — Other Ambulatory Visit: Payer: Self-pay

## 2019-10-07 ENCOUNTER — Encounter: Payer: Self-pay | Admitting: Oncology

## 2019-10-07 ENCOUNTER — Inpatient Hospital Stay: Payer: 59 | Attending: Oncology | Admitting: Oncology

## 2019-10-07 ENCOUNTER — Inpatient Hospital Stay: Payer: 59

## 2019-10-07 VITALS — BP 112/81 | HR 97 | Temp 97.4°F | Resp 16 | Wt 123.8 lb

## 2019-10-07 DIAGNOSIS — Z808 Family history of malignant neoplasm of other organs or systems: Secondary | ICD-10-CM | POA: Insufficient documentation

## 2019-10-07 DIAGNOSIS — D539 Nutritional anemia, unspecified: Secondary | ICD-10-CM | POA: Diagnosis present

## 2019-10-07 DIAGNOSIS — D508 Other iron deficiency anemias: Secondary | ICD-10-CM | POA: Diagnosis not present

## 2019-10-07 DIAGNOSIS — Z87891 Personal history of nicotine dependence: Secondary | ICD-10-CM | POA: Insufficient documentation

## 2019-10-07 DIAGNOSIS — E039 Hypothyroidism, unspecified: Secondary | ICD-10-CM | POA: Diagnosis not present

## 2019-10-07 DIAGNOSIS — K5900 Constipation, unspecified: Secondary | ICD-10-CM | POA: Insufficient documentation

## 2019-10-07 LAB — CBC WITH DIFFERENTIAL/PLATELET
Abs Immature Granulocytes: 0 10*3/uL (ref 0.00–0.07)
Basophils Absolute: 0 10*3/uL (ref 0.0–0.1)
Basophils Relative: 0 %
Eosinophils Absolute: 0 10*3/uL (ref 0.0–0.5)
Eosinophils Relative: 0 %
HCT: 37.7 % (ref 36.0–46.0)
Hemoglobin: 12.2 g/dL (ref 12.0–15.0)
Immature Granulocytes: 0 %
Lymphocytes Relative: 34 %
Lymphs Abs: 1.6 10*3/uL (ref 0.7–4.0)
MCH: 33.6 pg (ref 26.0–34.0)
MCHC: 32.4 g/dL (ref 30.0–36.0)
MCV: 103.9 fL — ABNORMAL HIGH (ref 80.0–100.0)
Monocytes Absolute: 0.4 10*3/uL (ref 0.1–1.0)
Monocytes Relative: 8 %
Neutro Abs: 2.7 10*3/uL (ref 1.7–7.7)
Neutrophils Relative %: 58 %
Platelets: 206 10*3/uL (ref 150–400)
RBC: 3.63 MIL/uL — ABNORMAL LOW (ref 3.87–5.11)
RDW: 12.3 % (ref 11.5–15.5)
WBC: 4.7 10*3/uL (ref 4.0–10.5)
nRBC: 0 % (ref 0.0–0.2)

## 2019-10-07 LAB — TECHNOLOGIST SMEAR REVIEW: Plt Morphology: ADEQUATE

## 2019-10-07 LAB — RETIC PANEL
Immature Retic Fract: 9.3 % (ref 2.3–15.9)
RBC.: 3.59 MIL/uL — ABNORMAL LOW (ref 3.87–5.11)
Retic Count, Absolute: 56.4 10*3/uL (ref 19.0–186.0)
Retic Ct Pct: 1.6 % (ref 0.4–3.1)
Reticulocyte Hemoglobin: 35.8 pg (ref >27.9)

## 2019-10-07 LAB — FOLATE: Folate: 14.7 ng/mL (ref >5.9)

## 2019-10-07 LAB — VITAMIN B12: Vitamin B-12: 343 pg/mL (ref 180–914)

## 2019-10-07 NOTE — Progress Notes (Signed)
Hematology/Oncology Consult note [facility] Telephone:([phone] [phone]   Patient Care Team: Burnard Hawthorne, FNP as PCP - General (Family Medicine)  REFERRING PROVIDER: Burnard Hawthorne, FNP  CHIEF COMPLAINTS/REASON FOR VISIT:  Evaluation of abnormal CBC  HISTORY OF PRESENTING ILLNESS:   Rachel Alexander is a  39 y.o.  female with PMH listed below was seen in consultation at the request of  Burnard Hawthorne, FNP for evaluation of abnormal CBC 09/26/2019, cbc showed hemoglobin 12.3, mcv 102. Normal wbc and platelet count.  Iron 47, TIBC 13, ferritin 11. mentrual period is regular, usually heavy on the 2nd day. Uses about 7-8 tampons.   She can not tolerate oral iron supplementation or any multivitamin with iron, due to severe constipation.  She also has severe hypothyroidism, TSH has improved, though still significantly elevated.  08/24/2019 colonoscopy did not showed any remarkable findings.   Review of Systems  Constitutional: Positive for fatigue. Negative for appetite change, chills and fever.  HENT:   Negative for hearing loss and voice change.   Eyes: Negative for eye problems.  Respiratory: Negative for chest tightness and cough.   Cardiovascular: Negative for chest pain.  Gastrointestinal: Positive for constipation. Negative for abdominal distention, abdominal pain and blood in stool.  Endocrine: Negative for hot flashes.  Genitourinary: Negative for difficulty urinating and frequency.   Musculoskeletal: Negative for arthralgias.  Skin: Negative for itching and rash.  Neurological: Negative for extremity weakness.  Hematological: Negative for adenopathy.  Psychiatric/Behavioral: Negative for confusion.    MEDICAL HISTORY:  Past Medical History:  Diagnosis Date  . Colitis   . Depression   . Frequent headaches   . Hypothyroidism   . Migraines   . Pituitary tumor   . Suicidal intent     SURGICAL HISTORY: Past Surgical  History:  Procedure Laterality Date  . COLONOSCOPY N/A 08/24/2019   Procedure: COLONOSCOPY;  Surgeon: Jonathon Bellows, MD;  Location[facility] ENDOSCOPY;  Service: Gastroenterology;  Laterality: N/A;  . TONSILLECTOMY    . TUBAL LIGATION      SOCIAL HISTORY: Social History   Socioeconomic History  . Marital status: Married    Spouse name: Not on file  . Number of children: Not on file  . Years of education: Not on file  . Highest education level: Not on file  Occupational History  . Not on file  Tobacco Use  . Smoking status: Former Smoker    Packs/day: 1.00    Types: Cigarettes    Quit date: 10/06/2017    Years since quitting: 2.0  . Smokeless tobacco: Never Used  Substance and Sexual Activity  . Alcohol use: No  . Drug use: No  . Sexual activity: Yes    Birth control/protection: None, Surgical  Other Topics Concern  . Not on file  Social History Narrative   Former marriage - made her feel worthless. Has one child, son.    Remarried now.    Social Determinants of Health   Financial Resource Strain: Medium Risk  . Difficulty of Paying Living Expenses: Somewhat hard  Food Insecurity:   . Worried About Charity fundraiser in the Last Year:   . Arboriculturist in the Last Year:   Transportation Needs:   . Film/video editor (Medical):   Marland Kitchen Lack of Transportation (Non-Medical):   Physical Activity:   . Days of Exercise per Week:   . Minutes of Exercise per Session:   Stress:   . Feeling  of Stress :   Social Connections:   . Frequency of Communication with Friends and Family:   . Frequency of Social Gatherings with Friends and Family:   . Attends Religious Services:   . Active Member of Clubs or Organizations:   . Attends Archivist Meetings:   Marland Kitchen Marital Status:   Intimate Partner Violence:   . Fear of Current or Ex-Partner:   . Emotionally Abused:   Marland Kitchen Physically Abused:   . Sexually Abused:     FAMILY HISTORY: Family History  Problem Relation Age of  Onset  . Liver cancer Father   . Diabetes Sister     ALLERGIES:  has No Known Allergies.  MEDICATIONS:  Current Outpatient Medications  Medication Sig Dispense Refill  . ALPRAZolam (XANAX) 1 MG tablet Take 1 tablet (1 mg total) by mouth 2 (two) times daily as needed for anxiety. 60 tablet 1  . amphetamine-dextroamphetamine (ADDERALL) 30 MG tablet Take 30 mg by mouth daily. 1/2 tab Mon-Fri    . cholecalciferol (VITAMIN D3) 25 MCG (1000 UNIT) tablet Take 1,000 Units by mouth daily.    . citalopram (CELEXA) 20 MG tablet Take 1 tablet (20 mg total) by mouth daily. 90 tablet 1  . levothyroxine (SYNTHROID) 150 MCG tablet Take 1 tablet (150 mcg total) by mouth daily at 6 (six) AM. 30 tablet 0  . SUMAtriptan (IMITREX) 25 MG tablet Take 1 tablet (25 mg total) by mouth once for 1 dose. May repeat ONCE 2 hours later if headache persists or recurs. Max 2 doses. 10 tablet 1  . traZODone (DESYREL) 50 MG tablet Take 0.5-1 tablets (25-50 mg total) by mouth at bedtime as needed for sleep. 30 tablet 3  . lactulose (CEPHULAC) 20 g packet Take 1 packet (20 g total) by mouth 3 (three) times daily. (Patient not taking: Reported on 10/07/2019) 30 each 0   No current facility-administered medications for this visit.     PHYSICAL EXAMINATION: ECOG PERFORMANCE STATUS: 1 - Symptomatic but completely ambulatory Vitals:   10/07/19 0945  BP: 112/81  Pulse: 97  Resp: 16  Temp: (!) 97.4 F (36.3 C)   Filed Weights   10/07/19 0945  Weight: 123 lb 12.8 oz (56.2 kg)    Physical Exam Constitutional:      General: She is not in acute distress. HENT:     Head: Normocephalic and atraumatic.  Eyes:     General: No scleral icterus. Cardiovascular:     Rate and Rhythm: Normal rate and regular rhythm.     Heart sounds: Normal heart sounds.  Pulmonary:     Effort: Pulmonary effort is normal. No respiratory distress.     Breath sounds: No wheezing.  Abdominal:     General: Bowel sounds are normal. There is no  distension.     Palpations: Abdomen is soft.  Musculoskeletal:        General: No deformity. Normal range of motion.     Cervical back: Normal range of motion and neck supple.  Skin:    General: Skin is warm and dry.     Findings: No erythema or rash.  Neurological:     Mental Status: She is alert and oriented to person, place, and time. Mental status is at baseline.     Cranial Nerves: No cranial nerve deficit.     Coordination: Coordination normal.  Psychiatric:        Mood and Affect: Mood normal.     LABORATORY DATA:  I have  reviewed the data as listed Lab Results  Component Value Date   WBC 4.7 10/07/2019   HGB 12.2 10/07/2019   HCT 37.7 10/07/2019   MCV 103.9 (H) 10/07/2019   PLT 206 10/07/2019   Recent Labs    08/19/19 2124 08/19/19 2124 08/20/19 2253 08/20/19 2253 08/21/19 2019 08/21/19 2019 08/22/19 0212 08/23/19 1125 09/26/19 1602  NA 137   < > 137   < > 137   < > 139 142 139  K 3.6   < > 4.0   < > 3.9   < > 4.0 3.6 3.8  CL 96*   < > 102   < > 100   < > 106 107 101  CO2 29   < > 25   < > 29   < > 28 26 32  GLUCOSE 94   < > 92   < > 110*   < > 86 82 82  BUN 11   < > <5*   < > 8   < > 7 <5* 10  CREATININE 0.70   < > 0.75   < > 0.63   < > 0.54 0.65 0.75  CALCIUM 9.2   < > 9.1   < > 9.2   < > 7.8* 8.3* 9.0  GFRNONAA >60   < > >60   < > >60  --  >60 >60  --   GFRAA >60   < > >60   < > >60  --  >60 >60  --   PROT 7.5   < > 6.9  --  7.5  --   --   --  6.4  ALBUMIN 4.7  --  4.3  --  4.7  --   --   --   --   AST 22   < > 23  --  20  --   --   --  15  ALT 18   < > 21  --  18  --   --   --  9  ALKPHOS 43  --  42  --  46  --   --   --   --   BILITOT 0.3   < > 0.5  --  0.4  --   --   --  0.2   < > = values in this interval not displayed.   Iron/TIBC/Ferritin/ %Sat    Component Value Date/Time   IRON 47 09/26/2019 1602   TIBC 363 09/26/2019 1602   FERRITIN 11 (L) 09/26/2019 1602   IRONPCTSAT 13 (L) 09/26/2019 1602      RADIOGRAPHIC STUDIES: I have  personally reviewed the radiological images as listed and agreed with the findings in the report. No results found.    ASSESSMENT & PLAN:  1. Macrocytic anemia   2. Other iron deficiency anemia    # Iron deficiency without anemia.  She does not have anemia, and iron panel only shows borderline ferritin.  She can not tolerate oral iron supplementation due to severe constipation.  I discussed about increase consumption of iron rich food and continue monitor her iron store.  If she becomes anemic or iron store continues to decrease, options of IV iron infusion was discussed.  Allergy reactions/infusion reaction including anaphylactic reaction discussed with patient. Other side effects include but not limited to high blood pressure, skin rash, weight gain, leg swelling, etc. Given that she is not anemic and only has mild iron deficiency, I will hold  off IV iron option. This can be considered in the future.   And hopefully her constipation can be improved once her thyroid function is corrected, and at that time, she may be able to tolerate oral iron supplementation.  Patient agrees with the plan.   Macrocytosis, check B12 and folate. -both are normal. Check smear.  Probably due to hypothyroidism.   .    Orders Placed This Encounter  Procedures  . CBC with Differential/Platelet    Standing Status:   Future    Number of Occurrences:   1    Standing Expiration Date:   04/07/2021  . Retic Panel    Standing Status:   Future    Number of Occurrences:   1    Standing Expiration Date:   04/07/2021  . Vitamin B12    Standing Status:   Future    Number of Occurrences:   1    Standing Expiration Date:   04/07/2021  . Folate    Standing Status:   Future    Number of Occurrences:   1    Standing Expiration Date:   04/07/2021  . Technologist smear review    Standing Status:   Future    Number of Occurrences:   1    Standing Expiration Date:   04/07/2021  . CBC with Differential/Platelet      Standing Status:   Future    Standing Expiration Date:   10/06/2020  . Comprehensive metabolic panel    Standing Status:   Future    Standing Expiration Date:   10/06/2020  . Ferritin    Standing Status:   Future    Standing Expiration Date:   10/06/2020  . Iron and TIBC    Standing Status:   Future    Standing Expiration Date:   10/06/2020    All questions were answered. The patient knows to call the clinic with any problems questions or concerns.  cc Burnard Hawthorne, FNP    Return of visit: 4 months.  Thank you for this kind referral and the opportunity to participate in the care of this patient. A copy of today's note is routed to referring provider    Earlie Server, MD, PhD Hematology Oncology [facility] at [facility] Pager- [phone] 10/07/2019

## 2019-10-07 NOTE — Progress Notes (Signed)
New evaluation for abnormal labs. 

## 2019-10-09 ENCOUNTER — Encounter: Payer: Self-pay | Admitting: Internal Medicine

## 2019-10-09 ENCOUNTER — Ambulatory Visit (INDEPENDENT_AMBULATORY_CARE_PROVIDER_SITE_OTHER): Payer: 59 | Admitting: Internal Medicine

## 2019-10-09 ENCOUNTER — Other Ambulatory Visit: Payer: Self-pay

## 2019-10-09 VITALS — BP 104/68 | HR 81 | Temp 98.2°F | Ht 63.0 in | Wt 123.6 lb

## 2019-10-09 DIAGNOSIS — E039 Hypothyroidism, unspecified: Secondary | ICD-10-CM

## 2019-10-09 LAB — T4, FREE: Free T4: 0.79 ng/dL (ref 0.60–1.60)

## 2019-10-09 LAB — TSH: TSH: 26.21 u[IU]/mL — ABNORMAL HIGH (ref 0.35–4.50)

## 2019-10-09 NOTE — Progress Notes (Signed)
Name: Rachel Alexander  MRN/ DOB: [id number], [birth year]    Age/ Sex: 39 y.o., female    PCP: Burnard Hawthorne, FNP   Reason for Endocrinology Evaluation: Hypothyroidism     Date of Initial Endocrinology Evaluation: 10/09/2019     HPI: [name] is a 39 y.o. female with a past medical history of Anxiety and depression . The patient presented for initial endocrinology clinic visit on 10/09/2019 for consultative assistance with her hypothyroidism.   Pt was diagnosed with hyperthyroidism and goiter at age 57, was put on oral medications ( does not recall ) but somehow she became hypothyroid and was on armour thyroid but recently was switched synthroid  In 08/2019.      Cousins with thyroid disease   Takes it appropriately    She continues with worsening fatigue, sleeps at night .  Denies constipation  Depression stable     Does not take MVI, iron or calcium.  No local neck symptoms   No prior exposure to    HISTORY:  Past Medical History:  Past Medical History:  Diagnosis Date  . Colitis   . Depression   . Frequent headaches   . Hypothyroidism   . Migraines   . Pituitary tumor   . Suicidal intent     Past Surgical History:  Past Surgical History:  Procedure Laterality Date  . COLONOSCOPY N/A 08/24/2019   Procedure: COLONOSCOPY;  Surgeon: Jonathon Bellows, MD;  Location: [facility] ENDOSCOPY;  Service: Gastroenterology;  Laterality: N/A;  . TONSILLECTOMY    . TUBAL LIGATION        Social History:  reports that she quit smoking about 2 years ago. Her smoking use included cigarettes. She smoked 1.00 pack per day. She has never used smokeless tobacco. She reports that she does not drink alcohol or use drugs.  Family History: family history includes Diabetes in her sister; Liver cancer in her father.   HOME MEDICATIONS: Allergies as of 10/09/2019   No Known Allergies     Medication List       Accurate as of October 09, 2019  8:54 AM. If you have any questions,  ask your nurse or doctor.        ALPRAZolam 1 MG tablet Commonly known as: XANAX Take 1 tablet (1 mg total) by mouth 2 (two) times daily as needed for anxiety.   amphetamine-dextroamphetamine 30 MG tablet Commonly known as: ADDERALL Take 30 mg by mouth daily. 1/2 tab Mon-Fri   cholecalciferol 25 MCG (1000 UNIT) tablet Commonly known as: VITAMIN D3 Take 1,000 Units by mouth daily.   citalopram 20 MG tablet Commonly known as: CELEXA Take 1 tablet (20 mg total) by mouth daily.   CRANBERRY PO Take by mouth.   lactulose 20 g packet Commonly known as: CEPHULAC Take 1 packet (20 g total) by mouth 3 (three) times daily.   levothyroxine 150 MCG tablet Commonly known as: SYNTHROID Take 1 tablet (150 mcg total) by mouth daily at 6 (six) AM.   SUMAtriptan 25 MG tablet Commonly known as: Imitrex Take 1 tablet (25 mg total) by mouth once for 1 dose. May repeat ONCE 2 hours later if headache persists or recurs. Max 2 doses.   traZODone 50 MG tablet Commonly known as: DESYREL Take 0.5-1 tablets (25-50 mg total) by mouth at bedtime as needed for sleep.         REVIEW OF SYSTEMS: A comprehensive ROS was conducted with the patient and is negative except as  per HPI and below:  ROS     OBJECTIVE:  VS: BP 104/68 (BP Location: Left Arm, Patient Position: Sitting, Cuff Size: Normal)   Pulse 81   Temp 98.2 F (36.8 C)   Ht 5\' 3"  (1.6 m)   Wt 123 lb 9.6 oz (56.1 kg)   LMP 10/07/2019   SpO2 98%   BMI 21.89 kg/m    Wt Readings from Last 3 Encounters:  10/09/19 123 lb 9.6 oz (56.1 kg)  10/07/19 123 lb 12.8 oz (56.2 kg)  09/26/19 125 lb 6.4 oz (56.9 kg)     EXAM: General: Pt appears well and is in NAD  Neck: General: Supple without adenopathy. Thyroid: Thyroid size normal.  No goiter or nodules appreciated. No thyroid bruit.  Lungs: Clear with good BS bilat with no rales, rhonchi, or wheezes  Heart: Auscultation: RRR.  Abdomen: Normoactive bowel sounds, soft, nontender,  without masses or organomegaly palpable  Extremities:  BL LE: No pretibial edema normal ROM and strength.  Skin: Hair: Texture and amount normal with gender appropriate distribution Skin Inspection: No rashes Skin Palpation: Skin temperature, texture, and thickness normal to palpation  Neuro: Cranial nerves: II - XII grossly intact  Motor: Normal strength throughout DTRs: 2+ and symmetric in UE without delay in relaxation phase  Mental Status: Judgment, insight: Intact Orientation: Oriented  totime, place, and person Mood and affect: No depression, anxiety, or agitation     DATA REVIEWED: Results for DENICIA, MANZER (MRN [id number]) as of 10/10/2019 07:49  Ref. Range 09/26/2019 16:02 10/09/2019 09:24  TSH Latest Ref Range: 0.35 - 4.50 uIU/mL 26.43 (H) 26.21 (H)  T4,Free(Direct) Latest Ref Range: 0.60 - 1.60 ng/dL  0.79    ASSESSMENT/PLAN/RECOMMENDATIONS:   1. Hypothyroidism:   - Pt continues with hypothyroid symptoms  - No local neck symptoms - - Pt educated extensively on the correct way to take levothyroxine (first thing in the morning with water, 30 minutes before eating or taking other medications). - Pt encouraged to double dose the following day if she were to miss a dose given long half-life of levothyroxine. -Her free T4 is on the low end of normal, TSH remains elevated but this is because it lags behind.  I am going to adjust her levothyroxine dose as below. -She is requiring higher doses of L T4 replacement based on her weight, this could be due to absorption issues. -Patient prefers to have labs closer to home  Medications : Synthroid 150 MCG, 2 tablets on Sundays, 1 tablet the rest of the week.   F/U in 3 months  Labs in 6 weeks. Signed electronically by: Mack Guise, MD  [facility]  [facility] [address] [place], [place] [address] Phone: [phone] FAX: [phone]   CC: Burnard Hawthorne,  FNP [address] [place] [address] Phone: [phone] Fax: [phone]   Return to Endocrinology clinic as below: Future Appointments  Date Time Provider [place]  12/26/2019  3:00 PM Burnard Hawthorne, FNP [facility]  02/06/2020  8:00 AM [facility] None  02/10/2020 10:00 AM Earlie Server, MD [facility] None

## 2019-10-09 NOTE — Patient Instructions (Signed)
You are on Synthroid  - which is your thyroid hormone supplement. You MUST take this consistently.  You should take this first thing in the morning on an empty stomach with water. You should not take it with other medications. Wait 30min to 1hr prior to eating. If you are taking any vitamins - please take these in the evening.   If you miss a dose, please take your missed dose the following day (double the dose for that day). You should have a pill box for ONLY Synthroid  on your bedside table to help you remember to take your medications.  

## 2019-10-10 ENCOUNTER — Encounter: Payer: Self-pay | Admitting: Internal Medicine

## 2019-10-10 DIAGNOSIS — E039 Hypothyroidism, unspecified: Secondary | ICD-10-CM | POA: Insufficient documentation

## 2019-10-10 LAB — THYROID PEROXIDASE ANTIBODY: Thyroperoxidase Ab SerPl-aCnc: 2 IU/mL (ref <9)

## 2019-10-10 MED ORDER — LEVOTHYROXINE SODIUM 150 MCG PO TABS: 150.0000 ug | ORAL_TABLET | Freq: Every day | ORAL | 5 refills | Status: DC

## 2019-10-20 ENCOUNTER — Other Ambulatory Visit: Payer: Self-pay | Admitting: Family

## 2019-10-20 ENCOUNTER — Telehealth: Payer: Self-pay | Admitting: Family

## 2019-10-20 DIAGNOSIS — F32A Depression, unspecified: Secondary | ICD-10-CM

## 2019-10-20 DIAGNOSIS — F329 Major depressive disorder, single episode, unspecified: Secondary | ICD-10-CM

## 2019-10-20 DIAGNOSIS — F419 Anxiety disorder, unspecified: Secondary | ICD-10-CM

## 2019-10-20 NOTE — Telephone Encounter (Signed)
Rejection Reason - Patient did not respond - Unable schedule appointment as patient has not responded to scheduling attempts. If patient still wants appointment with endocrinology, they will need a new referral." [facility] said about 2 hours ago

## 2019-10-21 ENCOUNTER — Other Ambulatory Visit: Payer: Self-pay | Admitting: Family

## 2019-10-21 DIAGNOSIS — F419 Anxiety disorder, unspecified: Secondary | ICD-10-CM

## 2019-10-21 DIAGNOSIS — F32A Depression, unspecified: Secondary | ICD-10-CM

## 2019-10-22 ENCOUNTER — Encounter: Payer: Self-pay | Admitting: Family

## 2019-10-28 ENCOUNTER — Telehealth: Payer: Self-pay | Admitting: Pharmacist

## 2019-10-28 NOTE — Telephone Encounter (Signed)
Patient failed to provide requested 2021 financial documentation. No additional medication assistance will be provided by [facility] without the requested proof of income documentation. Patient notified by letter. [place] [facility]

## 2019-10-30 ENCOUNTER — Telehealth: Payer: Self-pay

## 2019-10-30 NOTE — Telephone Encounter (Signed)
FYI- patient was encouraged to get COVID tested.  [place] RECORD [facility] Patient Name: Rachel Alexander Gender: Female DOB: 1981-01-15 Age: 39 Y 2 M 6 D Return Phone Number: [phone] (Primary) Address: City/State/Zip: Rachel Alexander Client [place] Client Site [place] - Day Physician Mable Paris- NP Contact Type Call Who Is Calling Patient / Member / Family / Caregiver Call Type Triage / Clinical Relationship To Patient Self Return Phone Number [phone] (Primary) Chief Complaint Headache Reason for Call Symptomatic / Request for [place] states that she works closely to people at work and someone tested positive for Covid-19. She states that she has a headache and is tired. Translation No Nurse Assessment Nurse: Sharlett Iles, RN, Nehemiah Settle Date/Time (Eastern Time): 10/29/2019 8:38:50 PM Confirm and document reason for call. If symptomatic, describe symptoms. ---Caller was in close contact with someone who tested positive for COVID today. She now has a headache and is fatigued. Has the patient had close contact with a person known or suspected to have the novel coronavirus illness OR traveled / lives in area with major community spread (including international travel) in the last 14 days from the onset of symptoms? * If Asymptomatic, screen for exposure and travel within the last 14 days. ---Yes Does the patient have any new or worsening symptoms? ---Yes Will a triage be completed? ---Yes Related visit to physician within the last 2 weeks? ---No Does the PT have any chronic conditions? (i.e. diabetes, asthma, this includes High risk factors for pregnancy, etc.) ---Yes List chronic conditions. ---Hypothyroidism, low iron and vit D levels Is the patient pregnant or possibly pregnant? (Ask all  females between the ages of 80-55) ---No Is this a behavioral health or substance abuse call? ---No Guidelines Guideline Title Affirmed Question Affirmed Notes Nurse Date/Time (Eastern Time) COVID-19 - Diagnosed or Suspected [1] COVID-19 infection suspected by caller or triager AND [2] mild symptoms (cough, fever, Sharlett Iles, RN, Nehemiah Settle 10/29/2019 8:41:05 PMPLEASE NOTE: All timestamps contained within this report are represented as [place] Standard Time. CONFIDENTIALTY NOTICE: This fax transmission is intended only for the addressee. It contains information that is legally privileged, confidential or otherwise protected from use or disclosure. If you are not the intended recipient, you are strictly prohibited from reviewing, disclosing, copying using or disseminating any of this information or taking any action in reliance on or regarding this information. If you have received this fax in error, please notify us immediately by telephone so that we can arrange for its return to [place]. Phone: [phone], Toll-Free: [phone], Fax: [phone] Page: 2 of 2 Call Id: [id number] Guidelines Guideline Title Affirmed Question Affirmed Notes Nurse Date/Time Eilene Ghazi Time) or others) AND [id number] no complications or SOB Disp. Time Eilene Ghazi Time) Disposition Final User 10/29/2019 8:44:20 PM Call PCP when Office is Open Yes Sharlett Iles, RN, Lewayne Bunting Disagree/Comply Comply Caller Understands Yes PreDisposition [place] Advice Given Per Guideline CALL PCP WHEN OFFICE IS OPEN: * You suspect you have COVID-19 because you have symptoms that match and you were either exposed to someone with it or because it widespread in your community. CALL BACK IF: * Fever over 103 F (39.4 C) * You become worse. 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 Referrals REFERRED  TO PCP OFFICE

## 2019-10-31 NOTE — Telephone Encounter (Signed)
Per triage, pt to comply with covid testing Agree with plan and advice

## 2019-11-04 ENCOUNTER — Telehealth: Payer: Self-pay | Admitting: Family

## 2019-11-04 NOTE — Telephone Encounter (Signed)
Call patient medical records to [place].  These appear to be from her psychiatrist around 2013.   I do not see a formal diagnosis of ADHD including in records. .    Please asked patient if she has been formally diagnosed for ADHD and formally tested for this?    If it is a matter of she cannot find the records of this to ensure we have in our records ,  we can certainly have her retested just that he can have them in our chart.    If she never been diagnosed formally, then please let me know

## 2019-11-04 NOTE — Telephone Encounter (Signed)
Left pt a vm to call ofc to sch diag mammo.

## 2019-11-04 NOTE — Telephone Encounter (Signed)
LMTCB

## 2019-11-13 NOTE — Telephone Encounter (Signed)
I have sent patient mychart message.

## 2019-11-14 ENCOUNTER — Other Ambulatory Visit: Payer: Self-pay | Admitting: Family

## 2019-11-14 ENCOUNTER — Inpatient Hospital Stay
Admission: RE | Admit: 2019-11-14 | Discharge: 2019-11-14 | Disposition: A | Discharge status: Home / Self Care | Payer: Self-pay | Source: Ambulatory Visit | Attending: Family | Admitting: Family

## 2019-11-14 ENCOUNTER — Ambulatory Visit
Admission: RE | Admit: 2019-11-14 | Discharge: 2019-11-14 | Disposition: A | Discharge status: Home / Self Care | Payer: 59 | Source: Ambulatory Visit | Attending: Family | Admitting: Family

## 2019-11-14 ENCOUNTER — Encounter: Payer: Self-pay | Admitting: Obstetrics and Gynecology

## 2019-11-14 DIAGNOSIS — N6310 Unspecified lump in the right breast, unspecified quadrant: Secondary | ICD-10-CM | POA: Diagnosis present

## 2019-11-14 DIAGNOSIS — Z1231 Encounter for screening mammogram for malignant neoplasm of breast: Secondary | ICD-10-CM

## 2019-11-14 DIAGNOSIS — Z0000 Encounter for general adult medical examination without abnormal findings: Secondary | ICD-10-CM

## 2019-11-18 ENCOUNTER — Telehealth: Payer: Self-pay

## 2019-11-18 NOTE — Telephone Encounter (Signed)
LMTCB for results of breast US.

## 2019-11-24 ENCOUNTER — Other Ambulatory Visit: Payer: Self-pay | Admitting: Family

## 2019-11-24 DIAGNOSIS — R519 Headache, unspecified: Secondary | ICD-10-CM

## 2019-11-25 ENCOUNTER — Telehealth: Payer: Self-pay

## 2019-11-25 NOTE — Telephone Encounter (Signed)
-----   Message from Burnard Hawthorne, [place] sent at 11/18/2019  1:24 PM EDT ----- CALL-  Ensure she see my chart result note.  I would like for her to have an appointment so we can discuss breast discharge and if further evaluation is warranted  Let me know if cannot reach patient.

## 2019-11-27 NOTE — Telephone Encounter (Signed)
Rachel Alexander, There has been a lot going on Would you schedule pt for f/u with me?

## 2019-12-01 NOTE — Telephone Encounter (Signed)
noted 

## 2019-12-16 ENCOUNTER — Other Ambulatory Visit: Payer: Self-pay | Admitting: Family

## 2019-12-16 ENCOUNTER — Encounter: Payer: Self-pay | Admitting: Family

## 2019-12-16 DIAGNOSIS — F32A Depression, unspecified: Secondary | ICD-10-CM

## 2019-12-16 DIAGNOSIS — F419 Anxiety disorder, unspecified: Secondary | ICD-10-CM

## 2019-12-16 MED ORDER — ALPRAZOLAM 1 MG PO TABS: 1.0000 mg | ORAL_TABLET | Freq: Two times a day (BID) | ORAL | 1 refills | Status: DC | PRN

## 2019-12-16 NOTE — Telephone Encounter (Signed)
Refill request for xanax, last seen 09-26-19, last filled 10-22-19.  Please advise.

## 2019-12-16 NOTE — Telephone Encounter (Signed)
Pended for your approval or denial.  ° °

## 2019-12-26 ENCOUNTER — Encounter: Payer: Self-pay | Admitting: Family

## 2019-12-26 ENCOUNTER — Other Ambulatory Visit: Payer: Self-pay

## 2019-12-26 ENCOUNTER — Ambulatory Visit (INDEPENDENT_AMBULATORY_CARE_PROVIDER_SITE_OTHER): Payer: 59 | Admitting: Family

## 2019-12-26 VITALS — BP 98/60 | HR 69 | Temp 98.1°F | Ht 62.99 in | Wt 122.4 lb

## 2019-12-26 DIAGNOSIS — F900 Attention-deficit hyperactivity disorder, predominantly inattentive type: Secondary | ICD-10-CM | POA: Diagnosis not present

## 2019-12-26 DIAGNOSIS — F419 Anxiety disorder, unspecified: Secondary | ICD-10-CM

## 2019-12-26 DIAGNOSIS — N6452 Nipple discharge: Secondary | ICD-10-CM | POA: Diagnosis not present

## 2019-12-26 DIAGNOSIS — E039 Hypothyroidism, unspecified: Secondary | ICD-10-CM | POA: Diagnosis not present

## 2019-12-26 DIAGNOSIS — F32A Depression, unspecified: Secondary | ICD-10-CM

## 2019-12-26 DIAGNOSIS — F329 Major depressive disorder, single episode, unspecified: Secondary | ICD-10-CM

## 2019-12-26 MED ORDER — BUPROPION HCL ER (XL) 150 MG PO TB24: ORAL_TABLET | ORAL | 3 refills | Status: DC

## 2019-12-26 MED ORDER — CITALOPRAM HYDROBROMIDE 40 MG PO TABS: 40.0000 mg | ORAL_TABLET | Freq: Every day | ORAL | 1 refills | Status: DC

## 2019-12-26 NOTE — Assessment & Plan Note (Signed)
PHQ 9 worsened; we agreed to trial increase celexa and start wellbutrin. She will let me know how she is doing

## 2019-12-26 NOTE — Assessment & Plan Note (Signed)
Pending lab evaluation and consult with Dr Terri Piedra for second opinion.

## 2019-12-26 NOTE — Patient Instructions (Signed)
Referral to [place], surgery Let us know if you dont hear back within a week in regards to an appointment being scheduled.   Start wellbutrin  Increase celexa

## 2019-12-26 NOTE — Assessment & Plan Note (Signed)
Trial of wellbutrin , close follow up

## 2019-12-26 NOTE — Assessment & Plan Note (Signed)
Pending tsh, she will continue to follow with endocrine

## 2019-12-26 NOTE — Progress Notes (Signed)
Subjective:    Patient ID: Rachel Alexander, female    DOB: August 03, 1980, 39 y.o.   MRN: [id number]  CC: Rachel Alexander is a 39 y.o. female who presents today for follow up.   HPI: Over all feels well today  No new complaints. She feels safe at home and continues to live with spouse and son. She reiterates she is not concerned for her or her son's safely. She is hopeful that he husband will get help for his depression.   Hypothyroidism- compliant with synthroid. Last BM today. Continues to take miralax prn.   ADHD- continues to struggle with concentration. No formal testing paperwork in chart for adhd.  No drug use, alcohol.  No h/o eating disorder, seizure.   Depression-feels well on celexa however has breakthrough anxiety and would like to increase to prior dose of 40mg .  Taking xanax BID with short term relief. No si/hi  Right breast discharge since 2014. In 2015 had biopsy which was normal per patient.  Last episode one month ago. Has noticed in the past when about to start menses, sees yellow fluid, thick ; discharge can be expressed manually. Not spontaneous or bloody.   UTD mammogram  Consult with Dr Kelton Pillar 10/09/19-Synthroid 150 MCG, 2 tablets on Sundays, 1 tablet the rest of the week.  Seeing Dr Tasia Catchings 02/10/20   HISTORY:  Past Medical History:  Diagnosis Date  . Colitis   . Depression   . Frequent headaches   . Hypothyroidism   . Migraines   . Pituitary tumor   . Suicidal intent    Past Surgical History:  Procedure Laterality Date  . BREAST CYST ASPIRATION Right 2012  . COLONOSCOPY N/A 08/24/2019   Procedure: COLONOSCOPY;  Surgeon: Jonathon Bellows, MD;  Location[facility] ENDOSCOPY;  Service: Gastroenterology;  Laterality: N/A;  . TONSILLECTOMY    . TUBAL LIGATION     Family History  Problem Relation Age of Onset  . Liver cancer Father   . Diabetes Sister   . Breast cancer Maternal Aunt     Allergies: Patient has no known allergies. Current Outpatient Medications on  File Prior to Visit  Medication Sig Dispense Refill  . ALPRAZolam (XANAX) 1 MG tablet Take 1 tablet (1 mg total) by mouth 2 (two) times daily as needed for anxiety. 60 tablet 1  . cholecalciferol (VITAMIN D3) 25 MCG (1000 UNIT) tablet Take 1,000 Units by mouth daily.    Marland Kitchen CRANBERRY PO Take by mouth.    . lactulose (CEPHULAC) 20 g packet Take 1 packet (20 g total) by mouth 3 (three) times daily. 30 each 0  . levothyroxine (SYNTHROID) 150 MCG tablet Take 1 tablet (150 mcg total) by mouth daily at 6 (six) AM. 2 tablets on Sundays, 1 tablet the rest of the week 35 tablet 5  . traZODone (DESYREL) 50 MG tablet Take 0.5-1 tablets (25-50 mg total) by mouth at bedtime as needed for sleep. 30 tablet 3  . SUMAtriptan (IMITREX) 25 MG tablet TAKE 1 TABLET (25 MG TOTAL) BY MOUTH ONCE FOR 1 DOSE. MAY REPEAT ONCE 2 HOURS LATER IF HEADACHE PERSISTS OR RECURS. MAX 2 DOSES. 9 tablet 2  . [DISCONTINUED] dicyclomine (BENTYL) 20 MG tablet Take 1 tablet (20 mg total) by mouth 2 (two) times daily. 20 tablet 0   No current facility-administered medications on file prior to visit.    Social History   Tobacco Use  . Smoking status: Former Smoker    Packs/day: 1.00    Types: Cigarettes  Quit date: 10/06/2017    Years since quitting: 2.2  . Smokeless tobacco: Never Used  Vaping Use  . Vaping Use: Every day  Substance Use Topics  . Alcohol use: No  . Drug use: No    Review of Systems  Constitutional: Negative for chills and fever.  Respiratory: Negative for cough.   Cardiovascular: Negative for chest pain and palpitations.  Gastrointestinal: Negative for constipation (resolved), nausea and vomiting.  Psychiatric/Behavioral: Positive for decreased concentration. Negative for suicidal ideas.      Objective:    BP 98/60 (BP Location: Right Arm, Patient Position: Sitting)   Pulse 69   Temp 98.1 F (36.7 C)   Ht 5' 2.99" (1.6 m)   Wt 122 lb 6.4 oz (55.5 kg)   SpO2 99%   BMI 21.69 kg/m  BP Readings  from Last 3 Encounters:  12/26/19 98/60  10/09/19 104/68  10/07/19 112/81   Wt Readings from Last 3 Encounters:  12/26/19 122 lb 6.4 oz (55.5 kg)  10/09/19 123 lb 9.6 oz (56.1 kg)  10/07/19 123 lb 12.8 oz (56.2 kg)   Depression screen [facility] 2/9 12/26/2019 09/26/2019 08/25/2019 07/28/2019 03/11/2019  Decreased Interest 1 1 3  0 0  Down, Depressed, Hopeless 1 0 1 0 0  PHQ - 2 Score 2 1 4  0 0  Altered sleeping 3 2 3  - 0  Tired, decreased energy 3 3 3  - 0  Change in appetite 1 3 3  - 0  Feeling bad or failure about yourself  2 0 1 - 0  Trouble concentrating 3 1 3  - 0  Moving slowly or fidgety/restless 3 2 3  - 0  Suicidal thoughts 0 0 0 - 0  PHQ-9 Score 17 12 20  - 0  Difficult doing work/chores Extremely dIfficult Somewhat difficult Extremely dIfficult - Not difficult at all    Physical Exam Vitals reviewed.  Constitutional:      Appearance: She is well-developed.  Eyes:     Conjunctiva/sclera: Conjunctivae normal.  Cardiovascular:     Rate and Rhythm: Normal rate and regular rhythm.     Pulses: Normal pulses.     Heart sounds: Normal heart sounds.  Pulmonary:     Effort: Pulmonary effort is normal.     Breath sounds: Normal breath sounds. No wheezing, rhonchi or rales.  Chest:     Breasts:        Right: Mass present.        Comments: Pea sized non tender palpable mass. No discharge from nipple. No erythema, increased warmth. Skin:    General: Skin is warm and dry.  Neurological:     Mental Status: She is alert.  Psychiatric:        Speech: Speech normal.        Behavior: Behavior normal.        Thought Content: Thought content normal.        Assessment & Plan:   Problem List Items Addressed This Visit      Endocrine   Acquired hypothyroidism - Primary    Pending tsh, she will continue to follow with endocrine      Relevant Orders   TSH     Other   Anxiety and depression    PHQ 9 worsened; we agreed to trial increase celexa and start wellbutrin. She will let me  know how she is doing      Relevant Medications   citalopram (CELEXA) 40 MG tablet   buPROPion (WELLBUTRIN XL) 150 MG 24 hr  tablet   Attention deficit hyperactivity disorder, predominantly inattentive type    Trial of wellbutrin , close follow up      Discharge from right nipple    Pending lab evaluation and consult with Dr Terri Piedra for second opinion.      Relevant Orders   Prolactin   CBC with Differential/Platelet   Ambulatory referral to General Surgery       I have discontinued Rachel Weldon "[name]"'s amphetamine-dextroamphetamine and venlafaxine XR. I have also changed her citalopram. Additionally, I am having her start on buPROPion. Lastly, I am having her maintain her cholecalciferol, traZODone, lactulose, CRANBERRY PO, levothyroxine, SUMAtriptan, and ALPRAZolam.   Meds ordered this encounter  Medications  . citalopram (CELEXA) 40 MG tablet    Sig: Take 1 tablet (40 mg total) by mouth daily.    Dispense:  90 tablet    Refill:  1    Order Specific Question:   Supervising Provider    Answer:   Deborra Medina L [[id number]]  . buPROPion (WELLBUTRIN XL) 150 MG 24 hr tablet    Sig: Start 150 mg ER PO qam, increase after 3 days to 300 mg qam.    Dispense:  60 tablet    Refill:  3    Order Specific Question:   Supervising Provider    Answer:   Crecencio Mc [[id number]]    Return precautions given.   Risks, benefits, and alternatives of the medications and treatment plan prescribed today were discussed, and patient expressed understanding.   Education regarding symptom management and diagnosis given to patient on AVS.  Continue to follow with Burnard Hawthorne, FNP for routine health maintenance.   Rachel Alexander and I agreed with plan.   Mable Paris, FNP

## 2019-12-27 LAB — CBC WITH DIFFERENTIAL/PLATELET
Absolute Monocytes: 542 cells/uL (ref 200–950)
Basophils Absolute: 0 cells/uL (ref 0–200)
Basophils Relative: 0 %
Eosinophils Absolute: 9 cells/uL — ABNORMAL LOW (ref 15–500)
Eosinophils Relative: 0.2 %
HCT: 36.2 % (ref 35.0–45.0)
Hemoglobin: 12.3 g/dL (ref 11.7–15.5)
Lymphs Abs: 1707 cells/uL (ref 850–3900)
MCH: 32.9 pg (ref 27.0–33.0)
MCHC: 34 g/dL (ref 32.0–36.0)
MCV: 96.8 fL (ref 80.0–100.0)
MPV: 11 fL (ref 7.5–12.5)
Monocytes Relative: 12.6 %
Neutro Abs: 2043 cells/uL (ref 1500–7800)
Neutrophils Relative %: 47.5 %
Platelets: 194 10*3/uL (ref 140–400)
RBC: 3.74 10*6/uL — ABNORMAL LOW (ref 3.80–5.10)
RDW: 11.6 % (ref 11.0–15.0)
Total Lymphocyte: 39.7 %
WBC: 4.3 10*3/uL (ref 3.8–10.8)

## 2019-12-27 LAB — PROLACTIN: Prolactin: 2.1 ng/mL

## 2019-12-27 LAB — TSH: TSH: 3.49 mIU/L

## 2020-01-08 ENCOUNTER — Other Ambulatory Visit: Payer: Self-pay | Admitting: Family

## 2020-01-08 DIAGNOSIS — F32A Depression, unspecified: Secondary | ICD-10-CM

## 2020-01-09 ENCOUNTER — Other Ambulatory Visit: Payer: Self-pay

## 2020-01-09 ENCOUNTER — Encounter: Payer: Self-pay | Admitting: Family

## 2020-01-09 ENCOUNTER — Ambulatory Visit (INDEPENDENT_AMBULATORY_CARE_PROVIDER_SITE_OTHER): Payer: 59 | Admitting: Internal Medicine

## 2020-01-09 VITALS — BP 98/58 | HR 78 | Ht 63.0 in | Wt 124.2 lb

## 2020-01-09 DIAGNOSIS — E039 Hypothyroidism, unspecified: Secondary | ICD-10-CM

## 2020-01-09 NOTE — Progress Notes (Signed)
Name: Rachel Alexander  MRN/ DOB: [id number], 07-Apr-1981    Age/ Sex: 39 y.o., female     PCP: Burnard Hawthorne, FNP   Reason for Endocrinology Evaluation: Hypothyroidism     Initial Endocrinology Clinic Visit: 10/09/2019    PATIENT IDENTIFIER: Rachel Alexander is a 39 y.o., female with a past medical history of Depression, anxiety and hypothyroidism.  Rachel Alexander has followed with [facility] since 10/09/2019 for consultative assistance with management of her hypothyroidism  HISTORICAL SUMMARY:  Rachel Alexander was diagnosed with hyperthyroidism and goiter at age 32, was put on oral medications ( does not recall ) but over the years  Rachel Alexander became hypothyroid requiring LT-4 replacement. Rachel Alexander was initially on armour thyroid but was switched synthroid  In 08/2019.      Cousins with thyroid disease   SUBJECTIVE:    Today (01/12/2020):  Rachel Alexander is here for a follow up on hypothyroidism     Weight has been stable  Has constipation  Energy level has improved  Sleep well at night   No local neck symptoms   Synthroid 150 mcg , 2 tabs on Sunday and 1 tab rest of the week    ROS:  As per HPI.   HISTORY:  Past Medical History:  Past Medical History:  Diagnosis Date  . Colitis   . Depression   . Frequent headaches   . Hypothyroidism   . Migraines   . Pituitary tumor   . Suicidal intent    Past Surgical History:  Past Surgical History:  Procedure Laterality Date  . BREAST CYST ASPIRATION Right 2012  . COLONOSCOPY N/A 08/24/2019   Procedure: COLONOSCOPY;  Surgeon: Jonathon Bellows, MD;  Location: [facility] ENDOSCOPY;  Service: Gastroenterology;  Laterality: N/A;  . TONSILLECTOMY    . TUBAL LIGATION      Social History:  reports that Rachel Alexander quit smoking about 2 years ago. Her smoking use included cigarettes. Rachel Alexander smoked 1.00 pack per day. Rachel Alexander has never used smokeless tobacco. Rachel Alexander reports that Rachel Alexander does not drink alcohol and does not use drugs. Family History:  Family History   Problem Relation Age of Onset  . Liver cancer Father   . Diabetes Sister   . Breast cancer Maternal Aunt      HOME MEDICATIONS: Allergies as of 01/09/2020   No Known Allergies     Medication List       Accurate as of January 09, 2020 11:59 PM. If you have any questions, ask your nurse or doctor.        ALPRAZolam 1 MG tablet Commonly known as: XANAX Take 1 tablet (1 mg total) by mouth 2 (two) times daily as needed for anxiety.   buPROPion 150 MG 24 hr tablet Commonly known as: WELLBUTRIN XL Start 150 mg ER PO qam, increase after 3 days to 300 mg qam.   cholecalciferol 25 MCG (1000 UNIT) tablet Commonly known as: VITAMIN D3 Take 1,000 Units by mouth daily.   citalopram 40 MG tablet Commonly known as: CELEXA Take 1 tablet (40 mg total) by mouth daily.   CRANBERRY PO Take by mouth.   lactulose 20 g packet Commonly known as: CEPHULAC Take 1 packet (20 g total) by mouth 3 (three) times daily.   levothyroxine 150 MCG tablet Commonly known as: SYNTHROID Take 1 tablet (150 mcg total) by mouth daily at 6 (six) AM. 2 tablets on Sundays, 1 tablet the rest of the week   SUMAtriptan 25 MG tablet Commonly known as: IMITREX  TAKE 1 TABLET (25 MG TOTAL) BY MOUTH ONCE FOR 1 DOSE. MAY REPEAT ONCE 2 HOURS LATER IF HEADACHE PERSISTS OR RECURS. MAX 2 DOSES.   traZODone 50 MG tablet Commonly known as: DESYREL TAKE 0.5-1 TABLETS (25-50 MG TOTAL) BY MOUTH AT BEDTIME AS NEEDED FOR SLEEP.         OBJECTIVE:   PHYSICAL EXAM: VS: BP (!) 98/58 (BP Location: Left Arm, Patient Position: Sitting, Cuff Size: Normal)   Pulse 78   Ht 5\' 3"  (1.6 m)   Wt 124 lb 3.2 oz (56.3 kg)   SpO2 98%   BMI 22.00 kg/m    EXAM: General: Rachel Alexander appears well and is in NAD  Neck: General: Supple without adenopathy. Thyroid: Thyroid size normal.  No goiter or nodules appreciated. No thyroid bruit.  Lungs: Clear with good BS bilat with no rales, rhonchi, or wheezes  Heart: Auscultation: RRR.  Abdomen:  Normoactive bowel sounds, soft, nontender, without masses or organomegaly palpable  Extremities:  BL LE: No pretibial edema normal ROM and strength.  Mental Status: Judgment, insight: Intact Orientation: Oriented to time, place, and person Mood and affect: No depression, anxiety, or agitation     DATA REVIEWED: Results for ENJOLI, TIDD (MRN [id number]) as of 01/12/2020 12:27  Ref. Range 12/26/2019 15:55  TSH Latest Units: mIU/L 3.49     ASSESSMENT / PLAN / RECOMMENDATIONS:   1. Hypothyroidism:    - Rachel Alexander is clinically and biochemically euthyroid  - Rachel Alexander educated extensively on the correct way to take levothyroxine (first thing in the morning with water, 30 minutes before eating or taking other medications). - Rachel Alexander encouraged to double dose the following day if Rachel Alexander were to miss a dose given long half-life of levothyroxine.     Medications  Levothyroxine 150 mcg , 2 tabs on Sundays and 1 tab rest of the week      Labs in 8 weeks  F/U in 6 months  Signed electronically by: Mack Guise, MD  [facility]  [facility] [address] [place], [place] [address] Phone: [phone] FAX: [phone]      CC: Burnard Hawthorne, FNP [address] [place] [address] Phone: [phone]  Fax: [phone]   Return to Endocrinology clinic as below: Future Appointments  Date Time Provider [place]  02/06/2020  8:00 AM [facility] None  02/10/2020 10:00 AM Earlie Server, MD [facility] None  02/27/2020  4:00 PM Burnard Hawthorne, FNP [facility]  07/12/2020  7:30 AM Makynzi Eastland, Melanie Crazier, MD [facility] None

## 2020-01-09 NOTE — Patient Instructions (Signed)
-   Continue Levothyroxine 150 mcg, 2 tablets on Sundays and 1 tablet daily the rest of the week  - Vitamin D3 1000 iu daily     You are on levothyroxine - which is your thyroid hormone supplement. You MUST take this consistently.  You should take this first thing in the morning on an empty stomach with water. You should not take it with other medications. Wait 59min to 1hr prior to eating. If you are taking any vitamins - please take these in the evening.   If you miss a dose, please take your missed dose the following day (double the dose for that day). You should have a pill box for ONLY levothyroxine on your bedside table to help you remember to take your medications.

## 2020-01-11 ENCOUNTER — Other Ambulatory Visit: Payer: Self-pay | Admitting: Family

## 2020-01-11 DIAGNOSIS — F32A Depression, unspecified: Secondary | ICD-10-CM

## 2020-01-17 ENCOUNTER — Other Ambulatory Visit: Payer: Self-pay | Admitting: Family

## 2020-01-17 DIAGNOSIS — F32A Depression, unspecified: Secondary | ICD-10-CM

## 2020-01-20 ENCOUNTER — Telehealth: Payer: Self-pay | Admitting: Family

## 2020-01-20 ENCOUNTER — Other Ambulatory Visit: Payer: Self-pay | Admitting: Family

## 2020-01-20 NOTE — Telephone Encounter (Signed)
I looked up patient on [place] Controlled Substances Reporting System PMP AWARE and saw no activity that raised concern of inappropriate use.   

## 2020-01-21 ENCOUNTER — Other Ambulatory Visit: Payer: Self-pay | Admitting: Family

## 2020-01-21 MED ORDER — AMPHETAMINE-DEXTROAMPHETAMINE 5 MG PO TABS: 5.0000 mg | ORAL_TABLET | Freq: Two times a day (BID) | ORAL | 0 refills | Status: DC | PRN

## 2020-01-21 MED ORDER — AMPHETAMINE-DEXTROAMPHETAMINE 5 MG PO TABS
5.0000 mg | ORAL_TABLET | Freq: Two times a day (BID) | ORAL | 0 refills | Status: DC | PRN
Start: 2020-01-21 — End: 2020-01-21

## 2020-01-21 NOTE — Progress Notes (Signed)
I looked up patient on [place] Controlled Substances Reporting System PMP AWARE and saw no activity that raised concern of inappropriate use.   

## 2020-01-27 ENCOUNTER — Encounter: Payer: Self-pay | Admitting: Family

## 2020-01-28 ENCOUNTER — Other Ambulatory Visit: Payer: Self-pay

## 2020-01-28 ENCOUNTER — Ambulatory Visit
Admission: EM | Admit: 2020-01-28 | Discharge: 2020-01-28 | Disposition: A | Discharge status: Home / Self Care | Payer: 59 | Attending: Emergency Medicine | Admitting: Emergency Medicine

## 2020-01-28 DIAGNOSIS — B349 Viral infection, unspecified: Secondary | ICD-10-CM | POA: Diagnosis not present

## 2020-01-28 NOTE — Telephone Encounter (Signed)
Pt c/o facial pain, eye swelling, dizziness, Fatigue, Nausea. Rachel Alexander is on her way to the [facility] urgent care to be evaluated at time of call. She will call us back after her visit to schedule a video visit follow up with PCP.

## 2020-01-28 NOTE — ED Triage Notes (Signed)
Patient reports nasal congestion, fatigue, chills, sore throat, bilateral ear pain, dizziness, and fatigue x2 days. Denies cough, fever. Reports DOE.

## 2020-01-28 NOTE — Discharge Instructions (Signed)
Take Tylenol or ibuprofen as needed for fever or discomfort.  Take Mucinex as needed for congestion.    Your COVID test is pending.  You should self quarantine until the test result is back.    Go to the emergency department if you develop acute worsening symptoms.

## 2020-01-28 NOTE — Telephone Encounter (Signed)
Pt c/o facial pain, eye swelling, dizziness, Fatigue, Nausea. [name] Is on her way to the [facility] urgent care to be evaluated. She will call us back after her visit to schedule a video visit follow up with PCP.

## 2020-01-28 NOTE — ED Provider Notes (Signed)
Roderic Palau    CSN: [id number] Arrival date & time: 01/28/20  4010      History   Chief Complaint Chief Complaint  Patient presents with  . Nasal Congestion  . Weakness  . Dizziness  . Chills  . Fatigue    HPI Rachel Alexander is a 39 y.o. female.   Patient presents with fatigue, chills, dizziness, nasal congestion, ear pain, sore throat, nausea, and shortness of breath with exertion x 2 days.  She states she felt feverish last night but did not take her temperature.  She denies rash, vomiting, diarrhea, focal weakness, chest pain, abdominal pain, or other symptoms.  Treatment attempted at home with Tylenol.  Patient is not vaccinated for COVID.   The history is provided by the patient.    Past Medical History:  Diagnosis Date  . Colitis   . Depression   . Frequent headaches   . Hypothyroidism   . Migraines   . Pituitary tumor   . Suicidal intent     Patient Active Problem List   Diagnosis Date Noted  . Discharge from right nipple 12/26/2019  . Acquired hypothyroidism 10/10/2019  . Frequent headaches 07/28/2019  . Routine physical examination 04/18/2019  . Anxiety and depression 09/04/2018  . Cigarette smoker 02/15/2017  . 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 Constipation 07/23/1998    Past Surgical History:  Procedure Laterality Date  . BREAST CYST ASPIRATION Right 2012  . COLONOSCOPY N/A 08/24/2019   Procedure: COLONOSCOPY;  Surgeon: Jonathon Bellows, MD;  Location: [facility] ENDOSCOPY;  Service: Gastroenterology;  Laterality: N/A;  . TONSILLECTOMY    . TUBAL LIGATION      OB History   No obstetric history on file.      Home Medications    Prior to Admission medications   Medication Sig Start Date End Date Taking? Authorizing Provider  ALPRAZolam Duanne Moron) 1 MG tablet Take 1 tablet (1 mg total) by mouth 2 (two) times daily as needed for anxiety. 12/16/19   Burnard Hawthorne, FNP  amphetamine-dextroamphetamine (ADDERALL) 5 MG tablet Take 1 tablet (5 mg total) by mouth 2 (two) times daily as needed. 01/21/20   Burnard Hawthorne, FNP  buPROPion (WELLBUTRIN XL) 150 MG 24 hr tablet START WITH 1 TAB DAILY EVERY AM, INCREASE AFTER 3 DAYS TO 2 TABS EVERY MORNING 01/19/20   Burnard Hawthorne, FNP  cholecalciferol (VITAMIN D3) 25 MCG (1000 UNIT) tablet Take 1,000 Units by mouth daily.    [provider]  citalopram (CELEXA) 40 MG tablet Take 1 tablet (40 mg total) by mouth daily. 12/26/19   Burnard Hawthorne, FNP  CRANBERRY PO Take by mouth.    [provider]  lactulose (CEPHULAC) 20 g packet Take 1 packet (20 g total) by mouth 3 (three) times daily. 09/03/19   Jonathon Bellows, MD  levothyroxine (SYNTHROID) 150 MCG tablet Take 1 tablet (150 mcg total) by mouth daily at 6 (six) AM. 2 tablets on Sundays, 1 tablet the rest of the week 10/10/19   Shamleffer, Melanie Crazier, MD  SUMAtriptan (IMITREX) 25 MG tablet TAKE 1 TABLET (25 MG TOTAL) BY MOUTH ONCE FOR 1 DOSE. MAY REPEAT ONCE 2 HOURS LATER IF HEADACHE PERSISTS OR RECURS. MAX 2 DOSES. 11/24/19 11/24/19  Burnard Hawthorne, FNP  traZODone (DESYREL) 50 MG tablet TAKE 0.5-1 TABLETS (25-50 MG TOTAL) BY MOUTH AT BEDTIME AS NEEDED FOR SLEEP. 01/08/20   Burnard Hawthorne, FNP  dicyclomine (BENTYL) 20 MG tablet Take 1 tablet (20 mg total) by mouth 2 (two) times daily. 08/06/14 09/25/14  Antonietta Breach, PA-C    Family History Family History  Problem Relation Age of Onset  . Liver cancer Father   . Diabetes Sister   . Breast cancer Maternal Aunt     Social History Social History   Tobacco Use  . Smoking status: Former Smoker    Packs/day: 1.00    Types: Cigarettes    Quit date: 10/06/2017    Years since quitting: 2.3  . Smokeless tobacco: Never Used  Vaping Use  . Vaping Use: Every day  Substance Use Topics  . Alcohol use: No  . Drug use: No     Allergies   Patient has no known allergies.   Review of  Systems Review of Systems  Constitutional: Positive for chills and fatigue. Negative for fever.  HENT: Positive for congestion, ear pain and sore throat.   Eyes: Negative for pain and visual disturbance.  Respiratory: Negative for cough and shortness of breath.   Cardiovascular: Negative for chest pain and palpitations.  Gastrointestinal: Positive for nausea. Negative for abdominal pain, diarrhea and vomiting.  Genitourinary: Negative for dysuria and hematuria.  Musculoskeletal: Negative for arthralgias and back pain.  Skin: Negative for color change and rash.  Neurological: Negative for seizures and syncope.  All other systems reviewed and are negative.    Physical Exam Triage Vital Signs ED Triage Vitals  Enc Vitals Group     BP      Pulse      Resp      Temp      Temp src      SpO2      Weight      Height      Head Circumference      Peak Flow      Pain Score      Pain Loc      Pain Edu?      Excl. in [place]?    No data found.  Updated Vital Signs BP 106/72   Pulse 74   Temp 99.1 F (37.3 C)   Resp 18   LMP 01/23/2020 (Exact Date)   SpO2 97%   Visual Acuity Right Eye Distance:   Left Eye Distance:   Bilateral Distance:    Right Eye Near:   Left Eye Near:    Bilateral Near:     Physical Exam Vitals and nursing note reviewed.  Constitutional:      General: She is not in acute distress.    Appearance: She is well-developed. She is not ill-appearing.  HENT:     Head: Normocephalic and atraumatic.     Right Ear: Tympanic membrane normal.     Left Ear: Tympanic membrane normal.     Nose: Congestion present.     Mouth/Throat:     Mouth: Mucous membranes are moist.     Pharynx: Oropharynx is clear.  Eyes:     Conjunctiva/sclera: Conjunctivae normal.  Cardiovascular:     Rate and Rhythm: Normal rate and regular rhythm.     Heart sounds: No murmur heard.   Pulmonary:     Effort: Pulmonary effort is normal. No respiratory distress.     Breath sounds:  Normal breath sounds.  Abdominal:     General: Bowel sounds are normal.     Palpations: Abdomen is soft.     Tenderness: There is no abdominal tenderness. There is no guarding or  rebound.  Musculoskeletal:     Cervical back: Neck supple.  Skin:    General: Skin is warm and dry.     Findings: No rash.  Neurological:     General: No focal deficit present.     Mental Status: She is alert and oriented to person, place, and time.     Gait: Gait normal.  Psychiatric:        Mood and Affect: Mood normal.        Behavior: Behavior normal.      UC Treatments / Results  Labs (all labs ordered are listed, but only abnormal results are displayed) Labs Reviewed  NOVEL CORONAVIRUS, NAA    EKG   Radiology No results found.  Procedures Procedures (including critical care time)  Medications Ordered in UC Medications - No data to display  Initial Impression / Assessment and Plan / UC Course  I have reviewed the triage vital signs and the nursing notes.  Pertinent labs & imaging results that were available during my care of the patient were reviewed by me and considered in my medical decision making (see chart for details).   Viral illness.  Discussed symptomatic treatment.  PCR COVID pending.  Instructed patient to self quarantine until the test result is back.  Instructed her to go to the ED if she has acute worsening symptoms.  Discussed that she should follow-up with her PCP if her symptoms are not improving.  Patient agrees to plan of care.     Final Clinical Impressions(s) / UC Diagnoses   Final diagnoses:  Viral illness     Discharge Instructions     Take Tylenol or ibuprofen as needed for fever or discomfort.  Take Mucinex as needed for congestion.    Your COVID test is pending.  You should self quarantine until the test result is back.    Go to the emergency department if you develop acute worsening symptoms.        ED Prescriptions    None     PDMP not  reviewed this encounter.   Sharion Balloon, NP 01/28/20 [phone]

## 2020-01-30 LAB — SARS-COV-2, NAA 2 DAY TAT

## 2020-01-30 LAB — NOVEL CORONAVIRUS, NAA: SARS-CoV-2, NAA: NOT DETECTED

## 2020-02-05 ENCOUNTER — Encounter: Payer: Self-pay | Admitting: Family

## 2020-02-06 ENCOUNTER — Inpatient Hospital Stay: Payer: 59 | Attending: Oncology

## 2020-02-09 ENCOUNTER — Other Ambulatory Visit: Payer: Self-pay | Admitting: Family

## 2020-02-09 DIAGNOSIS — F32A Depression, unspecified: Secondary | ICD-10-CM

## 2020-02-09 DIAGNOSIS — F419 Anxiety disorder, unspecified: Secondary | ICD-10-CM

## 2020-02-10 ENCOUNTER — Inpatient Hospital Stay: Payer: 59 | Admitting: Oncology

## 2020-02-26 ENCOUNTER — Inpatient Hospital Stay: Payer: 59 | Attending: Oncology | Admitting: Oncology

## 2020-02-27 ENCOUNTER — Ambulatory Visit: Payer: 59 | Admitting: Family

## 2020-02-27 DIAGNOSIS — Z0289 Encounter for other administrative examinations: Secondary | ICD-10-CM

## 2020-03-02 ENCOUNTER — Telehealth: Payer: Self-pay | Admitting: Family

## 2020-03-02 ENCOUNTER — Other Ambulatory Visit: Payer: Self-pay | Admitting: Family

## 2020-03-02 DIAGNOSIS — F900 Attention-deficit hyperactivity disorder, predominantly inattentive type: Secondary | ICD-10-CM

## 2020-03-02 NOTE — Telephone Encounter (Signed)
Call pt  We got refill req for adderall.    She missed her last appt. Please sch for next month and advise that she has to be seen EVERY 3 months for controlled substances  There is rx for adderall at her pharmacy; she needs to call pharmacy first  Please ask patient if she can provide formal records of ADHD diagnosis. Where was she diagnosed?   They are not in chart. That reason I made referral in April to be retested. She will need to be retested if no formal testing.   Also, plese ensure we have [place] for patient. I have ordered urine drug screen which is annual. Please sch as well.

## 2020-03-02 NOTE — Telephone Encounter (Signed)
I called patient & LMTCB. Patient should contact pharmacy for refill. They should have script on hold.

## 2020-03-03 NOTE — Telephone Encounter (Signed)
I have sent mychart message since I can see that she does check.

## 2020-03-04 ENCOUNTER — Telehealth: Payer: Self-pay

## 2020-03-04 NOTE — Telephone Encounter (Signed)
I have called [place] clinic hotline. I LM & explained patient was awaiting results, but met criteria plus was symptomatic.

## 2020-03-04 NOTE — Telephone Encounter (Signed)
I called patient based on mychart message to triage. I advised in message left fer her she may need to go to [facility] or ED. Ws not sure what her symptoms were. Will also sent mychart.

## 2020-03-04 NOTE — Telephone Encounter (Signed)
Rachel Alexander, not in office today but certainly didn't want to delay patient care.  Can you advise in regards to monoclonal antibodies infusion versus injection for this patient ?  I haven't gotten clear guidance on injection but certainly want to offer to patient if she meets criteria while we await covid results

## 2020-03-04 NOTE — Telephone Encounter (Signed)
Patient returned my call & stated that she had went to a festival in [place] this weekend. Several of her coworkers tested positive, so she she is pretty she will be positive as well. She is scheduled tomorrow to be tested, but I advised that she go to the [facility] in [place] today to be tested through the health dept.  Patient stated that she will go there today. She is having coughing, fever only high at night, body aches & fatigue. I confirmed no SOB. I advised that over the weekend if she experienced worsening symptoms to please go to UC or ED patient was agreeable. I was not sure if I should call for MAI or injection? She hasn't tested positive, but does have symptoms. She also is a smoker as well. Should I refer for MAI even fore a positive covid or wait until Monday since she has a VV with you & hopefully will have results back. Symptoms also just started today.

## 2020-03-05 ENCOUNTER — Other Ambulatory Visit: Payer: 59

## 2020-03-06 ENCOUNTER — Telehealth: Payer: Self-pay | Admitting: Family

## 2020-03-06 ENCOUNTER — Other Ambulatory Visit: Payer: Self-pay | Admitting: Family

## 2020-03-06 DIAGNOSIS — U071 COVID-19: Secondary | ICD-10-CM

## 2020-03-06 DIAGNOSIS — F17210 Nicotine dependence, cigarettes, uncomplicated: Secondary | ICD-10-CM

## 2020-03-06 NOTE — Telephone Encounter (Signed)
Called to Discuss with patient about Covid symptoms and the use of the monoclonal antibody infusion for those with mild to moderate Covid symptoms and at a high risk of hospitalization.     Pt appears to qualify for this infusion due to co-morbid conditions and/or a member of an at-risk group in accordance with the FDA Emergency Use Authorization.   Rachel Alexander called her primary care office after attending a festival in [place] where several co-workers tested positive. She was experiencing cough, fever, body aches and fatigue. Qualifying risk factor includes tobacco use.  I was unable to reach Rachel Alexander via telephone.  Generic voicemail was left.  Additional information will be sent through [place] and text with contact information included.  Terri Piedra, NP 03/06/2020 11:06 AM

## 2020-03-06 NOTE — Telephone Encounter (Signed)
Called to Discuss with patient about Covid symptoms and the use of the monoclonal antibody infusion for those with mild to moderate Covid symptoms and at a high risk of hospitalization.     Pt appears to qualify for this infusion due to co-morbid conditions and/or a member of an at-risk group in accordance with the FDA Emergency Use Authorization.   Ms. Regula had symptoms starting on 03/03/20 and currently  Include fever, body aches, and cough. Qualifying risk factor include tobacco use.  We discussed the risks and benefits of treatment with Regeneron and she is interested in proceeding with treatment.   [place],   You have been scheduled to receive Regeneron (the monoclonal antibody we discussed) on : 03/07/20 at 11:30 am  If you have been tested outside of a [facility] - you MUST bring a copy of your positive test with you the morning of your appointment. You may take a photo of this and upload to your MyChart portal or have the testing facility fax the result to [phone]    The address for the infusion clinic site is:  --GPS address is [address] - the parking is located near [employer] building where you will see  COVID19 Infusion feather banner marking the entrance to parking.   (see photos below)            --Enter into the 2nd entrance where the "wave, flag banner" is at the road. Turn into this 2nd entrance and immediately turn left to park in 1 of the 5 parking spots.   --Please stay in your car and call the desk for assistance inside [phone].   --Average time in department is roughly 2 hours for Regeneron treatment - this includes preparation of the medication, IV start and the required 1 hour monitoring after the infusion.    Should you develop worsening shortness of breath, chest pain or severe breathing problems please do not wait for this appointment and go to the Emergency room for evaluation and treatment. You will undergo  another oxygen screen before your infusion to ensure this is the best treatment option for you. There is a chance that the best decision may be to send you to the Emergency Room for evaluation at the time of your appointment.   The day of your visit you should: Mar[place] Kitchen Get plenty of rest the night before and drink plenty of water . Eat a light meal/snack before coming and take your medications as prescribed  . Wear warm, comfortable clothes with a shirt that can roll-up over the elbow (will need IV start).  . Wear a mask  . Consider bringing some activity to help pass the time  Many commercial insurers are waiving bills related to [place] treatment however some have ranged from $300-640. We are starting to see some insurers send bills to patients later for the administration of the medication - we are learning more information but you may receive a bill after your appointment.  Please contact your insurance agent to discuss prior to your appointment if you would like further details about billing specific to your policy.    The CPT code is [phone] for your reference.   Terri Piedra, NP 03/06/2020  11:40 AM

## 2020-03-06 NOTE — Progress Notes (Signed)
I connected by phone with Rachel Alexander on 03/06/2020 at 11:41 AM to discuss the potential use of a new treatment for mild to moderate COVID-19 viral infection in non-hospitalized patients.  This patient is a 39 y.o. female that meets the FDA criteria for Emergency Use Authorization of COVID monoclonal antibody casirivimab/imdevimab.  Has a (+) direct SARS-CoV-2 viral test result  Has mild or moderate COVID-19   Is NOT hospitalized due to COVID-19  Is within 10 days of symptom onset  Has at least one of the high risk factor(s) for progression to severe COVID-19 and/or hospitalization as defined in EUA.  Specific high risk criteria : Other high risk medical condition per CDC:  Tobacco use; unvaccinated   I have spoken and communicated the following to the patient or parent/caregiver regarding COVID monoclonal antibody treatment:  1. FDA has authorized the emergency use for the treatment of mild to moderate COVID-19 in adults and pediatric patients with positive results of direct SARS-CoV-2 viral testing who are 38 years of age and older weighing at least 40 kg, and who are at high risk for progressing to severe COVID-19 and/or hospitalization.  2. The significant known and potential risks and benefits of COVID monoclonal antibody, and the extent to which such potential risks and benefits are unknown.  3. Information on available alternative treatments and the risks and benefits of those alternatives, including clinical trials.  4. Patients treated with COVID monoclonal antibody should continue to self-isolate and use infection control measures (e.g., wear mask, isolate, social distance, avoid sharing personal items, clean and disinfect "high touch" surfaces, and frequent handwashing) according to CDC guidelines.   5. The patient or parent/caregiver has the option to accept or refuse COVID monoclonal antibody treatment.  After reviewing this information with the patient, The patient agreed  to proceed with receiving casirivimab\imdevimab infusion and will be provided a copy of the Fact sheet prior to receiving the infusion.   Mauricio Po, NP 03/06/2020 11:41 AM

## 2020-03-07 ENCOUNTER — Ambulatory Visit (HOSPITAL_COMMUNITY): Payer: 59

## 2020-03-08 ENCOUNTER — Telehealth (INDEPENDENT_AMBULATORY_CARE_PROVIDER_SITE_OTHER): Payer: 59 | Admitting: Family

## 2020-03-08 ENCOUNTER — Encounter: Payer: Self-pay | Admitting: Family

## 2020-03-08 ENCOUNTER — Ambulatory Visit (HOSPITAL_COMMUNITY)
Admission: RE | Admit: 2020-03-08 | Discharge: 2020-03-08 | Disposition: A | Discharge status: Home / Self Care | Payer: 59 | Source: Ambulatory Visit | Attending: Pulmonary Disease | Admitting: Pulmonary Disease

## 2020-03-08 ENCOUNTER — Other Ambulatory Visit: Payer: Self-pay

## 2020-03-08 ENCOUNTER — Other Ambulatory Visit (HOSPITAL_COMMUNITY): Payer: Self-pay

## 2020-03-08 DIAGNOSIS — F419 Anxiety disorder, unspecified: Secondary | ICD-10-CM | POA: Diagnosis not present

## 2020-03-08 DIAGNOSIS — F329 Major depressive disorder, single episode, unspecified: Secondary | ICD-10-CM

## 2020-03-08 DIAGNOSIS — U071 COVID-19: Secondary | ICD-10-CM | POA: Diagnosis not present

## 2020-03-08 DIAGNOSIS — F900 Attention-deficit hyperactivity disorder, predominantly inattentive type: Secondary | ICD-10-CM | POA: Diagnosis not present

## 2020-03-08 DIAGNOSIS — F17210 Nicotine dependence, cigarettes, uncomplicated: Secondary | ICD-10-CM | POA: Diagnosis present

## 2020-03-08 DIAGNOSIS — F32A Depression, unspecified: Secondary | ICD-10-CM

## 2020-03-08 MED ORDER — FAMOTIDINE IN NACL 20-0.9 MG/50ML-% IV SOLN: 20.0000 mg | Freq: Once | INTRAVENOUS | Status: DC | PRN

## 2020-03-08 MED ORDER — EPINEPHRINE 0.3 MG/0.3ML IJ SOAJ: 0.3000 mg | Freq: Once | INTRAMUSCULAR | Status: DC | PRN

## 2020-03-08 MED ORDER — ALBUTEROL SULFATE HFA 108 (90 BASE) MCG/ACT IN AERS: 2.0000 | INHALATION_SPRAY | Freq: Once | RESPIRATORY_TRACT | Status: DC | PRN

## 2020-03-08 MED ORDER — SODIUM CHLORIDE 0.9 % IV SOLN: INTRAVENOUS | Status: DC | PRN

## 2020-03-08 MED ORDER — METHYLPREDNISOLONE SODIUM SUCC 125 MG IJ SOLR: 125.0000 mg | Freq: Once | INTRAMUSCULAR | Status: DC | PRN

## 2020-03-08 MED ORDER — BUPROPION HCL ER (XL) 300 MG PO TB24: 300.0000 mg | ORAL_TABLET | Freq: Every day | ORAL | 1 refills | Status: DC

## 2020-03-08 MED ORDER — SODIUM CHLORIDE 0.9 % IV SOLN
1200.0000 mg | Freq: Once | INTRAVENOUS | Status: AC
  Administered 2020-03-08: 1200 mg via INTRAVENOUS

## 2020-03-08 MED ORDER — DIPHENHYDRAMINE HCL 50 MG/ML IJ SOLN: 50.0000 mg | Freq: Once | INTRAMUSCULAR | Status: DC | PRN

## 2020-03-08 NOTE — Progress Notes (Signed)
  Diagnosis: COVID-19  Physician: Dr. Joya Gaskins   Procedure: [facility] Med: casirivimab\imdevimab infusion - Provided patient with casirivimab\imdevimab fact sheet for patients, parents and caregivers prior to infusion.  Complications: No immediate complications noted.  Discharge: Discharged home   Claudia Desanctis 03/08/2020 ,m

## 2020-03-08 NOTE — Assessment & Plan Note (Addendum)
Improved on adderall. Pending  NEW CSC, EKG, urine screen, and  new referral to be formally tested again as she doesn't have records. She will come in [place] virtual follow up.  I looked up patient on [place] Controlled Substances Reporting System PMP AWARE and saw no activity that raised concern of inappropriate use.

## 2020-03-08 NOTE — Discharge Instructions (Signed)

## 2020-03-08 NOTE — Patient Instructions (Signed)
Please start Mucinex DM to take at bedtime for cough.     Please begin Vit C 1000 mg daily   Vitamin D3 4000 IU daily   Zinc 100 g daily   Quercetin 250 mg -500 mg twice daily    Rest, hydrate very well, eat healthy protein food, Tylenol or Advil as directed .    Please go to Acute Care if symptoms worsen but are not an emergency. Office video visit again early next week.    Remain in self quarantine until at least 10 days since symptom onset AND 3 consecutive days fever free without antipyretics AND improvement in respiratory symptoms.    Utilize over the counter medications to treat symptoms.   Seek  treatment in the ED if respiratory issues/distress develops or unrelieved chest pain. Or, if you become severely weak, dehydrated, confused.    Only leave home to seek medical care and must wear a mask in public. Limit contact with family members or caregivers in the home and to notify her family who she was with yesterday that they should quarantine for 14 days. Practice social distancing and to continue to use good preventative care measures such has frequent hand washing.      Your COVID-19 test was resulted as positive.    You should continue your self imposed quantarine until you can answer "yes" to ALL 3 of the conditions below:   1) Your symptoms (fever, cough, shortness of breath) started 7 or more days ago   2) Your body temperature  has been normal for at least 72 hours (WITHOUT the use of any tylenol, motrin or aleve) . Normal is < 100.4 Farenheit  3) your other flu  like symptoms symptoms are getting better.   YOUR FAMILY or other household contacts, however , even if they feel fine , will need to continue to quarantining themselves  (that means no contact with ANYONE outside of the house)  for 14 days STARTING from the end of your initial 7 day period . (why? because they have been theoretically exposed to you during the entire 7 days of your illness, and they can sheD  the virus without symptoms for that period of time ).         10 Things You Can Do to Manage Your COVID-19 Symptoms at Home If you have possible or confirmed COVID-19: 1. Stay home from work and school. And stay away from other public places. If you must go out, avoid using any kind of public transportation, ridesharing, or taxis. 2. Monitor your symptoms carefully. If your symptoms get worse, call your healthcare provider immediately. 3. Get rest and stay hydrated. 4. If you have a medical appointment, call the healthcare provider ahead of time and tell them that you have or may have COVID-19. 5. For medical emergencies, call 911 and notify the dispatch personnel that you have or may have COVID-19. 6. Cover your cough and sneezes with a tissue or use the inside of your elbow. 7. Wash your hands often with soap and water for at least 20 seconds or clean your hands with an alcohol-based hand sanitizer that contains at least 60% alcohol. 8. As much as possible, stay in a specific room and away from other people in your home. Also, you should use a separate bathroom, if available. If you need to be around other people in or outside of the home, wear a mask. 9. Avoid sharing personal items with other people in your household,  like dishes, towels, and bedding. 10. Clean all surfaces that are touched often, like counters, tabletops, and doorknobs. Use household cleaning sprays or wipes according to the label instructions. [email or web address] 12/18/2018

## 2020-03-08 NOTE — Assessment & Plan Note (Signed)
Improving. No acute respiratory distress. No SOB. She getting monoclonol antibody infusion today. Advised of quarantine at least 4 more days and until symptoms improved per AVS. She will log in to read this. She will let me know if she needs anything else.

## 2020-03-08 NOTE — Assessment & Plan Note (Signed)
Improved. Continue wellbutrin, celexa , prn xanax. She hasnt needed trazodone however will keep on chart.

## 2020-03-08 NOTE — Progress Notes (Signed)
Virtual Visit via Video Note  I connected with@  on 03/08/20 at 12:00 PM EDT by a video enabled telemedicine application and verified that I am speaking with the correct person using two identifiers.  Location patient: home Location provider:work  Persons participating in the virtual visit: patient, provider  I discussed the limitations of evaluation and management by telemedicine and the availability of in person appointments. The patient expressed understanding and agreed to proceed.  Interactive audio and video telecommunications were attempted between this provider and patient, however failed, due to patient having technical difficulties or patient did not have access to video capability.  We continued and completed visit with audio only.    HPI:  CC: fever and bodyaches started 6 days ago,  improving. Fever has resolved.   Endorses cough, congestion, fatigue HA , chills, nausea resolved.  No sob, diarrhea, vomiting.  Drinking lots of water, eating decreased.  Taking mucinex D ,with relief. Taking vit d, vit c, zinc.   COVID positive 6 days ago.  Leaving for regeneron infusion at 2pm  She is not vaccinated for covid  Daughter has covid as well.   Depression and anxiety- improved. Started new job and 'much happier' . Sleeping well.  Compliant with 300mg  wellbutrin, celexa 40mg . No si/hi. Husband is doing well. Feels safe at home. Has xanax and using most days once per day. Storing in safe place. hasnt needed trazodone.   ADHD- Formally diagnosed in [place] by psychiatry, diagnosed 15 years ago. Cannot recall name and doesn't think she has records. Adderall working well. adderall 5mg  BID during week while at work. Some days doesn't take 5mg  twice per day. Helpful with concentration. No palpitations, cp, increased anxiety.  No recent ekg, last 2016   ROS: See pertinent positives and negatives per HPI.    EXAM:  VITALS per patient if applicable:    ASSESSMENT AND  PLAN:  Discussed the following assessment and plan:  Problem List Items Addressed This Visit      Other   Anxiety and depression    Improved. Continue wellbutrin, celexa , prn xanax. She hasnt needed trazodone however will keep on chart.       Relevant Medications   buPROPion (WELLBUTRIN XL) 300 MG 24 hr tablet   Attention deficit hyperactivity disorder, predominantly inattentive type    Improved on adderall. Pending  NEW CSC, EKG, urine screen, and  new referral to be formally tested again as she doesn't have records. She will come in [place] virtual follow up.  I looked up patient on [place] Controlled Substances Reporting System PMP AWARE and saw no activity that raised concern of inappropriate use.            Relevant Orders   Ambulatory referral to Psychiatry   COVID-19    Improving. No acute respiratory distress. No SOB. She getting monoclonol antibody infusion today. Advised of quarantine at least 4 more days and until symptoms improved per AVS. She will log in to read this. She will let me know if she needs anything else.          -we discussed possible serious and likely etiologies, options for evaluation and workup, limitations of telemedicine visit vs in person visit, treatment, treatment risks and precautions. Pt prefers to treat via telemedicine empirically rather then risking or undertaking an in person visit at this moment.  .   I discussed the assessment and treatment plan with the patient. The patient was provided an opportunity to ask questions  and all were answered. The patient agreed with the plan and demonstrated an understanding of the instructions.   The patient was advised to call back or seek an in-person evaluation if the symptoms worsen or if the condition fails to improve as anticipated.  I have spent 21 minutes with a patient including precharting ,reviewing medical records, and discussion plan of care.       Rachel Paris, FNP

## 2020-03-27 ENCOUNTER — Other Ambulatory Visit: Payer: Self-pay | Admitting: Family

## 2020-03-27 DIAGNOSIS — R519 Headache, unspecified: Secondary | ICD-10-CM

## 2020-04-04 ENCOUNTER — Other Ambulatory Visit: Payer: Self-pay | Admitting: Family

## 2020-04-04 DIAGNOSIS — F32A Depression, unspecified: Secondary | ICD-10-CM

## 2020-04-04 DIAGNOSIS — F900 Attention-deficit hyperactivity disorder, predominantly inattentive type: Secondary | ICD-10-CM

## 2020-04-04 DIAGNOSIS — F419 Anxiety disorder, unspecified: Secondary | ICD-10-CM

## 2020-04-05 MED ORDER — AMPHETAMINE-DEXTROAMPHETAMINE 5 MG PO TABS: 5.0000 mg | ORAL_TABLET | Freq: Two times a day (BID) | ORAL | 0 refills | Status: DC | PRN

## 2020-04-05 NOTE — Telephone Encounter (Signed)
Last refill 02/09/20

## 2020-04-08 ENCOUNTER — Other Ambulatory Visit: Payer: Self-pay | Admitting: Internal Medicine

## 2020-04-08 DIAGNOSIS — E039 Hypothyroidism, unspecified: Secondary | ICD-10-CM

## 2020-04-28 ENCOUNTER — Encounter: Payer: Self-pay | Admitting: Family

## 2020-04-29 ENCOUNTER — Ambulatory Visit (INDEPENDENT_AMBULATORY_CARE_PROVIDER_SITE_OTHER): Payer: 59

## 2020-04-29 ENCOUNTER — Ambulatory Visit
Admission: EM | Admit: 2020-04-29 | Discharge: 2020-04-29 | Disposition: A | Discharge status: Home / Self Care | Payer: 59 | Attending: Emergency Medicine | Admitting: Emergency Medicine

## 2020-04-29 ENCOUNTER — Ambulatory Visit: Payer: Self-pay

## 2020-04-29 ENCOUNTER — Other Ambulatory Visit: Payer: Self-pay

## 2020-04-29 DIAGNOSIS — M25512 Pain in left shoulder: Secondary | ICD-10-CM | POA: Diagnosis not present

## 2020-04-29 MED ORDER — IBUPROFEN 600 MG PO TABS: 600.0000 mg | ORAL_TABLET | Freq: Four times a day (QID) | ORAL | 0 refills | Status: DC | PRN

## 2020-04-29 NOTE — ED Triage Notes (Signed)
Pt reports while taking dog for a walk, dog caused her to fall landed on her left shoulder. C/o of left shoulder pain. States pain a 8/10 pain level.

## 2020-04-29 NOTE — Discharge Instructions (Addendum)
Take the ibuprofen as prescribed.  Rest your shoulder.  Apply ice packs 2-3 times a day for up to 20 minutes each.     Follow up with an orthopedist if you symptoms continue or worsen.

## 2020-04-29 NOTE — ED Provider Notes (Signed)
Rachel Alexander    CSN: [id number] Arrival date & time: 04/29/20  1400      History   Chief Complaint Chief Complaint  Patient presents with  . Shoulder Pain    HPI Rachel Alexander is a 39 y.o. female.   Patient presents with pain in her left shoulder today.  She was taking her dog for a walk when her dog pulled her and she fell.  She denies numbness, weakness, paresthesias, wounds, redness, rash, or other symptoms.  Treatment attempted at home with Tylenol.  Her medical history includes COVID-19 in September 2021, anxiety, depression, suicidal intent, headaches, ADHD, hypothyroidism, pituitary tumor, colitis.  The history is provided by the patient and medical records.    Past Medical History:  Diagnosis Date  . Colitis   . Depression   . Frequent headaches   . Hypothyroidism   . Migraines   . Pituitary tumor   . Suicidal intent     Patient Active Problem List   Diagnosis Date Noted  . COVID-19 03/08/2020  . Discharge from right nipple 12/26/2019  . Acquired hypothyroidism 10/10/2019  . Frequent headaches 07/28/2019  . Routine physical examination 04/18/2019  . Anxiety and depression 09/04/2018  . Cigarette smoker 02/15/2017  . 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 Constipation 07/23/1998    Past Surgical History:  Procedure Laterality Date  . BREAST CYST ASPIRATION Right 2012  . COLONOSCOPY N/A 08/24/2019   Procedure: COLONOSCOPY;  Surgeon: Jonathon Bellows, MD;  Location: [facility] ENDOSCOPY;  Service: Gastroenterology;  Laterality: N/A;  . TONSILLECTOMY    . TUBAL LIGATION      OB History   No obstetric history on file.      Home Medications    Prior to Admission medications   Medication Sig Start Date End Date Taking? Authorizing Provider  ALPRAZolam (XANAX) 1 MG tablet TAKE 1 TABLET (1 MG TOTAL) BY MOUTH 2 (TWO) TIMES DAILY AS NEEDED FOR ANXIETY. 04/05/20   Burnard Hawthorne, FNP  amphetamine-dextroamphetamine (ADDERALL) 5 MG tablet Take 1 tablet (5 mg total) by mouth 2 (two) times daily as needed. 04/05/20   Burnard Hawthorne, FNP  amphetamine-dextroamphetamine (ADDERALL) 5 MG tablet Take 1 tablet (5 mg total) by mouth 2 (two) times daily as needed. 04/05/20   Burnard Hawthorne, FNP  amphetamine-dextroamphetamine (ADDERALL) 5 MG tablet Take 1 tablet (5 mg total) by mouth 2 (two) times daily as needed. 04/05/20   Burnard Hawthorne, FNP  buPROPion (WELLBUTRIN XL) 300 MG 24 hr tablet Take 1 tablet (300 mg total) by mouth daily with breakfast. 03/08/20   Burnard Hawthorne, FNP  cholecalciferol (VITAMIN D3) 25 MCG (1000 UNIT) tablet Take 1,000 Units by mouth daily.    [provider]  citalopram (CELEXA) 40 MG tablet Take 1 tablet (40 mg total) by mouth daily. 12/26/19   Burnard Hawthorne, FNP  CRANBERRY PO Take by mouth.    [provider]  ibuprofen (ADVIL) 600 MG tablet Take 1 tablet (600 mg total) by mouth every 6 (six) hours as needed. 04/29/20   Sharion Balloon, NP  lactulose (CEPHULAC) 20 g packet Take 1 packet (20 g total) by mouth 3 (three) times daily. 09/03/19   Jonathon Bellows, MD  SUMAtriptan (IMITREX) 25 MG tablet TAKE 1 TABLET (25 MG TOTAL) BY MOUTH ONCE FOR 1 DOSE. MAY REPEAT ONCE 2 HOURS LATER IF HEADACHE PERSISTS OR RECURS. MAX 2 DOSES. 03/29/20 03/29/20  Burnard Hawthorne, FNP  SYNTHROID 150 MCG tablet TAKE 1 TABLET (150 MCG TOTAL) BY MOUTH DAILY AT 6 (SIX) AM. 2 TABLETS ON SUNDAYS, 1 TABLET THE REST OF THE WEEK 04/08/20   Shamleffer, Melanie Crazier, MD  traZODone (DESYREL) 50 MG tablet TAKE 0.5-1 TABLETS (25-50 MG TOTAL) BY MOUTH AT BEDTIME AS NEEDED FOR SLEEP. 01/08/20   Burnard Hawthorne, FNP  dicyclomine (BENTYL) 20 MG tablet Take 1 tablet (20 mg total) by mouth 2 (two) times daily. 08/06/14 09/25/14  Antonietta Breach, PA-C    Family History Family History  Problem Relation Age of Onset  . Healthy Mother   . Liver cancer Father     . Diabetes Sister   . Breast cancer Maternal Aunt     Social History Social History   Tobacco Use  . Smoking status: Former Smoker    Packs/day: 1.00    Types: Cigarettes    Quit date: 10/06/2017    Years since quitting: 2.5  . Smokeless tobacco: Never Used  Vaping Use  . Vaping Use: Every day  Substance Use Topics  . Alcohol use: No  . Drug use: No     Allergies   Patient has no known allergies.   Review of Systems Review of Systems  Constitutional: Negative for chills and fever.  HENT: Negative for ear pain and sore throat.   Eyes: Negative for pain and visual disturbance.  Respiratory: Negative for cough and shortness of breath.   Cardiovascular: Negative for chest pain and palpitations.  Gastrointestinal: Negative for abdominal pain and vomiting.  Genitourinary: Negative for dysuria and hematuria.  Musculoskeletal: Positive for arthralgias. Negative for back pain.  Skin: Negative for color change and rash.  Neurological: Negative for seizures, syncope, weakness and numbness.  All other systems reviewed and are negative.    Physical Exam Triage Vital Signs ED Triage Vitals  Enc Vitals Group     BP      Pulse      Resp      Temp      Temp src      SpO2      Weight      Height      Head Circumference      Peak Flow      Pain Score      Pain Loc      Pain Edu?      Excl. in [place]?    No data found.  Updated Vital Signs BP 107/67 (BP Location: Right Arm)   Pulse 75   Temp 98.7 F (37.1 C) (Oral)   Resp 16   LMP 04/02/2020   SpO2 97%   Visual Acuity Right Eye Distance:   Left Eye Distance:   Bilateral Distance:    Right Eye Near:   Left Eye Near:    Bilateral Near:     Physical Exam Vitals and nursing note reviewed.  Constitutional:      General: She is not in acute distress.    Appearance: She is well-developed. She is not ill-appearing.  HENT:     Head: Normocephalic and atraumatic.     Mouth/Throat:     Mouth: Mucous membranes  are moist.  Eyes:     Conjunctiva/sclera: Conjunctivae normal.  Cardiovascular:     Rate and Rhythm: Normal rate and regular rhythm.     Heart sounds: Normal heart sounds.  Pulmonary:     Effort: Pulmonary effort is normal. No respiratory distress.     Breath  sounds: Normal breath sounds.  Abdominal:     Palpations: Abdomen is soft.     Tenderness: There is no abdominal tenderness.  Musculoskeletal:        General: Tenderness present. No swelling or deformity. Normal range of motion.       Arms:     Cervical back: Neck supple.  Skin:    General: Skin is warm and dry.     Findings: No bruising, erythema, lesion or rash.  Neurological:     General: No focal deficit present.     Mental Status: She is alert and oriented to person, place, and time.     Sensory: No sensory deficit.     Motor: No weakness.     Coordination: Coordination normal.     Gait: Gait normal.  Psychiatric:        Mood and Affect: Mood normal.        Behavior: Behavior normal.      UC Treatments / Results  Labs (all labs ordered are listed, but only abnormal results are displayed) Labs Reviewed - No data to display  EKG   Radiology DG Shoulder Left  Result Date: 04/29/2020 CLINICAL DATA:  Left shoulder pain after a fall today. Initial encounter. EXAM: LEFT SHOULDER - 2+ VIEW COMPARISON:  None. FINDINGS: There is no evidence of fracture or dislocation. There is no evidence of arthropathy or other focal bone abnormality. Soft tissues are unremarkable. IMPRESSION: Normal exam. Electronically Signed   By: Inge Rise M.D.   On: 04/29/2020 14:26    Procedures Procedures (including critical care time)  Medications Ordered in UC Medications - No data to display  Initial Impression / Assessment and Plan / UC Course  I have reviewed the triage vital signs and the nursing notes.  Pertinent labs & imaging results that were available during my care of the patient were reviewed by me and considered  in my medical decision making (see chart for details).   Left shoulder pain.  Xray negative.  Treating with ibuprofen, rest, ice packs.  Patient declines sling.  Instructed her to follow-up with orthopedics if her symptoms are not improving.  Patient agrees to plan of care.   Final Clinical Impressions(s) / UC Diagnoses   Final diagnoses:  Acute pain of left shoulder     Discharge Instructions     Take the ibuprofen as prescribed.  Rest your shoulder.  Apply ice packs 2-3 times a day for up to 20 minutes each.     Follow up with an orthopedist if you symptoms continue or worsen.         ED Prescriptions    Medication Sig Dispense Auth. Provider   ibuprofen (ADVIL) 600 MG tablet Take 1 tablet (600 mg total) by mouth every 6 (six) hours as needed. 30 tablet Sharion Balloon, NP     PDMP not reviewed this encounter.   Sharion Balloon, NP 04/29/20 1436

## 2020-04-30 ENCOUNTER — Telehealth: Payer: Self-pay | Admitting: Family

## 2020-04-30 ENCOUNTER — Other Ambulatory Visit: Payer: Self-pay | Admitting: Family

## 2020-04-30 DIAGNOSIS — F900 Attention-deficit hyperactivity disorder, predominantly inattentive type: Secondary | ICD-10-CM

## 2020-04-30 NOTE — Telephone Encounter (Signed)
LM with patient that there were refills at the pharmacy. I also called [facility] to confirm this.

## 2020-04-30 NOTE — Telephone Encounter (Signed)
Call pt There are adderall refills for this month and next at pharmacy Please ask her to call pharmacy first as I sent 3 month separate scripts each time Ensure she has in person follow up scheduled with me

## 2020-05-05 ENCOUNTER — Other Ambulatory Visit: Payer: Self-pay | Admitting: Family

## 2020-05-05 DIAGNOSIS — F900 Attention-deficit hyperactivity disorder, predominantly inattentive type: Secondary | ICD-10-CM

## 2020-05-06 ENCOUNTER — Other Ambulatory Visit: Payer: Self-pay | Admitting: Family

## 2020-05-06 DIAGNOSIS — F900 Attention-deficit hyperactivity disorder, predominantly inattentive type: Secondary | ICD-10-CM

## 2020-05-07 ENCOUNTER — Telehealth: Payer: Self-pay | Admitting: Family

## 2020-05-07 DIAGNOSIS — F900 Attention-deficit hyperactivity disorder, predominantly inattentive type: Secondary | ICD-10-CM

## 2020-05-07 NOTE — Telephone Encounter (Signed)
Call pt There is refill for adderall at her pharmacy Call pharmacy first   She is due in the next 1-2 months for IN Person follow up for controlled substances Please sch   I looked up patient on [place] Controlled Substances Reporting System PMP AWARE and saw no activity that raised concern of inappropriate use.

## 2020-05-07 NOTE — Telephone Encounter (Signed)
LMTCB to make sure that patient had been able to get medication. Also patient needs to scheduled a follow-up.

## 2020-05-11 NOTE — Telephone Encounter (Signed)
Mychart message sent to patient.

## 2020-05-28 ENCOUNTER — Ambulatory Visit
Admission: RE | Admit: 2020-05-28 | Discharge: 2020-05-28 | Disposition: A | Discharge status: Home / Self Care | Payer: 59 | Source: Ambulatory Visit | Attending: Family Medicine | Admitting: Family Medicine

## 2020-05-28 ENCOUNTER — Telehealth: Payer: Self-pay

## 2020-05-28 VITALS — BP 121/78 | HR 88 | Temp 98.6°F | Resp 18 | Ht 61.0 in | Wt 112.0 lb

## 2020-05-28 DIAGNOSIS — R454 Irritability and anger: Secondary | ICD-10-CM

## 2020-05-28 DIAGNOSIS — F32A Depression, unspecified: Secondary | ICD-10-CM

## 2020-05-28 DIAGNOSIS — R000 Tachycardia, unspecified: Secondary | ICD-10-CM

## 2020-05-28 DIAGNOSIS — F411 Generalized anxiety disorder: Secondary | ICD-10-CM

## 2020-05-28 DIAGNOSIS — F419 Anxiety disorder, unspecified: Secondary | ICD-10-CM

## 2020-05-28 DIAGNOSIS — R634 Abnormal weight loss: Secondary | ICD-10-CM

## 2020-05-28 LAB — POCT URINALYSIS DIP (MANUAL ENTRY)
Glucose, UA: NEGATIVE mg/dL
Leukocytes, UA: NEGATIVE
Nitrite, UA: NEGATIVE
Protein Ur, POC: NEGATIVE mg/dL
Spec Grav, UA: >=1.03 — AB (ref 1.010–1.025)
Urobilinogen, UA: 0.2 E.U./dL
pH, UA: 5.5 (ref 5.0–8.0)

## 2020-05-28 LAB — POCT URINE PREGNANCY: Preg Test, Ur: NEGATIVE

## 2020-05-28 MED ORDER — ALPRAZOLAM 2 MG PO TABS
2.0000 mg | ORAL_TABLET | Freq: Two times a day (BID) | ORAL | 0 refills | Status: AC | PRN
Start: 2020-05-28 — End: 2020-05-31

## 2020-05-28 NOTE — Telephone Encounter (Signed)
Pt called upset & panicking. She said that since Monday she has been having panic attacks. She will have episodes where she will be so nervous & sweating then she will become freezing cold. She said at the moment she was having heart palpitations & her chest was tight. She felt like her TSH levels were once again out of range. She asked to have labs just ordered, so she could have checked. Due to her chest tightness & heart palpitations I wanted patient to be seen just to be 100% sure it wasn't cardiac in nature. Pt has had panic attacks before, but I wanted to be sure this is what she as dealing with now. I also advised that UC could either draw her labs or send her to hospital lab to have drawn. I explained that even though Joycelyn Schmid may have said that she would order TSH anytime for her to have checked that she was out of office & I don't have that documented. Pt was understanding & said that she was scared, so she would go to UC. I asked that she keep [place] in the loop & we could possibly put her in one of our acute spots for next week if needed. She said that she would update [place] after she sees UC.

## 2020-05-28 NOTE — Discharge Instructions (Addendum)
Your EKG was not concerning today. Urine did not show any infection.  You do appear to be mildly dehydrated.  Try to increase her fluids.  I believe your symptoms possibly could be related to too much thyroid medication.  We will have your blood work back by in the morning. If your thyroid levels are normal we would think about things like premenopausal or menopausal symptoms. Follow up as needed for continued or worsening symptoms

## 2020-05-28 NOTE — ED Triage Notes (Signed)
Pt reports feeling irritable, having severe panic attacks. sts "she feels like her thyroid is off". "I have been having crying spells, loosing weight daily". "I feel manic".

## 2020-05-28 NOTE — ED Provider Notes (Signed)
Roderic Palau    CSN: [id number] Arrival date & time: 05/28/20  1330      History   Chief Complaint Chief Complaint  Patient presents with  . Appointment  . Panic Attack    HPI Rachel Alexander is a 39 y.o. female.   Patient is a 39 year old female with past medical history of colitis, depression, hypothyroidism, migraines, pituitary tumor, suicidal intent, anxiety and depression, ADD.  She presents today for severe anxiety, panic attacks.  She reports she is also had some heart palpitations, weight loss and crying spells.  She has been very irritable and feels "manic".  She has been using her Xanax but does not seem to be helping.  Denies any suicidal ideations at this time.  Feels like her thyroid levels may be off.  She is also had some urinary frequency with retention.  No fevers, chills, body aches, cough, chest congestion, nausea, vomiting or diarrhea.  No chest pain or shortness of breath.     Past Medical History:  Diagnosis Date  . Colitis   . Depression   . Frequent headaches   . Hypothyroidism   . Migraines   . Pituitary tumor   . Suicidal intent     Patient Active Problem List   Diagnosis Date Noted  . COVID-19 03/08/2020  . Discharge from right nipple 12/26/2019  . Acquired hypothyroidism 10/10/2019  . Frequent headaches 07/28/2019  . Routine physical examination 04/18/2019  . Anxiety and depression 09/04/2018  . Cigarette smoker 02/15/2017  . 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 Constipation 07/23/1998    Past Surgical History:  Procedure Laterality Date  . BREAST CYST ASPIRATION Right 2012  . COLONOSCOPY N/A 08/24/2019   Procedure: COLONOSCOPY;  Surgeon: Jonathon Bellows, MD;  Location: [facility] ENDOSCOPY;  Service: Gastroenterology;  Laterality: N/A;  . TONSILLECTOMY    . TUBAL LIGATION      OB History   No obstetric history on file.      Home  Medications    Prior to Admission medications   Medication Sig Start Date End Date Taking? Authorizing Provider  ALPRAZolam Duanne Moron) 2 MG tablet Take 1 tablet (2 mg total) by mouth 2 (two) times daily as needed for up to 3 days for anxiety. 05/28/20 05/31/20  Loura Halt A, NP  amphetamine-dextroamphetamine (ADDERALL) 5 MG tablet Take 1 tablet (5 mg total) by mouth 2 (two) times daily as needed. 04/05/20   Burnard Hawthorne, FNP  amphetamine-dextroamphetamine (ADDERALL) 5 MG tablet Take 1 tablet (5 mg total) by mouth 2 (two) times daily as needed. 04/05/20   Burnard Hawthorne, FNP  amphetamine-dextroamphetamine (ADDERALL) 5 MG tablet Take 1 tablet (5 mg total) by mouth 2 (two) times daily as needed. 04/05/20   Burnard Hawthorne, FNP  buPROPion (WELLBUTRIN XL) 300 MG 24 hr tablet Take 1 tablet (300 mg total) by mouth daily with breakfast. 03/08/20   Burnard Hawthorne, FNP  cholecalciferol (VITAMIN D3) 25 MCG (1000 UNIT) tablet Take 1,000 Units by mouth daily.    [provider]  citalopram (CELEXA) 40 MG tablet Take 1 tablet (40 mg total) by mouth daily. 12/26/19   Burnard Hawthorne, FNP  CRANBERRY PO Take by mouth.    [provider]  ibuprofen (ADVIL) 600 MG tablet Take 1 tablet (600 mg total) by mouth every 6 (six) hours as needed. 04/29/20   Sharion Balloon, NP  lactulose (CEPHULAC) 20 g packet Take  1 packet (20 g total) by mouth 3 (three) times daily. 09/03/19   Jonathon Bellows, MD  SUMAtriptan (IMITREX) 25 MG tablet TAKE 1 TABLET (25 MG TOTAL) BY MOUTH ONCE FOR 1 DOSE. MAY REPEAT ONCE 2 HOURS LATER IF HEADACHE PERSISTS OR RECURS. MAX 2 DOSES. 03/29/20 03/29/20  Burnard Hawthorne, FNP  SYNTHROID 150 MCG tablet TAKE 1 TABLET (150 MCG TOTAL) BY MOUTH DAILY AT 6 (SIX) AM. 2 TABLETS ON SUNDAYS, 1 TABLET THE REST OF THE WEEK 04/08/20   Shamleffer, Melanie Crazier, MD  traZODone (DESYREL) 50 MG tablet TAKE 0.5-1 TABLETS (25-50 MG TOTAL) BY MOUTH AT BEDTIME AS NEEDED FOR SLEEP. 01/08/20    Burnard Hawthorne, FNP  dicyclomine (BENTYL) 20 MG tablet Take 1 tablet (20 mg total) by mouth 2 (two) times daily. 08/06/14 09/25/14  Antonietta Breach, PA-C    Family History Family History  Problem Relation Age of Onset  . Healthy Mother   . Liver cancer Father   . Diabetes Sister   . Breast cancer Maternal Aunt     Social History Social History   Tobacco Use  . Smoking status: Former Smoker    Packs/day: 1.00    Types: Cigarettes    Quit date: 10/06/2017    Years since quitting: 2.6  . Smokeless tobacco: Never Used  Vaping Use  . Vaping Use: Every day  Substance Use Topics  . Alcohol use: Yes    Comment: occ  . Drug use: No     Allergies   Patient has no known allergies.   Review of Systems Review of Systems   Physical Exam Triage Vital Signs ED Triage Vitals  Enc Vitals Group     BP 05/28/20 1346 121/78     Pulse Rate 05/28/20 1346 88     Resp 05/28/20 1346 18     Temp 05/28/20 1346 98.6 F (37 C)     Temp Source 05/28/20 1346 Oral     SpO2 05/28/20 1346 98 %     Weight 05/28/20 1346 112 lb (50.8 kg)     Height 05/28/20 1346 5\' 1"  (1.549 m)     Head Circumference --      Peak Flow --      Pain Score 05/28/20 1347 4     Pain Loc --      Pain Edu? --      Excl. in [place]? --    No data found.  Updated Vital Signs BP 121/78 (BP Location: Left Arm)   Pulse 88   Temp 98.6 F (37 C) (Oral)   Resp 18   Ht 5\' 1"  (1.549 m)   Wt 112 lb (50.8 kg)   SpO2 98%   BMI 21.16 kg/m   Visual Acuity Right Eye Distance:   Left Eye Distance:   Bilateral Distance:    Right Eye Near:   Left Eye Near:    Bilateral Near:     Physical Exam Vitals and nursing note reviewed.  Constitutional:      General: She is not in acute distress.    Appearance: Normal appearance. She is not ill-appearing, toxic-appearing or diaphoretic.  HENT:     Head: Normocephalic.     Right Ear: Tympanic membrane and ear canal normal.     Left Ear: Tympanic membrane and ear canal normal.      Nose: Nose normal.     Mouth/Throat:     Pharynx: Oropharynx is clear.  Eyes:     Conjunctiva/sclera: Conjunctivae normal.  Cardiovascular:     Rate and Rhythm: Normal rate.     Heart sounds: Normal heart sounds.  Pulmonary:     Effort: Pulmonary effort is normal.  Musculoskeletal:        General: Normal range of motion.     Cervical back: Normal range of motion.  Skin:    General: Skin is warm and dry.     Findings: No rash.  Neurological:     Mental Status: She is alert.  Psychiatric:        Attention and Perception: Attention normal.        Mood and Affect: Mood is anxious.        Behavior: Behavior is cooperative.        Cognition and Memory: Cognition normal.        Judgment: Judgment normal.      UC Treatments / Results  Labs (all labs ordered are listed, but only abnormal results are displayed) Labs Reviewed  POCT URINALYSIS DIP (MANUAL ENTRY) - Abnormal; Notable for the following components:      Result Value   Bilirubin, UA small (*)    Ketones, POC UA large (80) (*)    Spec Grav, UA >=1.030 (*)    Blood, UA trace-intact (*)    All other components within normal limits  CBC WITH DIFFERENTIAL/PLATELET  COMPREHENSIVE METABOLIC PANEL  TSH  POCT URINE PREGNANCY    EKG   Radiology No results found.  Procedures Procedures (including critical care time)  Medications Ordered in UC Medications - No data to display  Initial Impression / Assessment and Plan / UC Course  I have reviewed the triage vital signs and the nursing notes.  Pertinent labs & imaging results that were available during my care of the patient were reviewed by me and considered in my medical decision making (see chart for details).     Anxiety, palpitations, weight loss, irritability. EKG with normal sinus rhythm and normal rate today.  Did have some irregular beats or palpitations during auscultation. Urine did not show any infection today.  Mildly dehydrated on urinalysis.   Recommended push fluids. Patient having a lot of consistent symptoms with hyperthyroidism or taking too much thyroid medication. We are drawing some blood work to include CBC with differential, CMP and TSH. I have increased her dose of Xanax for now until we can figure out what is going on and until she can see her doctor next week.   Labs pending.  Otherwise vital stable. Not suicidal at this time.  ER for worse symptoms  Final Clinical Impressions(s) / UC Diagnoses   Final diagnoses:  Tachycardia  Anxiety state  Loss of weight  Irritable     Discharge Instructions     Your EKG was not concerning today. Urine did not show any infection.  You do appear to be mildly dehydrated.  Try to increase her fluids.  I believe your symptoms possibly could be related to too much thyroid medication.  We will have your blood work back by in the morning. If your thyroid levels are normal we would think about things like premenopausal or menopausal symptoms. Follow up as needed for continued or worsening symptoms     ED Prescriptions    Medication Sig Dispense Auth. Provider   ALPRAZolam Duanne Moron) 2 MG tablet Take 1 tablet (2 mg total) by mouth 2 (two) times daily as needed for up to 3 days for anxiety. 6 tablet Orvan July, NP  PDMP not reviewed this encounter.   Orvan July, NP 05/28/20 1529

## 2020-05-30 ENCOUNTER — Other Ambulatory Visit: Payer: Self-pay | Admitting: Family

## 2020-05-30 DIAGNOSIS — F32A Depression, unspecified: Secondary | ICD-10-CM

## 2020-05-30 LAB — CBC WITH DIFFERENTIAL/PLATELET
Basophils Absolute: 0 10*3/uL (ref 0.0–0.2)
Basos: 0 %
EOS (ABSOLUTE): 0 10*3/uL (ref 0.0–0.4)
Eos: 0 %
Hematocrit: 40.3 % (ref 34.0–46.6)
Hemoglobin: 13.7 g/dL (ref 11.1–15.9)
Immature Grans (Abs): 0 10*3/uL (ref 0.0–0.1)
Immature Granulocytes: 0 %
Lymphocytes Absolute: 1.9 10*3/uL (ref 0.7–3.1)
Lymphs: 33 %
MCH: 32.9 pg (ref 26.6–33.0)
MCHC: 34 g/dL (ref 31.5–35.7)
MCV: 97 fL (ref 79–97)
Monocytes Absolute: 0.5 10*3/uL (ref 0.1–0.9)
Monocytes: 9 %
Neutrophils Absolute: 3.4 10*3/uL (ref 1.4–7.0)
Neutrophils: 58 %
Platelets: 232 10*3/uL (ref 150–450)
RBC: 4.16 x10E6/uL (ref 3.77–5.28)
RDW: 12.1 % (ref 11.7–15.4)
WBC: 5.8 10*3/uL (ref 3.4–10.8)

## 2020-05-30 LAB — COMPREHENSIVE METABOLIC PANEL
ALT: 13 IU/L (ref 0–32)
AST: 14 IU/L (ref 0–40)
Albumin/Globulin Ratio: 2.2 (ref 1.2–2.2)
Albumin: 4.7 g/dL (ref 3.8–4.8)
Alkaline Phosphatase: 48 IU/L (ref 44–121)
BUN/Creatinine Ratio: 17 (ref 9–23)
BUN: 11 mg/dL (ref 6–24)
Bilirubin Total: 0.3 mg/dL (ref 0.0–1.2)
CO2: 22 mmol/L (ref 20–29)
Calcium: 9.6 mg/dL (ref 8.7–10.2)
Chloride: 102 mmol/L (ref 96–106)
Creatinine, Ser: 0.63 mg/dL (ref 0.57–1.00)
GFR calc Af Amer: 130 mL/min/{1.73_m2} (ref >59)
GFR calc non Af Amer: 113 mL/min/{1.73_m2} (ref >59)
Globulin, Total: 2.1 g/dL (ref 1.5–4.5)
Glucose: 80 mg/dL (ref 65–99)
Potassium: 3.9 mmol/L (ref 3.5–5.2)
Sodium: 139 mmol/L (ref 134–144)
Total Protein: 6.8 g/dL (ref 6.0–8.5)

## 2020-05-30 LAB — TSH: TSH: 0.056 u[IU]/mL — ABNORMAL LOW (ref 0.450–4.500)

## 2020-05-31 ENCOUNTER — Other Ambulatory Visit: Payer: Self-pay | Admitting: Family

## 2020-05-31 DIAGNOSIS — F900 Attention-deficit hyperactivity disorder, predominantly inattentive type: Secondary | ICD-10-CM

## 2020-05-31 NOTE — Telephone Encounter (Signed)
Circle back with pt and see how she is doing Please sch an appt with me

## 2020-05-31 NOTE — Telephone Encounter (Signed)
LMTCB

## 2020-05-31 NOTE — Telephone Encounter (Signed)
I called and left patient a detailed message that we just wanted to make sure that she was doing okay. I asked for her to let us know if she was comfortable waiting until the 22nd to be seen or if I needed to try move an appointment up. I asked if she would respond via mychart or call back.

## 2020-05-31 NOTE — Telephone Encounter (Signed)
Patient returned office phone call. Patient gives permission to leave a detailed message on phone. She is at work until 5:30pm today.

## 2020-06-01 ENCOUNTER — Telehealth: Payer: Self-pay | Admitting: Family

## 2020-06-01 DIAGNOSIS — F900 Attention-deficit hyperactivity disorder, predominantly inattentive type: Secondary | ICD-10-CM

## 2020-06-01 MED ORDER — AMPHETAMINE-DEXTROAMPHETAMINE 5 MG PO TABS: 5.0000 mg | ORAL_TABLET | Freq: Two times a day (BID) | ORAL | 0 refills | Status: DC | PRN

## 2020-06-01 MED ORDER — ALPRAZOLAM 1 MG PO TABS: 1.0000 mg | ORAL_TABLET | Freq: Two times a day (BID) | ORAL | 1 refills | Status: DC | PRN
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
## 2020-06-01 NOTE — Telephone Encounter (Signed)
Call pt I looked up patient on  Controlled Substances Reporting System PMP AWARE and saw no activity that raised concern of inappropriate use.   Refilled xanax and adderall ; adderall I refilled for 3 months   Please advise her to call endocrine regarding synthroid adjustment OR we can discuss here next week if she would like me to follow going forward and I can make adjustments.

## 2020-06-01 NOTE — Telephone Encounter (Signed)
LMTCB

## 2020-06-07 ENCOUNTER — Ambulatory Visit: Payer: 59 | Admitting: Family

## 2020-06-09 ENCOUNTER — Encounter: Payer: Self-pay | Admitting: Family

## 2020-06-09 ENCOUNTER — Other Ambulatory Visit: Payer: Self-pay

## 2020-06-09 ENCOUNTER — Ambulatory Visit (INDEPENDENT_AMBULATORY_CARE_PROVIDER_SITE_OTHER): Payer: 59 | Admitting: Family

## 2020-06-09 VITALS — BP 98/62 | HR 60 | Temp 98.4°F | Ht 63.0 in | Wt 113.4 lb

## 2020-06-09 DIAGNOSIS — E039 Hypothyroidism, unspecified: Secondary | ICD-10-CM | POA: Diagnosis not present

## 2020-06-09 DIAGNOSIS — F32A Depression, unspecified: Secondary | ICD-10-CM

## 2020-06-09 DIAGNOSIS — F900 Attention-deficit hyperactivity disorder, predominantly inattentive type: Secondary | ICD-10-CM

## 2020-06-09 DIAGNOSIS — F419 Anxiety disorder, unspecified: Secondary | ICD-10-CM

## 2020-06-09 MED ORDER — SERTRALINE HCL 50 MG PO TABS: 50.0000 mg | ORAL_TABLET | Freq: Every day | ORAL | 3 refills | Status: DC

## 2020-06-09 MED ORDER — LEVOTHYROXINE SODIUM 137 MCG PO TABS: 137.0000 ug | ORAL_TABLET | Freq: Every day | ORAL | 1 refills | Status: DC

## 2020-06-09 NOTE — Assessment & Plan Note (Signed)
Uncontrolled. Will stop celexa and start zoloft 50mg . Advised of risks of use of BZDs and reiterated that we will wean down from this medication once we gain control of anxiety. Continue xanax 1mg  bid for now. She understands importance of storing in safe place. Referral to psychiatry and counseling as well for further evaluation. Close follow up.

## 2020-06-09 NOTE — Patient Instructions (Signed)
Stop celexa; today was your last dose  Start zoloft tomorrow.   I will get back to you after speaking with Dr Kelton Pillar.   Referral to psychiatry and also for counseling  Let us know if you dont hear back within a week in regards to an appointment being scheduled.

## 2020-06-09 NOTE — Assessment & Plan Note (Addendum)
TSH 0.056. Patient very symptomatic. Decreased synthroid to 137 mcg from 162mcg and have sent Dr Kelton Pillar a staff message regarding this since she is out of the office. Advised patient to have TSH rechecked with endocrine at follow up next month.

## 2020-06-09 NOTE — Assessment & Plan Note (Signed)
Uncontrolled anxiety. Advised patient to HOLD adderall until anxiety better controlled.

## 2020-06-15 ENCOUNTER — Other Ambulatory Visit: Payer: Self-pay

## 2020-07-12 ENCOUNTER — Encounter: Payer: Self-pay | Admitting: Internal Medicine

## 2020-07-12 ENCOUNTER — Other Ambulatory Visit: Payer: Self-pay

## 2020-07-12 ENCOUNTER — Ambulatory Visit (INDEPENDENT_AMBULATORY_CARE_PROVIDER_SITE_OTHER): Payer: 59 | Admitting: Internal Medicine

## 2020-07-12 VITALS — BP 128/70 | HR 58 | Ht 63.0 in | Wt 112.4 lb

## 2020-07-12 DIAGNOSIS — E039 Hypothyroidism, unspecified: Secondary | ICD-10-CM | POA: Diagnosis not present

## 2020-07-12 LAB — TSH: TSH: 0.18 u[IU]/mL — ABNORMAL LOW (ref 0.35–4.50)

## 2020-07-12 MED ORDER — LEVOTHYROXINE SODIUM 125 MCG PO TABS: 125.0000 ug | ORAL_TABLET | Freq: Every day | ORAL | 1 refills | Status: DC

## 2020-07-12 MED ORDER — SYNTHROID 125 MCG PO TABS: 125.0000 ug | ORAL_TABLET | Freq: Every day | ORAL | 1 refills | Status: DC

## 2020-07-12 NOTE — Patient Instructions (Signed)
You are on Synthroid  - which is your thyroid hormone supplement. You MUST take this consistently.  You should take this first thing in the morning on an empty stomach with water. You should not take it with other medications. Wait 92mn to 1hr prior to eating. If you are taking any vitamins - please take these in the evening.   If you miss a dose, please take your missed dose the following day (double the dose for that day). You should have a pill box for ONLY Synthroid  on your bedside table to help you remember to take your medications.

## 2020-07-12 NOTE — Progress Notes (Signed)
Name: Rachel Alexander  MRN/ DOB: [id number], [birth year]    Age/ Sex: 40 y.o., female     PCP: Burnard Hawthorne, FNP   Reason for Endocrinology Evaluation: Hypothyroidism     Initial Endocrinology Clinic Visit: 10/09/2019    PATIENT IDENTIFIER: [name] is a 40 y.o., female with a past medical history of Depression, anxiety and hypothyroidism.  She has followed with [facility] since 10/09/2019 for consultative assistance with management of her hypothyroidism  HISTORICAL SUMMARY:  Pt was diagnosed with hyperthyroidism and goiter at age 40, was put on oral medications ( does not recall ) but over the years  she became hypothyroid requiring LT-4 replacement. She was initially on armour thyroid but was switched synthroid  In 08/2019.      Cousins with thyroid disease   SUBJECTIVE:    Today (07/12/2020):  Rachel Alexander is here for a follow up on hypothyroidism     Weight has been stable  Constipation has resolved  Has worsening anxiety  Hair loss has been noted   No local neck symptoms    Synthroid 137 mcg ,daily      HISTORY:  Past Medical History:  Past Medical History:  Diagnosis Date  . Colitis   . Depression   . Frequent headaches   . Hypothyroidism   . Migraines   . Pituitary tumor   . Suicidal intent    Past Surgical History:  Past Surgical History:  Procedure Laterality Date  . BREAST CYST ASPIRATION Right 2012  . COLONOSCOPY N/A 08/24/2019   Procedure: COLONOSCOPY;  Surgeon: Jonathon Bellows, MD;  Location: [facility] END[facility]COPY;  Service: Gastroenterology;  Laterality: N/A;  . TONSILLECTOMY    . TUBAL LIGATION     Social History:  reports that she quit smoking about 2 years ago. Her smoking use included cigarettes. She smoked 1.00 pack per day. She has never used smokeless tobacco. She reports current alcohol use. She reports that she does not use drugs. Family History:  Family History  Problem Relation Age of Onset  . Healthy  Mother   . Liver cancer Father   . Diabetes Sister   . Breast cancer Maternal Aunt      HOME MEDICATIONS: Allergies as of 07/12/2020   No Known Allergies     Medication List       Accurate as of July 12, 2020  7:49 AM. If you have any questions, ask your nurse or doctor.        ALPRAZolam 1 MG tablet Commonly known as: XANAX Take 1 tablet (1 mg total) by mouth 2 (two) times daily as needed for anxiety.   amphetamine-dextroamphetamine 5 MG tablet Commonly known as: Adderall Take 1 tablet (5 mg total) by mouth 2 (two) times daily as needed.   amphetamine-dextroamphetamine 5 MG tablet Commonly known as: Adderall Take 1 tablet (5 mg total) by mouth 2 (two) times daily as needed.   amphetamine-dextroamphetamine 5 MG tablet Commonly known as: Adderall Take 1 tablet (5 mg total) by mouth 2 (two) times daily as needed.   cholecalciferol 25 MCG (1000 UNIT) tablet Commonly known as: VITAMIN D3 Take 1,000 Units by mouth daily.   CRANBERRY PO Take by mouth.   ibuprofen 600 MG tablet Commonly known as: ADVIL Take 1 tablet (600 mg total) by mouth every 6 (six) hours as needed.   levothyroxine 137 MCG tablet Commonly known as: Synthroid Take 1 tablet (137 mcg total) by mouth daily before breakfast.   sertraline 50  MG tablet Commonly known as: ZOLOFT Take 1 tablet (50 mg total) by mouth at bedtime.   SUMAtriptan 25 MG tablet Commonly known as: IMITREX TAKE 1 TABLET (25 MG TOTAL) BY MOUTH ONCE FOR 1 DOSE. MAY REPEAT ONCE 2 HOURS LATER IF HEADACHE PERSISTS OR RECURS. MAX 2 DOSES.         OBJECTIVE:   PHYSICAL EXAM: VS: BP 128/70   Pulse (!) 58   Ht 5\' 3"  (1.6 m)   Wt 112 lb 6 oz (51 kg)   LMP 06/21/2020   SpO2 98%   BMI 19.91 kg/m    EXAM: General: Pt appears well and is in NAD  Neck: General: Supple without adenopathy. Thyroid: Thyroid size normal.  No goiter or nodules appreciated. No thyroid bruit.  Lungs: Clear with good BS bilat with no rales,  rhonchi, or wheezes  Heart: Auscultation: RRR.  Abdomen: Normoactive bowel sounds, soft, nontender, without masses or organomegaly palpable  Extremities:  BL LE: No pretibial edema normal ROM and strength.  Mental Status: Judgment, insight: Intact Orientation: Oriented to time, place, and person Mood and affect: No depression, anxiety, or agitation     DATA REVIEWED: Results for Rachel Alexander (MRN [id number]) as of 07/12/2020 12:58  Ref. Range 10/09/2019 09:24 12/26/2019 15:55 05/28/2020 14:54 07/12/2020 07:51  TSH Latest Ref Range: 0.35 - 4.50 uIU/mL 26.21 (H) 3.49 0.056 (L) 0.18 (L)    ASSESSMENT / PLAN / RECOMMENDATIONS:   Hypothyroidism:    - Pt with anxiety and hair loss  - Pt educated extensively on the correct way to take levothyroxine (first thing in the morning with water, 30 minutes before eating or taking other medications). - Pt encouraged to double dose the following day if she were to miss a dose given long half-life of levothyroxine. - TSH continues to be low, will reduce the dose as below     Medications  Stop Synthroid  137 mcg  Start Synthroid 125 mcg daily    F/U in 3 months     Signed electronically by: Mack Guise, MD  [facility]  [facility] [address] [place], [place] [address] Phone: [phone] FAX: [phone]      CC: Burnard Hawthorne, FNP [address] [place] [address] Phone: [phone]  Fax: [phone]   Return to Endocrinology clinic as below: Future Appointments  Date Time Provider [place]  07/21/2020 10:00 AM Arnett, Yvetta Coder, FNP [id number]

## 2020-07-21 ENCOUNTER — Telehealth: Payer: Self-pay

## 2020-07-21 ENCOUNTER — Ambulatory Visit: Payer: 59 | Admitting: Family

## 2020-07-21 NOTE — Telephone Encounter (Signed)
Tried to reach patient to let her know that appt has been cancelled but her voicemail isn't set up. Sent myChart message.

## 2020-07-23 ENCOUNTER — Other Ambulatory Visit: Payer: Self-pay | Admitting: Family

## 2020-07-23 DIAGNOSIS — R519 Headache, unspecified: Secondary | ICD-10-CM

## 2020-07-25 ENCOUNTER — Other Ambulatory Visit: Payer: Self-pay | Admitting: Family

## 2020-07-26 ENCOUNTER — Telehealth: Payer: Self-pay | Admitting: Family

## 2020-07-26 NOTE — Telephone Encounter (Signed)
Call pt   I have refilled your xanax  However I wanted to remind you that this is controlled substance.   In order for me to prescribe medication,  patients must be seen every 3 months.   Please make follow-up appointment this month for any further refills.    I looked up patient on [place] Controlled Substances Reporting System and saw no activity that raised concern of inappropriate use.   

## 2020-08-03 NOTE — Telephone Encounter (Signed)
Left a message to call back and schedule for a 3 month follow up.

## 2020-09-20 ENCOUNTER — Other Ambulatory Visit: Payer: Self-pay | Admitting: Family

## 2020-09-20 DIAGNOSIS — F32A Depression, unspecified: Secondary | ICD-10-CM

## 2020-09-28 ENCOUNTER — Telehealth: Payer: Self-pay | Admitting: Family

## 2020-09-28 DIAGNOSIS — F32A Depression, unspecified: Secondary | ICD-10-CM

## 2020-09-28 DIAGNOSIS — F419 Anxiety disorder, unspecified: Secondary | ICD-10-CM

## 2020-09-28 MED ORDER — ALPRAZOLAM 1 MG PO TABS: 1.0000 mg | ORAL_TABLET | Freq: Two times a day (BID) | ORAL | 1 refills | Status: DC | PRN

## 2020-09-28 NOTE — Telephone Encounter (Signed)
Pt called and informed of below. She stated that she had called [facility] who told her that she was out & I don't think patient knew that they send [place] a automated request. Pt stated that she does contact pharmacy first.

## 2020-09-28 NOTE — Telephone Encounter (Signed)
Call pt Rachel Alexander refilled Please ask her going forward to call [facility] to intiate electronic refill and please do not call. It is slower to refill that way She can also request via mychart  Please reiterate for me  I looked up patient on [place] Controlled Substances Reporting System PMP AWARE and saw no activity that raised concern of inappropriate use.

## 2020-09-28 NOTE — Addendum Note (Signed)
Addended by: Burnard Hawthorne on: 09/28/2020 10:16 AM   Modules accepted: Orders

## 2020-09-28 NOTE — Telephone Encounter (Signed)
Pt needs a refill on ALPRAZolam (XANAX) 1 MG tablet sent to [facility]  Pt is out of medication

## 2020-10-14 ENCOUNTER — Encounter: Payer: Self-pay | Admitting: Internal Medicine

## 2020-10-14 ENCOUNTER — Other Ambulatory Visit: Payer: Self-pay | Admitting: Family

## 2020-10-14 ENCOUNTER — Ambulatory Visit (INDEPENDENT_AMBULATORY_CARE_PROVIDER_SITE_OTHER): Payer: 59 | Admitting: Internal Medicine

## 2020-10-14 ENCOUNTER — Other Ambulatory Visit: Payer: Self-pay

## 2020-10-14 VITALS — BP 100/70 | HR 69 | Ht 63.0 in | Wt 116.0 lb

## 2020-10-14 DIAGNOSIS — E039 Hypothyroidism, unspecified: Secondary | ICD-10-CM

## 2020-10-14 DIAGNOSIS — F419 Anxiety disorder, unspecified: Secondary | ICD-10-CM

## 2020-10-14 DIAGNOSIS — F32A Depression, unspecified: Secondary | ICD-10-CM

## 2020-10-14 LAB — TSH: TSH: 27.46 u[IU]/mL — ABNORMAL HIGH (ref 0.35–4.50)

## 2020-10-14 NOTE — Progress Notes (Signed)
Name: Rachel Alexander  MRN/ DOB: [id number], 1981/02/23    Age/ Sex: 40 y.o., female     PCP: Burnard Hawthorne, FNP   Reason for Endocrinology Evaluation: Hypothyroidism     Initial Endocrinology Clinic Visit: 10/09/2019    PATIENT IDENTIFIER: Rachel Alexander is a 40 y.o., female with a past medical history of Depression, anxiety and hypothyroidism.  She has followed with [facility] since 10/09/2019 for consultative assistance with management of her hypothyroidism  HISTORICAL SUMMARY:  Pt was diagnosed with hyperthyroidism and goiter at age 52, was put on oral medications ( does not recall ) but over the years  she became hypothyroid requiring LT-4 replacement. She was initially on armour thyroid but was switched synthroid  In 08/2019.      Cousins with thyroid disease   SUBJECTIVE:    Today (10/14/2020):  Rachel Alexander is here for a follow up on hypothyroidism .    Weight has been fluctuating  Hair is thin and brittle - pt to use hair masks  Has occasional constipation - pt to use fiber supplements  Anxiety improving   No local neck symptoms    Synthroid 125 mcg ,daily      HISTORY:  Past Medical History:  Past Medical History:  Diagnosis Date  . Colitis   . Depression   . Frequent headaches   . Hypothyroidism   . Migraines   . Pituitary tumor   . Suicidal intent    Past Surgical History:  Past Surgical History:  Procedure Laterality Date  . BREAST CYST ASPIRATION Right 2012  . COLONOSCOPY N/A 08/24/2019   Procedure: COLONOSCOPY;  Surgeon: Jonathon Bellows, MD;  Location: [facility] ENDOSCOPY;  Service: Gastroenterology;  Laterality: N/A;  . TONSILLECTOMY    . TUBAL LIGATION     Social History:  reports that she quit smoking about 3 years ago. Her smoking use included cigarettes. She smoked 1.00 pack per day. She has never used smokeless tobacco. She reports current alcohol use. She reports that she does not use drugs. Family History:  Family  History  Problem Relation Age of Onset  . Healthy Mother   . Liver cancer Father   . Diabetes Sister   . Breast cancer Maternal Aunt      HOME MEDICATIONS: Allergies as of 10/14/2020   No Known Allergies     Medication List       Accurate as of October 14, 2020  7:49 AM. If you have any questions, ask your nurse or doctor.        STOP taking these medications   amphetamine-dextroamphetamine 5 MG tablet Commonly known as: Adderall Stopped by: Dorita Sciara, MD   sertraline 50 MG tablet Commonly known as: ZOLOFT Stopped by: Dorita Sciara, MD     TAKE these medications   ALPRAZolam 1 MG tablet Commonly known as: XANAX Take 1 tablet (1 mg total) by mouth 2 (two) times daily as needed for anxiety.   cholecalciferol 25 MCG (1000 UNIT) tablet Commonly known as: VITAMIN D3 Take 1,000 Units by mouth daily.   citalopram 40 MG tablet Commonly known as: CELEXA Take 40 mg by mouth daily.   CRANBERRY PO Take by mouth.   ibuprofen 600 MG tablet Commonly known as: ADVIL Take 1 tablet (600 mg total) by mouth every 6 (six) hours as needed.   SUMAtriptan 25 MG tablet Commonly known as: IMITREX TAKE 1 TABLET (25 MG TOTAL) BY MOUTH ONCE FOR 1 DOSE. MAY REPEAT ONCE  2 HOURS LATER IF HEADACHE PERSISTS OR RECURS. MAX 2 DOSES.   Synthroid 125 MCG tablet Generic drug: levothyroxine Take 1 tablet (125 mcg total) by mouth daily before breakfast.         OBJECTIVE:   PHYSICAL EXAM: VS: BP 100/70   Pulse 69   Ht 5\' 3"  (1.6 m)   Wt 116 lb (52.6 kg)   LMP 10/01/2020   SpO2 99%   BMI 20.55 kg/m    EXAM: General: Pt appears well and is in NAD  Neck: General: Supple without adenopathy. Thyroid: Thyroid size normal.  No goiter or nodules appreciated.  Lungs: Clear with good BS bilat with no rales, rhonchi, or wheezes  Heart: Auscultation: RRR.  Abdomen: Normoactive bowel sounds, soft, nontender, without masses or organomegaly palpable  Extremities:  BL LE: No  pretibial edema normal ROM and strength.  Mental Status: Judgment, insight: Intact Orientation: Oriented to time, place, and person Mood and affect: No depression, anxiety, or agitation     DATA REVIEWED:   Results for Rachel, Alexander (MRN [id number]) as of 10/15/2020 11:54  Ref. Range 10/14/2020 07:57  TSH Latest Ref Range: 0.35 - 4.50 uIU/mL 27.46 (H)     ASSESSMENT / PLAN / RECOMMENDATIONS:   Hypothyroidism:    - Pt is clinically euthyroid  - Pt educated extensively on the correct way to take levothyroxine (first thing in the morning with water, 30 minutes before eating or taking other medications). - Pt encouraged to double dose the following day if she were to miss a dose given long half-life of levothyroxine. - TSH is high, it increased from 0.18 uIU/mL on 137 mcg daily to 27.46 uIU/mL on 125 mcg daily - Will make the following changes    Medications   Increase Synthroid 125 mcg, TWO tablets on Sundays and 1 tablet the rest of the week      F/U in 4 months  Labs in 8 weeks   Addendum: discussed results with the pt on 10/15/2020   Signed electronically by: Mack Guise, MD  [facility]  [facility] [address] [place], [place] [address] Phone: [phone] FAX: [phone]      CC: Burnard Hawthorne, FNP [address] [place] [address] Phone: [phone]  Fax: [phone]   Return to Endocrinology clinic as below: No future appointments.

## 2020-10-15 MED ORDER — SYNTHROID 125 MCG PO TABS
125.0000 ug | ORAL_TABLET | ORAL | 1 refills | Status: DC
Start: 2020-10-15 — End: 2021-02-18

## 2020-11-22 ENCOUNTER — Other Ambulatory Visit: Payer: Self-pay | Admitting: Family

## 2020-11-22 ENCOUNTER — Telehealth: Payer: Self-pay | Admitting: Family

## 2020-11-22 DIAGNOSIS — F32A Depression, unspecified: Secondary | ICD-10-CM

## 2020-11-22 DIAGNOSIS — F419 Anxiety disorder, unspecified: Secondary | ICD-10-CM

## 2020-11-22 DIAGNOSIS — R519 Headache, unspecified: Secondary | ICD-10-CM

## 2020-11-22 MED ORDER — ALPRAZOLAM 1 MG PO TABS: 1.0000 mg | ORAL_TABLET | Freq: Two times a day (BID) | ORAL | 1 refills | Status: DC | PRN

## 2020-11-22 NOTE — Telephone Encounter (Signed)
Patient aware.

## 2020-11-22 NOTE — Telephone Encounter (Signed)
RX Refill:xanax Last Seen:06-09-20 Last ordered:09-28-20

## 2020-11-22 NOTE — Telephone Encounter (Signed)
Call pt  I will have to wean pt off xanax as I have not seen her in 6 months im so sorry but she never made a follow up.   It is policy to keep her safe on a controlled substance  I will have to decrease  xanax from 1mg  BID to 1mg  daily and then wean off from there.   Please ensure pt is aware and then I will send in new, decreased xanax dose.

## 2020-11-22 NOTE — Telephone Encounter (Signed)
Call pt  I do understand and know those sorts of things happen  I went ahead and refilled at prior dose but really going forward she needs to schedule 3 month follow ups prior to calling in for refills.   Look forward to seeing her in July

## 2020-11-22 NOTE — Telephone Encounter (Signed)
Patient apologized for missing last appointment. She said that she did not realize that she had done so & plans to still follow with you. She was okay with decreased dose until she is seen & I have scheduled for 12/31/20.

## 2020-12-09 ENCOUNTER — Other Ambulatory Visit: Payer: Self-pay

## 2020-12-09 ENCOUNTER — Other Ambulatory Visit (INDEPENDENT_AMBULATORY_CARE_PROVIDER_SITE_OTHER): Payer: 59

## 2020-12-09 DIAGNOSIS — E039 Hypothyroidism, unspecified: Secondary | ICD-10-CM | POA: Diagnosis not present

## 2020-12-09 LAB — TSH: TSH: 11.25 u[IU]/mL — ABNORMAL HIGH (ref 0.35–4.50)

## 2020-12-29 ENCOUNTER — Ambulatory Visit
Admission: EM | Admit: 2020-12-29 | Discharge: 2020-12-29 | Disposition: A | Discharge status: Home / Self Care | Payer: 59 | Attending: Emergency Medicine | Admitting: Emergency Medicine

## 2020-12-29 ENCOUNTER — Other Ambulatory Visit: Payer: Self-pay

## 2020-12-29 ENCOUNTER — Encounter: Payer: Self-pay | Admitting: Emergency Medicine

## 2020-12-29 DIAGNOSIS — R1013 Epigastric pain: Secondary | ICD-10-CM | POA: Diagnosis not present

## 2020-12-29 DIAGNOSIS — N898 Other specified noninflammatory disorders of vagina: Secondary | ICD-10-CM | POA: Diagnosis not present

## 2020-12-29 DIAGNOSIS — R300 Dysuria: Secondary | ICD-10-CM

## 2020-12-29 DIAGNOSIS — R42 Dizziness and giddiness: Secondary | ICD-10-CM | POA: Diagnosis not present

## 2020-12-29 DIAGNOSIS — R519 Headache, unspecified: Secondary | ICD-10-CM | POA: Diagnosis present

## 2020-12-29 LAB — POCT URINE PREGNANCY: Preg Test, Ur: NEGATIVE

## 2020-12-29 LAB — POCT URINALYSIS DIP (MANUAL ENTRY)
Bilirubin, UA: NEGATIVE
Blood, UA: NEGATIVE
Glucose, UA: NEGATIVE mg/dL
Ketones, POC UA: NEGATIVE mg/dL
Leukocytes, UA: NEGATIVE
Nitrite, UA: NEGATIVE
Protein Ur, POC: NEGATIVE mg/dL
Spec Grav, UA: <=1.005 — AB
Urobilinogen, UA: 0.2 U/dL
pH, UA: 6

## 2020-12-29 MED ORDER — OMEPRAZOLE 20 MG PO CPDR: 20.0000 mg | DELAYED_RELEASE_CAPSULE | Freq: Every day | ORAL | 0 refills | Status: DC

## 2020-12-29 NOTE — Discharge Instructions (Addendum)
Your urine does not show signs of infection.  Your urine pregnancy test is negative.  Your vaginal tests are pending.  We will call you if they indicate the need for treatment.  Take the omeprazole as directed.    Schedule an appointment with your primary care provider soon as possible.    Go to the emergency department if you have acute worsening symptoms.

## 2020-12-29 NOTE — ED Provider Notes (Signed)
[facility] Palau    CSN: [id number] Arrival date & time: 12/29/20  1053      History   Chief Complaint Chief Complaint  Patient presents with   Dysuria   Dizziness    HPI Rachel Alexander is a 40 y.o. female.  Patient presents with 1 day history of dizziness and headache.  She also reports 2-week history of dysuria and scant malodorous vaginal discharge.  She also reports epigastric abdominal pain which is worse after eating.  She denies fever, chills, ear pain, sore throat, cough, shortness of breath, chest pain, vomiting, diarrhea, constipation, hematuria, pelvic pain, back pain, or other symptoms.  Her medical history includes colitis, frequent headaches, ADHD, hypothyroidism, depression, anxiety.  The history is provided by the patient and medical records.   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      Home Medications    Prior to Admission medications   Medication Sig Start Date End Date Taking? Authorizing Provider  omeprazole (PRILOSEC) 20 MG capsule Take 1 capsule (20 mg  total) by mouth daily. 12/29/20  Yes Sharion Balloon, NP  ALPRAZolam Duanne Moron) 1 MG tablet Take 1 tablet (1 mg total) by mouth 2 (two) times daily as needed for anxiety. 11/22/20   Burnard Hawthorne, FNP  cholecalciferol (VITAMIN D3) 25 MCG (1000 UNIT) tablet Take 1,000 Units by mouth daily.    [provider]  citalopram (CELEXA) 40 MG tablet Take 40 mg by mouth daily.    [provider]  CRANBERRY PO Take by mouth.    [provider]  ibuprofen (ADVIL) 600 MG tablet Take 1 tablet (600 mg total) by mouth every 6 (six) hours as needed. 04/29/20   Sharion Balloon, NP  SUMAtriptan (IMITREX) 25 MG tablet TAKE 1 TABLET (25 MG TOTAL) BY MOUTH ONCE FOR 1 DOSE. MAY REPEAT ONCE 2 HOURS LATER IF HEADACHE PERSISTS OR RECURS. MAX 2 DOSES. 11/22/20 11/22/20  Burnard Hawthorne, FNP  SYNTHROID 125 MCG tablet Take 1 tablet (125 mcg total) by mouth as directed. 10/15/20   Shamleffer, Melanie Crazier, MD  dicyclomine (BENTYL) 20 MG tablet Take 1 tablet (20 mg total) by mouth 2 (two) times daily. 08/06/14 09/25/14  Antonietta Breach, PA-C    Family History Family History  Problem Relation Age of Onset   Healthy Mother    Liver cancer Father    Diabetes Sister    Breast cancer Maternal Aunt     Social History Social History   Tobacco Use   Smoking status: Former  Packs/day: 1.00    Pack years: 0.00    Types: Cigarettes    Quit date: 10/06/2017    Years since quitting: 3.2   Smokeless tobacco: Never  Vaping Use   Vaping Use: Every day  Substance Use Topics   Alcohol use: Yes    Comment: occ   Drug use: No     Allergies   Patient has no known allergies.   Review of Systems Review of Systems  Constitutional:  Negative for chills and fever.  HENT:  Negative for ear pain and sore throat.   Respiratory:  Negative for cough and shortness of breath.   Cardiovascular:  Negative for chest pain and palpitations.  Gastrointestinal:  Positive for abdominal pain. Negative for constipation,  diarrhea and vomiting.  Genitourinary:  Positive for dysuria and vaginal discharge. Negative for flank pain, hematuria and pelvic pain.  Skin:  Negative for color change and rash.  Neurological:  Positive for dizziness and headaches. Negative for syncope, weakness and numbness.  All other systems reviewed and are negative.   Physical Exam Triage Vital Signs ED Triage Vitals  Enc Vitals Group     BP      Pulse      Resp      Temp      Temp src      SpO2      Weight      Height      Head Circumference      Peak Flow      Pain Score      Pain Loc      Pain Edu?      Excl. in [place]?    No data found.  Updated Vital Signs BP 113/69 (BP Location: Left Arm)   Pulse 63   Temp 98.7 F (37.1 C) (Oral)   Resp 18   Ht 5\' 2"  (1.575 m)   Wt 115 lb (52.2 kg)   LMP 11/28/2020   SpO2 96%   BMI 21.03 kg/m   Visual Acuity Right Eye Distance:   Left Eye Distance:   Bilateral Distance:    Right Eye Near:   Left Eye Near:    Bilateral Near:     Physical Exam   UC Treatments / Results  Labs (all labs ordered are listed, but only abnormal results are displayed) Labs Reviewed  POCT URINALYSIS DIP (MANUAL ENTRY) - Abnormal; Notable for the following components:      Result Value   Color, UA light yellow (*)    Spec Grav, UA <=1.005 (*)    All other components within normal limits  POCT URINE PREGNANCY  CERVICOVAGINAL ANCILLARY ONLY    EKG   Radiology No results found.  Procedures Procedures (including critical care time)  Medications Ordered in UC Medications - No data to display  Initial Impression / Assessment and Plan / UC Course  I have reviewed the triage vital signs and the nursing notes.  Pertinent labs & imaging results that were available during my care of the patient were reviewed by me and considered in my medical decision making (see chart for details).  Epigastric pain, dysuria, vaginal discharge, dizziness, headache.  Patient is well-appearing and  her exam is reassuring.  Vital signs are stable.  Urine does not show signs of infection.  Urine pregnancy negative.  Patient obtained vaginal self swab for testing.  Discussed that we will call her if the test results indicate that she needs treatment.  Patient declines COVID test.  Treating epigastric pain with omeprazole daily x14 days.  Instructed patient to schedule a follow-up appointment with her PCP; she states she has an appointment already scheduled on 12/31/2020.  ED precautions discussed.  Education provided on abdominal pain, dysuria, dizziness, headache.  Patient agrees to plan of care.   Final Clinical Impressions(s) / UC Diagnoses   Final diagnoses:  Epigastric pain  Dysuria  Vaginal discharge  Dizziness  Acute nonintractable headache, unspecified headache type     Discharge Instructions      Your urine does not show signs of infection.  Your urine pregnancy test is negative.  Your vaginal tests are pending.  We will call you if they indicate the need for treatment.  Take the omeprazole as directed.    Schedule an appointment with your primary care provider soon as possible.    Go to the emergency department if you have acute worsening symptoms.         ED Prescriptions     Medication Sig Dispense Auth. Provider   omeprazole (PRILOSEC) 20 MG capsule Take 1 capsule (20 mg total) by mouth daily. 14 capsule Sharion Balloon, NP      PDMP not reviewed this encounter.   Sharion Balloon, NP 12/29/20 1147

## 2020-12-29 NOTE — ED Triage Notes (Signed)
Patient in office c/o dizziness started yesterday no nausea, vomiting. Stated she has a headache today. Also states for the last 2weeks having burning and  lite vaginal  discharge with odor.  OTC: Ibuprofen  LMP: 11/28/20

## 2020-12-30 LAB — CERVICOVAGINAL ANCILLARY ONLY
Bacterial Vaginitis (gardnerella): POSITIVE — AB
Candida Glabrata: NEGATIVE
Candida Vaginitis: NEGATIVE
Chlamydia: NEGATIVE
Comment: NEGATIVE
Comment: NEGATIVE
Comment: NEGATIVE
Comment: NEGATIVE
Comment: NEGATIVE
Comment: NORMAL
Neisseria Gonorrhea: NEGATIVE
Trichomonas: NEGATIVE

## 2020-12-31 ENCOUNTER — Encounter: Payer: Self-pay | Admitting: Family

## 2020-12-31 ENCOUNTER — Telehealth (HOSPITAL_COMMUNITY): Payer: Self-pay | Admitting: Emergency Medicine

## 2020-12-31 ENCOUNTER — Ambulatory Visit (INDEPENDENT_AMBULATORY_CARE_PROVIDER_SITE_OTHER): Payer: 59 | Admitting: Family

## 2020-12-31 ENCOUNTER — Other Ambulatory Visit: Payer: Self-pay

## 2020-12-31 VITALS — BP 88/56 | HR 64 | Temp 98.6°F | Ht 62.0 in | Wt 114.0 lb

## 2020-12-31 DIAGNOSIS — G43909 Migraine, unspecified, not intractable, without status migrainosus: Secondary | ICD-10-CM | POA: Diagnosis not present

## 2020-12-31 DIAGNOSIS — F419 Anxiety disorder, unspecified: Secondary | ICD-10-CM

## 2020-12-31 DIAGNOSIS — Z1231 Encounter for screening mammogram for malignant neoplasm of breast: Secondary | ICD-10-CM

## 2020-12-31 DIAGNOSIS — F32A Depression, unspecified: Secondary | ICD-10-CM

## 2020-12-31 DIAGNOSIS — Z8619 Personal history of other infectious and parasitic diseases: Secondary | ICD-10-CM | POA: Diagnosis not present

## 2020-12-31 MED ORDER — METRONIDAZOLE 500 MG PO TABS
500.0000 mg | ORAL_TABLET | Freq: Two times a day (BID) | ORAL | 0 refills | Status: DC
Start: 2020-12-31 — End: 2021-04-04

## 2020-12-31 MED ORDER — VALACYCLOVIR HCL 500 MG PO TABS
ORAL_TABLET | ORAL | 2 refills | Status: DC
Start: 2020-12-31 — End: 2021-03-15

## 2020-12-31 NOTE — Progress Notes (Signed)
Subjective:    Patient ID: Rachel Alexander, female    DOB: 12/31/1980, 40 y.o.   MRN: [id number]  CC: Rachel Alexander is a 40 y.o. female who presents today for follow up.   HPI: Complains of decrease pleasure, sleeping more and decreased appetite.   Tried zoloft, effexor, prozac in the past.  She is compliant with  xanax 1mg  QD prn, celexa 40mg   Anxiety has improved.   Goes to bed 730 - 930pm, get up at 430am. Sleep is restorative.   Reports that she has h/o bipolar and hospitalized in her teens. She has been on ability in the past. She was following with psychiatry in the past.   She asks for refill of valtrex for episodic use when she has genital sore.       No thoughts of hurting herself or anyone else.   Denies a period of having more energy than usual, didn't require sleep, spending more money than usual and got into trouble, a time when so hyper got into trouble, more irritated that you started arguments.  Denies that these acts caused trouble at work, financially, or with family or personal relationships.    Separated from husband 05/2020. She feels safe.   HA are well controlled. HA similar to prior HA. Occur only prior to menses. Very rare use imitrex, 2 x per month.     ADHD - she is not on adderall.   Hypothyroid- following with Rachel Alexander. Compliant with synthroid.    HISTORY:  Past Medical History:  Diagnosis Date   Colitis    Depression    Frequent headaches    Hypothyroidism    Migraines    Pituitary tumor    Suicidal intent    Past Surgical History:  Procedure Laterality Date   BREAST CYST ASPIRATION Right 2012   COLONOSCOPY N/A 08/24/2019   Procedure: COLONOSCOPY;  Surgeon: Jonathon Bellows, MD;  Location: [facility] ENDOSCOPY;  Service: Gastroenterology;  Laterality: N/A;   TONSILLECTOMY     TUBAL LIGATION     Family History  Problem Relation Age of Onset   Healthy Mother    Liver cancer Father    Diabetes Sister    Breast cancer Maternal Aunt      Allergies: Patient has no known allergies. Current Outpatient Medications on File Prior to Visit  Medication Sig Dispense Refill   ALPRAZolam (XANAX) 1 MG tablet Take 1 tablet (1 mg total) by mouth 2 (two) times daily as needed for anxiety. 60 tablet 1   cholecalciferol (VITAMIN D3) 25 MCG (1000 UNIT) tablet Take 1,000 Units by mouth daily.     citalopram (CELEXA) 40 MG tablet Take 40 mg by mouth daily.     CRANBERRY PO Take by mouth.     ibuprofen (ADVIL) 600 MG tablet Take 1 tablet (600 mg total) by mouth every 6 (six) hours as needed. 30 tablet 0   omeprazole (PRILOSEC) 20 MG capsule Take 1 capsule (20 mg total) by mouth daily. 14 capsule 0   SYNTHROID 125 MCG tablet Take 1 tablet (125 mcg total) by mouth as directed. 104 tablet 1   SUMAtriptan (IMITREX) 25 MG tablet TAKE 1 TABLET (25 MG TOTAL) BY MOUTH ONCE FOR 1 DOSE. MAY REPEAT ONCE 2 HOURS LATER IF HEADACHE PERSISTS OR RECURS. MAX 2 DOSES. 9 tablet 1   [DISCONTINUED] dicyclomine (BENTYL) 20 MG tablet Take 1 tablet (20 mg total) by mouth 2 (two) times daily. 20 tablet 0   No current facility-administered medications on  file prior to visit.    Social History   Tobacco Use   Smoking status: Former    Packs/day: 1.00    Types: Cigarettes    Quit date: 10/06/2017    Years since quitting: 3.2   Smokeless tobacco: Never  Vaping Use   Vaping Use: Every day  Substance Use Topics   Alcohol use: Yes    Comment: occ   Drug use: No    Review of Systems  Constitutional:  Negative for chills, fatigue and fever.  Respiratory:  Negative for cough.   Cardiovascular:  Negative for chest pain and palpitations.  Gastrointestinal:  Negative for nausea and vomiting.  Psychiatric/Behavioral:  Positive for sleep disturbance. Negative for suicidal ideas. The patient is not nervous/anxious.      Objective:    BP (!) 88/56 (BP Location: Left Arm, Patient Position: Sitting, Cuff Size: Normal)   Pulse 64   Temp 98.6 F (37 C) (Oral)   Ht  5\' 2"  (1.575 m)   Wt 114 lb (51.7 kg)   SpO2 99%   BMI 20.85 kg/m  BP Readings from Last 3 Encounters:  12/31/20 (!) 88/56  12/29/20 113/69  10/14/20 100/70   Wt Readings from Last 3 Encounters:  12/31/20 114 lb (51.7 kg)  12/29/20 115 lb (52.2 kg)  10/14/20 116 lb (52.6 kg)    Physical Exam Vitals reviewed.  Constitutional:      Appearance: She is well-developed.  Eyes:     Conjunctiva/sclera: Conjunctivae normal.  Cardiovascular:     Rate and Rhythm: Normal rate and regular rhythm.     Pulses: Normal pulses.     Heart sounds: Normal heart sounds.  Pulmonary:     Effort: Pulmonary effort is normal.     Breath sounds: Normal breath sounds. No wheezing, rhonchi or rales.  Skin:    General: Skin is warm and dry.  Neurological:     Mental Status: She is alert.  Psychiatric:        Speech: Speech normal.        Behavior: Behavior normal.        Thought Content: Thought content normal.       Assessment & Plan:   Problem List Items Addressed This Visit       Other   Anxiety and depression    Uncontrolled,stable. Discussed importance of formal diagnosis , re evaluation as I learned today of h/o bipolar. Concern for being on SSRI as she may require antipsychotic as she reports abilify in the past. Referral to psychiatry and counseling. Continue lexapro, xanax for now.        Relevant Orders   Ambulatory referral to Psychiatry   Ambulatory referral to Psychology   H/O herpes genitalis    Chronic, stable. Use valtrex 500mg  as needed.        Relevant Medications   valACYclovir (VALTREX) 500 MG tablet   Other Visit Diagnoses     Encounter for screening mammogram for malignant neoplasm of breast    -  Primary   Relevant Orders   MM 3D SCREEN BREAST BILATERAL        I am having Rachel Alexander "Rachel Alexander" start on valACYclovir. I am also having her maintain her cholecalciferol, CRANBERRY PO, ibuprofen, citalopram, Synthroid, SUMAtriptan, ALPRAZolam, omeprazole,  and metroNIDAZOLE.   Meds ordered this encounter  Medications   valACYclovir (VALTREX) 500 MG tablet    Sig: Take 500 mg PO x q 12h x 3 days. ASAP w/in 24h of sx onset.  Dispense:  18 tablet    Refill:  2    Order Specific Question:   Supervising Provider    Answer:   Crecencio Mc [[id number]]    Return precautions given.   Risks, benefits, and alternatives of the medications and treatment plan prescribed today were discussed, and patient expressed understanding.   Education regarding symptom management and diagnosis given to patient on AVS.  Continue to follow with Burnard Hawthorne, FNP for routine health maintenance.   Rachel Alexander and I agreed with plan.   Mable Paris, FNP

## 2020-12-31 NOTE — Assessment & Plan Note (Signed)
Stable, chronic.Contine imitex 25mg .

## 2020-12-31 NOTE — Assessment & Plan Note (Signed)
Chronic, stable. Use valtrex 500mg  as needed.

## 2020-12-31 NOTE — Patient Instructions (Signed)
Referral to psychiatry, counseling Let us know if you dont hear back within a week in regards to an appointment being scheduled.    Nice to see you, always

## 2020-12-31 NOTE — Assessment & Plan Note (Signed)
Uncontrolled,stable. Discussed importance of formal diagnosis , re evaluation as I learned today of h/o bipolar. Concern for being on SSRI as she may require antipsychotic as she reports abilify in the past. Referral to psychiatry and counseling. Continue lexapro, xanax for now.

## 2021-01-18 ENCOUNTER — Other Ambulatory Visit: Payer: Self-pay | Admitting: Family

## 2021-01-18 DIAGNOSIS — R519 Headache, unspecified: Secondary | ICD-10-CM

## 2021-01-18 DIAGNOSIS — F32A Depression, unspecified: Secondary | ICD-10-CM

## 2021-01-18 DIAGNOSIS — F419 Anxiety disorder, unspecified: Secondary | ICD-10-CM

## 2021-02-07 ENCOUNTER — Encounter: Payer: Self-pay | Admitting: Family

## 2021-02-14 ENCOUNTER — Other Ambulatory Visit: Payer: Self-pay

## 2021-02-14 ENCOUNTER — Ambulatory Visit (INDEPENDENT_AMBULATORY_CARE_PROVIDER_SITE_OTHER): Payer: 59 | Admitting: Internal Medicine

## 2021-02-14 ENCOUNTER — Encounter: Payer: Self-pay | Admitting: Internal Medicine

## 2021-02-14 VITALS — BP 102/64 | HR 71 | Ht 62.0 in | Wt 111.8 lb

## 2021-02-14 DIAGNOSIS — E039 Hypothyroidism, unspecified: Secondary | ICD-10-CM

## 2021-02-14 LAB — TSH: TSH: 5.62 u[IU]/mL — ABNORMAL HIGH (ref 0.35–5.50)

## 2021-02-14 NOTE — Progress Notes (Signed)
Name: [name]  MRN/ DOB: [id number], 11-18-1980    Age/ Sex: 40 y.o., female     PCP: Burnard Hawthorne, FNP   Reason for Endocrinology Evaluation: Hypothyroidism     Initial Endocrinology Clinic Visit: 10/09/2019    PATIENT IDENTIFIER: Rachel Alexander is a 40 y.o., female with a past medical history of Depression, anxiety and hypothyroidism.  She has followed with [facility] since 10/09/2019 for consultative assistance with management of her hypothyroidism  HISTORICAL SUMMARY:  Pt was diagnosed with hyperthyroidism and goiter at age 43, was put on oral medications ( does not recall ) but over the years  she became hypothyroid requiring LT-4 replacement. She was initially on armour thyroid but was switched synthroid  In 08/2019.          Cousins with thyroid disease    SUBJECTIVE:    Today (02/14/2021):  [name] is here for a follow up on hypothyroidism .   She has been noted with weight loss  Denies constipation or diarrhea  Has occasional hand tremors and palpitations  No local neck symptoms  She takes Collagen but no biotin   Synthroid 125 mcg ,Two tabs on Sundays and 1 tab rest of the week    Was recently evaluated by PCP and referred to psych for evaluation of depression and anxiety      HISTORY:  Past Medical History:  Past Medical History:  Diagnosis Date  . Colitis   . Depression   . Frequent headaches   . Hypothyroidism   . Migraines   . Pituitary tumor   . Suicidal intent    Past Surgical History:  Past Surgical History:  Procedure Laterality Date  . BREAST CYST ASPIRATION Right 2012  . COLONOSCOPY N/A 08/24/2019   Procedure: COLONOSCOPY;  Surgeon: Jonathon Bellows, MD;  Location: [facility] ENDOSCOPY;  Service: Gastroenterology;  Laterality: N/A;  . TONSILLECTOMY    . TUBAL LIGATION     Social History:  reports that she quit smoking about 3 years ago. Her smoking use included cigarettes. She smoked an average of 1 pack per day.  She has never used smokeless tobacco. She reports current alcohol use. She reports that she does not use drugs. Family History:  Family History  Problem Relation Age of Onset  . Healthy Mother   . Liver cancer Father   . Diabetes Sister   . Breast cancer Maternal Aunt      HOME MEDICATIONS: Allergies as of 02/14/2021   No Known Allergies      Medication List        Accurate as of February 14, 2021 11:23 AM. If you have any questions, ask your nurse or doctor.          ALPRAZolam 1 MG tablet Commonly known as: XANAX TAKE 1 TABLET BY MOUTH 2 TIMES DAILY AS NEEDED FOR ANXIETY. (INS PREMIUM NOT PAID)   cholecalciferol 25 MCG (1000 UNIT) tablet Commonly known as: VITAMIN D3 Take 1,000 Units by mouth daily.   citalopram 40 MG tablet Commonly known as: CELEXA Take 40 mg by mouth daily.   CRANBERRY PO Take by mouth.   ibuprofen 600 MG tablet Commonly known as: ADVIL Take 1 tablet (600 mg total) by mouth every 6 (six) hours as needed.   metroNIDAZOLE 500 MG tablet Commonly known as: FLAGYL Take 1 tablet (500 mg total) by mouth 2 (two) times daily.   omeprazole 20 MG capsule Commonly known as: PRILOSEC Take 1 capsule (20 mg  total) by mouth daily.   SUMAtriptan 25 MG tablet Commonly known as: IMITREX TAKE 1 TAB BY MOUTH DAILY. MAY REPEAT ONCE 2 HRS LATER IF HEADACHE PERSISTS OR RECURS. MAX 2 DOSES.   Synthroid 125 MCG tablet Generic drug: levothyroxine Take 1 tablet (125 mcg total) by mouth as directed.   valACYclovir 500 MG tablet Commonly known as: Valtrex Take 500 mg PO x q 12h x 3 days. ASAP w/in 24h of sx onset.          OBJECTIVE:   PHYSICAL EXAM: VS: BP 102/64 (BP Location: Right Arm, Patient Position: Sitting, Cuff Size: Small)   Pulse 71   Ht '5\' 2"'$  (1.575 m)   Wt 111 lb 12.8 oz (50.7 kg)   SpO2 98%   BMI 20.45 kg/m    EXAM: General: Pt appears well and is in NAD  Neck: General: Supple without adenopathy. Thyroid: Thyroid size normal.   No goiter or nodules appreciated.  Lungs: Clear with good BS bilat with no rales, rhonchi, or wheezes  Heart: Auscultation: RRR.  Abdomen: Normoactive bowel sounds, soft, nontender, without masses or organomegaly palpable  Extremities:  BL LE: No pretibial edema normal ROM and strength.  Mental Status: Judgment, insight: Intact Orientation: Oriented to time, place, and person Mood and affect: No depression, anxiety, or agitation     DATA REVIEWED:  Results for KATHEREN, MOWELL" (MRN [id number]) as of 02/14/2021 16:07  Ref. Range 10/14/2020 07:57 12/09/2020 07:58 12/29/2020 11:13 02/14/2021 11:35  TSH Latest Ref Range: 0.35 - 5.50 uIU/mL 27.46 (H) 11.25 (H)  5.62 (H)    ASSESSMENT / PLAN / RECOMMENDATIONS:   Hypothyroidism:    - Pt is clinically euthyroid  - She was advised to seperate collagen supplement from LT-4 replacement by 4 hours.  - NO changes at this time, but will check in 2 months and proceed from there     Medications   Continue Synthroid 125 mcg, TWO tablets on Sundays and 1 tablet the rest of the week      F/U in 4 months  Labs in 8 weeks   Addendum: Discussed labs with pt on 02/14/2021 at [place] Signed electronically by: Mack Guise, MD  [facility]  [facility] [address] [place], [place] [address] Phone: [phone] FAX: [phone]      CC: Burnard Hawthorne, FNP [address] [place] [address] Phone: [phone]  Fax: [phone]   Return to Endocrinology clinic as below: Future Appointments  Date Time Provider [place]  04/04/2021  3:30 PM Arnett, Yvetta Coder, FNP [id number]

## 2021-02-18 ENCOUNTER — Other Ambulatory Visit: Payer: Self-pay | Admitting: Internal Medicine

## 2021-03-14 ENCOUNTER — Other Ambulatory Visit: Payer: Self-pay | Admitting: Family

## 2021-03-14 DIAGNOSIS — Z8619 Personal history of other infectious and parasitic diseases: Secondary | ICD-10-CM

## 2021-04-04 ENCOUNTER — Encounter: Payer: Self-pay | Admitting: Family

## 2021-04-04 ENCOUNTER — Other Ambulatory Visit: Payer: Self-pay

## 2021-04-04 ENCOUNTER — Ambulatory Visit (INDEPENDENT_AMBULATORY_CARE_PROVIDER_SITE_OTHER): Payer: 59 | Admitting: Family

## 2021-04-04 VITALS — BP 94/61 | HR 71 | Temp 98.3°F | Ht 62.0 in | Wt 114.6 lb

## 2021-04-04 DIAGNOSIS — F900 Attention-deficit hyperactivity disorder, predominantly inattentive type: Secondary | ICD-10-CM

## 2021-04-04 DIAGNOSIS — F32A Depression, unspecified: Secondary | ICD-10-CM | POA: Diagnosis not present

## 2021-04-04 DIAGNOSIS — G43909 Migraine, unspecified, not intractable, without status migrainosus: Secondary | ICD-10-CM | POA: Diagnosis not present

## 2021-04-04 DIAGNOSIS — F419 Anxiety disorder, unspecified: Secondary | ICD-10-CM

## 2021-04-04 MED ORDER — LISDEXAMFETAMINE DIMESYLATE 10 MG PO CAPS: 10.0000 mg | ORAL_CAPSULE | Freq: Every day | ORAL | 0 refills | Status: DC

## 2021-04-04 MED ORDER — CITALOPRAM HYDROBROMIDE 40 MG PO TABS: 40.0000 mg | ORAL_TABLET | Freq: Every day | ORAL | 2 refills | Status: DC

## 2021-04-04 NOTE — Assessment & Plan Note (Addendum)
Chronic, stable.  Headache similar to headaches in the past .  She will continue the use of Imitrex 25mg   for rescue

## 2021-04-04 NOTE — Assessment & Plan Note (Addendum)
Uncontrolled. Patient was formally diagnosed with ADHD however we do not have records of this. Patient is very agreeable to being retested and formally evaluated for this.  Referral has been placed.  New controlled substance contract has been placed.  We will start Vyvanse 10mg  with close diligence to side effects including increased anxiety as she had on adderall ( adderall 40mg  at the time). . I looked up patient on [place] Controlled Substances Reporting System PMP AWARE and saw no activity that raised concern of inappropriate use.

## 2021-04-04 NOTE — Progress Notes (Signed)
Subjective:    Patient ID: Rachel Alexander, female    DOB: [birth year], 40 y.o.   MRN: [id number]  CC: Rachel Alexander is a 40 y.o. female who presents today for follow up.   HPI: Decreased movtiation x 6  months which has become more bothersome to her. She questions whether she she go back to adderall , however at a smaller dose as it had previously made her feel more anxious.    She is not going out with friends and feels more withdrawn.   Life is 'peaceful' and she is happy at work.   She complains that she struggles to complete a task at work.   Able to fall asleep easily. Sleep is restorative.   No si/hi  Tried adderall 5mg  BID prn, adderall 30mg  XR,  wellbutrin 150mg , effexor , trazodone in the past.  No history of heart disease, arrhythmia  Anxiety and depression-compliant with celexa 40mg , Xanax.  Celexa has been most helpful in regards to the antidepressants that she is tried.  Using xanax  1mg  4x per week with relief.  Overall feels regimen is working well. she did not hear from psychiatry referral was placed couple months ago.  She was still like to pursue this.  Continues to follow with endocrinology, last seen August 10/15/2020 and patient was euthyroid.  No changes to Synthroid.  HA are very infrequent. HA was similar to pass. Imitrex with relief. No vision loss.    HISTORY:  Past Medical History:  Diagnosis Date   Colitis    Depression    Frequent headaches    Hypothyroidism    Migraines    Pituitary tumor    Suicidal intent    Past Surgical History:  Procedure Laterality Date   BREAST CYST ASPIRATION Right 2012   COLONOSCOPY N/A 08/24/2019   Procedure: COLONOSCOPY;  Surgeon: Jonathon Bellows, MD;  Location: [facility] ENDOSCOPY;  Service: Gastroenterology;  Laterality: N/A;   TONSILLECTOMY     TUBAL LIGATION     Family History  Problem Relation Age of Onset   Healthy Mother    Liver cancer Father    Diabetes Sister    Breast cancer Maternal Aunt     Allergies:  Patient has no known allergies. Current Outpatient Medications on File Prior to Visit  Medication Sig Dispense Refill   ALPRAZolam (XANAX) 1 MG tablet TAKE 1 TABLET BY MOUTH 2 TIMES DAILY AS NEEDED FOR ANXIETY. (INS PREMIUM NOT PAID) 60 tablet 2   CRANBERRY PO Take by mouth.     SUMAtriptan (IMITREX) 25 MG tablet TAKE 1 TAB BY MOUTH DAILY. MAY REPEAT ONCE 2 HRS LATER IF HEADACHE PERSISTS OR RECURS. MAX 2 DOSES. 9 tablet 2   SYNTHROID 125 MCG tablet TAKE 1 TABLET BY MOUTH EVERY DAY 104 tablet 0   valACYclovir (VALTREX) 500 MG tablet TAKE 500 MG BY MOUTH X EVERY 12 HOURS X 3 DAYS. ASAP W/IN 24H OF SYMPTOMS ONSET. 18 tablet 2   cholecalciferol (VITAMIN D3) 25 MCG (1000 UNIT) tablet Take 1,000 Units by mouth daily. (Patient not taking: Reported on 04/04/2021)     omeprazole (PRILOSEC) 20 MG capsule Take 1 capsule (20 mg total) by mouth daily. (Patient not taking: Reported on 04/04/2021) 14 capsule 0   [DISCONTINUED] dicyclomine (BENTYL) 20 MG tablet Take 1 tablet (20 mg total) by mouth 2 (two) times daily. 20 tablet 0   No current facility-administered medications on file prior to visit.    Social History   Tobacco Use  Smoking status: Former    Packs/day: 1.00    Types: Cigarettes    Quit date: 10/06/2017    Years since quitting: 3.4   Smokeless tobacco: Never  Vaping Use   Vaping Use: Every day  Substance Use Topics   Alcohol use: Yes    Comment: occ   Drug use: No    Review of Systems  Constitutional:  Negative for chills and fever.  Respiratory:  Negative for cough.   Cardiovascular:  Negative for chest pain and palpitations.  Gastrointestinal:  Negative for nausea and vomiting.  Psychiatric/Behavioral:  Positive for decreased concentration. Negative for sleep disturbance and suicidal ideas. The patient is nervous/anxious (controlled).      Objective:    BP 94/61 (BP Location: Left Arm, Patient Position: Sitting, Cuff Size: Normal)   Pulse 71   Temp 98.3 F (36.8 C) (Oral)    Ht 5\' 2"  (1.575 m)   Wt 114 lb 9.6 oz (52 kg)   SpO2 99%   BMI 20.96 kg/m  BP Readings from Last 3 Encounters:  04/04/21 94/61  02/14/21 102/64  12/31/20 (!) 88/56   Wt Readings from Last 3 Encounters:  04/04/21 114 lb 9.6 oz (52 kg)  02/14/21 111 lb 12.8 oz (50.7 kg)  12/31/20 114 lb (51.7 kg)    Physical Exam Vitals reviewed.  Constitutional:      Appearance: She is well-developed.  Eyes:     Conjunctiva/sclera: Conjunctivae normal.  Cardiovascular:     Rate and Rhythm: Normal rate and regular rhythm.     Pulses: Normal pulses.     Heart sounds: Normal heart sounds.  Pulmonary:     Effort: Pulmonary effort is normal.     Breath sounds: Normal breath sounds. No wheezing, rhonchi or rales.  Skin:    General: Skin is warm and dry.  Neurological:     Mental Status: She is alert.  Psychiatric:        Speech: Speech normal.        Behavior: Behavior normal.        Thought Content: Thought content normal.       Assessment & Plan:   Problem List Items Addressed This Visit       Cardiovascular and Mediastinum   Migraines    Chronic, stable.  Headache similar to headaches in the past .  She will continue the use of Imitrex 25mg   for rescue      Relevant Medications   citalopram (CELEXA) 40 MG tablet     Other   Anxiety and depression - Primary    Uncontrolled.  Discussed with her whether decreased concentration, motivation, withdrawing from friends are symptoms of depression or ADHD, or both.  Patient is most interested in starting stimulant medication versus making any changes to her antidepressants at this time.  She feels Celexa has been most helpful and she is hesitant to change this at this time.  Referral has been replaced to psychiatry for further evaluation.  Continue Celexa 40 mg, Xanax 1 mg as needed for now.  Patient will let me know how she is doing.  Close follow-up      Relevant Medications   citalopram (CELEXA) 40 MG tablet   Other Relevant Orders    Ambulatory referral to Psychiatry   Ambulatory referral to Psychiatry   Attention deficit hyperactivity disorder, predominantly inattentive type    Uncontrolled. Patient was formally diagnosed with ADHD however we do not have records of this. Patient is very agreeable to  being retested and formally evaluated for this.  Referral has been placed.  New controlled substance contract has been placed.  We will start Vyvanse 10mg  with close diligence to side effects including increased anxiety as she had on adderall ( adderall 40mg  at the time). . I looked up patient on [place] Controlled Substances Reporting System PMP AWARE and saw no activity that raised concern of inappropriate use.        Relevant Medications   lisdexamfetamine (VYVANSE) 10 MG capsule     I have discontinued Kimmy Milby "[name]"'s ibuprofen and metroNIDAZOLE. I have also changed her citalopram. Additionally, I am having her start on lisdexamfetamine. Lastly, I am having her maintain her cholecalciferol, CRANBERRY PO, omeprazole, SUMAtriptan, ALPRAZolam, Synthroid, and valACYclovir.   Meds ordered this encounter  Medications   lisdexamfetamine (VYVANSE) 10 MG capsule    Sig: Take 1 capsule (10 mg total) by mouth daily.    Dispense:  30 capsule    Refill:  0    Order Specific Question:   Supervising Provider    Answer:   Deborra Medina L [[id number]]   citalopram (CELEXA) 40 MG tablet    Sig: Take 1 tablet (40 mg total) by mouth daily.    Dispense:  90 tablet    Refill:  2    Order Specific Question:   Supervising Provider    Answer:   Crecencio Mc [[id number]]    Return precautions given.   Risks, benefits, and alternatives of the medications and treatment plan prescribed today were discussed, and patient expressed understanding.   Education regarding symptom management and diagnosis given to patient on AVS.  Continue to follow with Burnard Hawthorne, FNP for routine health maintenance.   Rachel Alexander and I agreed with  plan.   Mable Paris, FNP

## 2021-04-04 NOTE — Patient Instructions (Addendum)
Start vyvanse Pay close attention to increased anxiety, heart palpitations, trouble sleeping, worsening depression  Referral to formal ADHD testing, as well as psychiatry  Let us know if you dont hear back within a week in regards to an appointment being scheduled.

## 2021-04-05 NOTE — Assessment & Plan Note (Signed)
Uncontrolled.  Discussed with her whether decreased concentration, motivation, withdrawing from friends are symptoms of depression or ADHD, or both.  Patient is most interested in starting stimulant medication versus making any changes to her antidepressants at this time.  She feels Celexa has been most helpful and she is hesitant to change this at this time.  Referral has been replaced to psychiatry for further evaluation.  Continue Celexa 40 mg, Xanax 1 mg as needed for now.  Patient will let me know how she is doing.  Close follow-up

## 2021-04-07 NOTE — Progress Notes (Signed)
FYI we had not changed in office, but I called patient & changed chart to correct address.

## 2021-04-11 ENCOUNTER — Other Ambulatory Visit: Payer: Self-pay | Admitting: Family

## 2021-04-11 DIAGNOSIS — R519 Headache, unspecified: Secondary | ICD-10-CM

## 2021-04-11 DIAGNOSIS — F32A Depression, unspecified: Secondary | ICD-10-CM

## 2021-04-11 DIAGNOSIS — F419 Anxiety disorder, unspecified: Secondary | ICD-10-CM

## 2021-04-11 NOTE — Telephone Encounter (Signed)
RX Refill: xanax Last Seen: 04-04-21 Last Ordered: 01-19-21 Next Appt: 05-09-21

## 2021-04-12 ENCOUNTER — Other Ambulatory Visit: Payer: 59

## 2021-05-09 ENCOUNTER — Encounter: Payer: Self-pay | Admitting: Family

## 2021-05-09 ENCOUNTER — Telehealth (INDEPENDENT_AMBULATORY_CARE_PROVIDER_SITE_OTHER): Payer: 59 | Admitting: Family

## 2021-05-09 VITALS — Ht 62.0 in | Wt 113.0 lb

## 2021-05-09 DIAGNOSIS — F32A Depression, unspecified: Secondary | ICD-10-CM | POA: Diagnosis not present

## 2021-05-09 DIAGNOSIS — F900 Attention-deficit hyperactivity disorder, predominantly inattentive type: Secondary | ICD-10-CM | POA: Diagnosis not present

## 2021-05-09 DIAGNOSIS — F419 Anxiety disorder, unspecified: Secondary | ICD-10-CM | POA: Diagnosis not present

## 2021-05-09 MED ORDER — AMPHETAMINE-DEXTROAMPHETAMINE 5 MG PO TABS: 5.0000 mg | ORAL_TABLET | Freq: Two times a day (BID) | ORAL | 0 refills | Status: DC

## 2021-05-09 NOTE — Assessment & Plan Note (Addendum)
Uncontrolled.  Stop Vyvanse.  Start Adderall 5 mg twice daily.  Awaiting to schedule psychiatry consult for formal ADHD testing. I looked up patient on [place] Controlled Substances Reporting System PMP AWARE and saw no activity that raised concern of inappropriate use.

## 2021-05-09 NOTE — Assessment & Plan Note (Signed)
Chronic, stable.  Continue Celexa 40 mg, Xanax 1 mg once to twice daily.  Discussed and we agreed ADHD is better controlled, we will wean off of Xanax to more of a as needed, infrequent use.  We will continue discuss at follow-up.

## 2021-05-09 NOTE — Patient Instructions (Signed)
Trial of Adderall 5 mg twice daily.  Stop vyvanse. Please monitor for increased anxiety, trouble sleeping or palpitations.  Happy holidays!

## 2021-05-09 NOTE — Progress Notes (Signed)
Virtual Visit via Video Note  I connected with@  on 05/09/21 at 12:00 PM EST by a video enabled telemedicine application and verified that I am speaking with the correct person using two identifiers.  Location patient: home Location provider:work  Persons participating in the virtual visit: patient, provider  I discussed the limitations of evaluation and management by telemedicine and the availability of in person appointments. The patient expressed understanding and agreed to proceed.   HPI:  Follow-up anxiety and depression  She feels well today.  No new complaints  She is compliant with Celexa 40 mg, Xanax 1 mg as needed for now.She feels anxiety is well controlled. She is using xanax 1mg  mostly daily.  On occasion twice daily.  ADHD- started vyvanse 10mg  at last visit however she doesn't feel helping with concentration.  In the past she been on Adderall 5 mg, and found this dose to be very helpful for concentration and did not increase her anxiety.  She feels that not treating her ADHD, anxiety is increased.  she would like to retrial  adderall.   ROS: See pertinent positives and negatives per HPI.    EXAM:  VITALS per patient if applicable: Ht 5\' 2"  (1.575 m)   Wt 113 lb (51.3 kg)   BMI 20.67 kg/m  BP Readings from Last 3 Encounters:  04/04/21 94/61  02/14/21 102/64  12/31/20 (!) 88/56   Wt Readings from Last 3 Encounters:  05/09/21 113 lb (51.3 kg)  04/04/21 114 lb 9.6 oz (52 kg)  02/14/21 111 lb 12.8 oz (50.7 kg)    GENERAL: alert, oriented, appears well and in no acute distress  HEENT: atraumatic, conjunttiva clear, no obvious abnormalities on inspection of external nose and ears  NECK: normal movements of the head and neck  LUNGS: on inspection no signs of respiratory distress, breathing rate appears normal, no obvious gross SOB, gasping or wheezing  CV: no obvious cyanosis  MS: moves all visible extremities without noticeable  abnormality  PSYCH/NEURO: pleasant and cooperative, no obvious depression or anxiety, speech and thought processing grossly intact  ASSESSMENT AND PLAN:  Discussed the following assessment and plan:  Problem List Items Addressed This Visit       Other   Anxiety and depression    Chronic, stable.  Continue Celexa 40 mg, Xanax 1 mg once to twice daily.  Discussed and we agreed ADHD is better controlled, we will wean off of Xanax to more of a as needed, infrequent use.  We will continue discuss at follow-up.      Attention deficit hyperactivity disorder, predominantly inattentive type - Primary    Uncontrolled.  Stop Vyvanse.  Start Adderall 5 mg twice daily.  Awaiting to schedule psychiatry consult for formal ADHD testing. I looked up patient on [place] Controlled Substances Reporting System PMP AWARE and saw no activity that raised concern of inappropriate use.        Relevant Medications   amphetamine-dextroamphetamine (ADDERALL) 5 MG tablet    -we discussed possible serious and likely etiologies, options for evaluation and workup, limitations of telemedicine visit vs in person visit, treatment, treatment risks and precautions. Pt prefers to treat via telemedicine empirically rather then risking or undertaking an in person visit at this moment.  .   I discussed the assessment and treatment plan with the patient. The patient was provided an opportunity to ask questions and all were answered. The patient agreed with the plan and demonstrated an understanding of the instructions.   The  patient was advised to call back or seek an in-person evaluation if the symptoms worsen or if the condition fails to improve as anticipated.   Mable Paris, FNP

## 2021-05-30 ENCOUNTER — Other Ambulatory Visit: Payer: Self-pay | Admitting: Family

## 2021-05-30 DIAGNOSIS — Z8619 Personal history of other infectious and parasitic diseases: Secondary | ICD-10-CM

## 2021-06-08 ENCOUNTER — Other Ambulatory Visit: Payer: 59

## 2021-06-15 ENCOUNTER — Other Ambulatory Visit: Payer: Self-pay | Admitting: Family

## 2021-06-15 DIAGNOSIS — F900 Attention-deficit hyperactivity disorder, predominantly inattentive type: Secondary | ICD-10-CM

## 2021-06-15 NOTE — Telephone Encounter (Signed)
Last OV 05/09/21 and has future Appt scheduled 06/24/21

## 2021-06-17 ENCOUNTER — Other Ambulatory Visit: Payer: Self-pay | Admitting: Family

## 2021-06-17 DIAGNOSIS — F900 Attention-deficit hyperactivity disorder, predominantly inattentive type: Secondary | ICD-10-CM

## 2021-06-17 MED ORDER — AMPHETAMINE-DEXTROAMPHETAMINE 5 MG PO TABS: 5.0000 mg | ORAL_TABLET | Freq: Two times a day (BID) | ORAL | 0 refills | Status: DC

## 2021-06-17 NOTE — Progress Notes (Signed)
I looked up patient on [place] Controlled Substances Reporting System PMP AWARE and saw no activity that raised concern of inappropriate use.   

## 2021-06-24 ENCOUNTER — Other Ambulatory Visit: Payer: Self-pay | Admitting: Internal Medicine

## 2021-06-24 ENCOUNTER — Other Ambulatory Visit: Payer: Self-pay

## 2021-06-24 ENCOUNTER — Other Ambulatory Visit (INDEPENDENT_AMBULATORY_CARE_PROVIDER_SITE_OTHER): Payer: Self-pay

## 2021-06-24 ENCOUNTER — Telehealth: Payer: Self-pay | Admitting: Family

## 2021-06-24 DIAGNOSIS — E039 Hypothyroidism, unspecified: Secondary | ICD-10-CM

## 2021-06-24 LAB — TSH: TSH: 9.22 u[IU]/mL — ABNORMAL HIGH (ref 0.35–5.50)

## 2021-06-24 MED ORDER — LEVOTHYROXINE SODIUM 125 MCG PO TABS: 125.0000 ug | ORAL_TABLET | Freq: Every day | ORAL | 1 refills | Status: DC

## 2021-06-24 NOTE — Telephone Encounter (Signed)
Lft tp a vm to call the ofc regarding referral. thanks

## 2021-07-03 ENCOUNTER — Other Ambulatory Visit: Payer: Self-pay | Admitting: Family

## 2021-07-03 DIAGNOSIS — F32A Depression, unspecified: Secondary | ICD-10-CM

## 2021-07-03 DIAGNOSIS — F419 Anxiety disorder, unspecified: Secondary | ICD-10-CM

## 2021-07-05 NOTE — Telephone Encounter (Signed)
Refilled: 04/12/2021 Last OV: 05/09/2021 Next OV: not scheduled

## 2021-07-05 NOTE — Telephone Encounter (Signed)
Pt called in requesting refill on medication (ALPRAZolam (XANAX) 1 MG tablet). Pt stated that she called pharmacy already. Pt requesting callback with update.

## 2021-07-05 NOTE — Telephone Encounter (Signed)
Schedule follow up with Burnard Hawthorne, FNP  Refill x 1

## 2021-07-06 NOTE — Telephone Encounter (Signed)
Appt scheduled for 07/27/2021.

## 2021-07-20 ENCOUNTER — Other Ambulatory Visit: Payer: Self-pay | Admitting: Family

## 2021-07-20 DIAGNOSIS — F900 Attention-deficit hyperactivity disorder, predominantly inattentive type: Secondary | ICD-10-CM

## 2021-07-20 NOTE — Telephone Encounter (Signed)
Okay to refill?  LOV: 05/09/21 NOV: 07/27/21

## 2021-07-22 ENCOUNTER — Other Ambulatory Visit: Payer: Self-pay | Admitting: Family

## 2021-07-22 DIAGNOSIS — F900 Attention-deficit hyperactivity disorder, predominantly inattentive type: Secondary | ICD-10-CM

## 2021-07-27 ENCOUNTER — Encounter: Payer: Self-pay | Admitting: Family

## 2021-07-27 ENCOUNTER — Other Ambulatory Visit: Payer: Self-pay

## 2021-07-27 ENCOUNTER — Ambulatory Visit (INDEPENDENT_AMBULATORY_CARE_PROVIDER_SITE_OTHER): Payer: 59 | Admitting: Family

## 2021-07-27 VITALS — BP 98/62 | HR 83 | Temp 98.8°F | Ht 62.0 in | Wt 112.6 lb

## 2021-07-27 DIAGNOSIS — F900 Attention-deficit hyperactivity disorder, predominantly inattentive type: Secondary | ICD-10-CM

## 2021-07-27 DIAGNOSIS — F32A Depression, unspecified: Secondary | ICD-10-CM

## 2021-07-27 DIAGNOSIS — R61 Generalized hyperhidrosis: Secondary | ICD-10-CM | POA: Insufficient documentation

## 2021-07-27 DIAGNOSIS — N6452 Nipple discharge: Secondary | ICD-10-CM | POA: Diagnosis not present

## 2021-07-27 DIAGNOSIS — F419 Anxiety disorder, unspecified: Secondary | ICD-10-CM

## 2021-07-27 NOTE — Patient Instructions (Signed)
I am ordering images of your chest and pelvis.  I also placed an urgent referral to psychiatry and general surgery to evaluate the right nipple discharge. Let us know if you dont hear back within a week in regards to an appointment being scheduled.

## 2021-07-27 NOTE — Assessment & Plan Note (Signed)
Idiopathic at this time.  Discussed differentials including malignancy work-up, hypothyroidism.  Pending labs, CT chest without contrast, CT abdomen pelvis without contrast.  Close follow-up

## 2021-07-27 NOTE — Assessment & Plan Note (Addendum)
Symptom continues and she can express foul smelling discharge from right breast.  Palpable mass approximately 9:00.  Urgent referral to general surgery. Patient prefers general surgery consult versus diagnostic imaging at this time. Will follow closely.

## 2021-07-27 NOTE — Progress Notes (Signed)
Subjective:    Patient ID: Rachel Alexander, female    DOB: [birth year], 41 y.o.   MRN: [id number]  CC: Rachel Alexander is a 41 y.o. female who presents today for follow up.   HPI: Complains of  hot flashes  x 6 weeks.  She wakes up soaking wet when she wakes up in the morning. Sheets are drenched.  This is occurring nightly.  Occasionally occurs during the day .  She does not think related to temperature changes.  No fever, chills, cough , dysuria, rashes.  She has lost 2 lbs. She is not eating as much due to recent stress and moving.   Menses are monthly. She started her cycle 5 days ago.  She is not sexually active  She continues to complain of right breast discharge for years.  She can express odorous discharge from the nipple.  It is not bloody nor spontaneous.  She also can still feel the right breast mass which was evaluated 11/14/19.  The area remains tender however she does not think that it is enlarged.    Anxiety and depression-compliant with Celexa 40 mg, Xanax 1 mg once to twice daily. Xanax is helpful. She describes increased anxiety and trouble sleeping. More anxiety than depression. She is happy at work and children are doing well.  She was evicted from home and living with husband, although not together.  No si/hi  ADHD-she is no longer on Vyvanse.  Compliant with Adderall 5 mg twice daily which hasnt caused palpitations.  She finds medication to be helpful.  Hypothyroidism- She is compliant with synthroid 131mcg daily and two tabs on Sunday and 4 weeks ago increased to two tabs on Saturday.  Endorses constipation. Last BM yesterday. No abdominal pain. Taking stool softer ( 3) daily.   Recent TSH 9 with prior 5.62     HISTORY:  Past Medical History:  Diagnosis Date   Colitis    Depression    Frequent headaches    Hypothyroidism    Migraines    Pituitary tumor    Suicidal intent    Past Surgical History:  Procedure Laterality Date   BREAST CYST ASPIRATION Right  2012   COLONOSCOPY N/A 08/24/2019   Procedure: COLONOSCOPY;  Surgeon: Jonathon Bellows, MD;  Location: [facility] ENDOSCOPY;  Service: Gastroenterology;  Laterality: N/A;   TONSILLECTOMY     TUBAL LIGATION     Family History  Problem Relation Age of Onset   Healthy Mother    Liver cancer Father    Diabetes Sister    Breast cancer Maternal Aunt     Allergies: Patient has no known allergies. Current Outpatient Medications on File Prior to Visit  Medication Sig Dispense Refill   ALPRAZolam (XANAX) 1 MG tablet Take 1 tablet (1 mg total) by mouth 2 (two) times daily as needed for anxiety. 60 tablet 2   amphetamine-dextroamphetamine (ADDERALL) 5 MG tablet Take 1 tablet (5 mg total) by mouth 2 (two) times daily. 60 tablet 0   amphetamine-dextroamphetamine (ADDERALL) 5 MG tablet Take 1 tablet (5 mg total) by mouth 2 (two) times daily. 60 tablet 0   amphetamine-dextroamphetamine (ADDERALL) 5 MG tablet Take 1 tablet (5 mg total) by mouth 2 (two) times daily. 60 tablet 0   cholecalciferol (VITAMIN D3) 25 MCG (1000 UNIT) tablet Take 1,000 Units by mouth daily.     citalopram (CELEXA) 40 MG tablet Take 1 tablet (40 mg total) by mouth daily. 90 tablet 2   CRANBERRY PO Take by mouth.  levothyroxine (SYNTHROID) 125 MCG tablet Take 1 tablet (125 mcg total) by mouth daily. (Patient taking differently: Take 125 mcg by mouth daily. Take 125 mcg daily except Saturday & Sunday take 2 tablets (250 mcg).) 117 tablet 1   SUMAtriptan (IMITREX) 25 MG tablet TAKE 1 TAB BY MOUTH DAILY. MAY REPEAT ONCE 2 HRS LATER IF HEADACHE PERSISTS OR RECURS. MAX 2 DOSES. 9 tablet 2   valACYclovir (VALTREX) 500 MG tablet TAKE 500 MG BY MOUTH X EVERY 12 HOURS X 3 DAYS. ASAP W/IN 24H OF SYMPTOMS ONSET. 18 tablet 2   [DISCONTINUED] dicyclomine (BENTYL) 20 MG tablet Take 1 tablet (20 mg total) by mouth 2 (two) times daily. 20 tablet 0   No current facility-administered medications on file prior to visit.    Social History   Tobacco Use    Smoking status: Former    Packs/day: 1.00    Types: Cigarettes    Quit date: 10/06/2017    Years since quitting: 3.8   Smokeless tobacco: Never  Vaping Use   Vaping Use: Every day  Substance Use Topics   Alcohol use: Yes    Comment: occ   Drug use: No    Review of Systems  Constitutional:  Positive for appetite change, diaphoresis and unexpected weight change. Negative for chills and fever.  HENT:  Negative for congestion.   Respiratory:  Negative for cough, shortness of breath and wheezing.   Cardiovascular:  Negative for chest pain and palpitations.  Gastrointestinal:  Positive for constipation. Negative for abdominal pain, nausea and vomiting.  Genitourinary:  Negative for dysuria.  Hematological:  Negative for adenopathy.     Objective:    BP 98/62 (BP Location: Left Arm, Patient Position: Sitting, Cuff Size: Normal)    Pulse 83    Temp 98.8 F (37.1 C) (Oral)    Ht 5\' 2"  (1.575 m)    Wt 112 lb 9.6 oz (51.1 kg)    SpO2 98%    BMI 20.59 kg/m  BP Readings from Last 3 Encounters:  07/27/21 98/62  04/04/21 94/61  02/14/21 102/64   Wt Readings from Last 3 Encounters:  07/27/21 112 lb 9.6 oz (51.1 kg)  05/09/21 113 lb (51.3 kg)  04/04/21 114 lb 9.6 oz (52 kg)    Physical Exam Vitals reviewed.  Constitutional:      Appearance: Normal appearance. She is well-developed.  HENT:     Head: Normocephalic and atraumatic.     Mouth/Throat:     Pharynx: Uvula midline.     Tonsils: No tonsillar abscesses.  Eyes:     Conjunctiva/sclera: Conjunctivae normal.  Neck:     Thyroid: No thyroid mass, thyromegaly or thyroid tenderness.  Cardiovascular:     Rate and Rhythm: Normal rate and regular rhythm.     Pulses: Normal pulses.     Heart sounds: Normal heart sounds.  Pulmonary:     Effort: Pulmonary effort is normal.     Breath sounds: Normal breath sounds. No wheezing, rhonchi or rales.  Chest:  Breasts:    Right: Mass and tenderness present. No inverted nipple or nipple  discharge.     Left: No inverted nipple, mass, nipple discharge, skin change or tenderness.       Comments: Right breast Palpable tender mass appreciated approximately 9:00.  No difficult nipple discharge on exam. Abdominal:     General: Bowel sounds are normal. There is no distension.     Palpations: Abdomen is soft. Abdomen is not rigid. There is no  fluid wave or mass.     Tenderness: There is no abdominal tenderness. There is no guarding or rebound.  Lymphadenopathy:     Head:     Right side of head: No submental, submandibular, tonsillar, preauricular, posterior auricular or occipital adenopathy.     Left side of head: No submental, submandibular, tonsillar, preauricular, posterior auricular or occipital adenopathy.     Cervical: No cervical adenopathy.  Skin:    General: Skin is warm and dry.  Neurological:     Mental Status: She is alert.  Psychiatric:        Speech: Speech normal.        Behavior: Behavior normal.        Thought Content: Thought content normal.       Assessment & Plan:   Problem List Items Addressed This Visit       Other   Anxiety and depression    Suboptimal control.  Discussed with patient we could trial another SSRI ( such as Prozac, Paxil) and she politely declines at this time as she would prefer to see psychiatry before any further changes.  I certainly understand this.  We will continue Celexa 40 mg and Xanax 1 mg twice daily as needed.  Urgent referral placed for psychiatry      Relevant Orders   Ambulatory referral to Psychiatry   Attention deficit hyperactivity disorder, predominantly inattentive type    Chronic, stable.  Continue Adderall 5 mg twice daily.      Discharge from right nipple    Symptom continues and she can express foul smelling discharge from right breast.  Palpable mass approximately 9:00.  Urgent referral to general surgery. Patient prefers general surgery consult versus diagnostic imaging at this time. Will follow  closely.       Relevant Orders   Ambulatory referral to General Surgery   Night sweats - Primary    Idiopathic at this time.  Discussed differentials including malignancy work-up, hypothyroidism.  Pending labs, CT chest without contrast, CT abdomen pelvis without contrast.  Close follow-up      Relevant Orders   TSH   FSH   Prolactin   VITAMIN D 25 Hydroxy (Vit-D Deficiency, Fractures)   Hemoglobin A1c   B12 and Folate Panel   CBC with Differential/Platelet   Comprehensive metabolic panel   Urinalysis, Routine w reflex microscopic   CT Chest Wo Contrast   CT Abdomen Pelvis Wo Contrast     I am having Rachel Jacquot "[name]" maintain her cholecalciferol, CRANBERRY PO, citalopram, SUMAtriptan, valACYclovir, amphetamine-dextroamphetamine, amphetamine-dextroamphetamine, amphetamine-dextroamphetamine, levothyroxine, and ALPRAZolam.   No orders of the defined types were placed in this encounter.   Return precautions given.   Risks, benefits, and alternatives of the medications and treatment plan prescribed today were discussed, and patient expressed understanding.   Education regarding symptom management and diagnosis given to patient on AVS.  Continue to follow with Burnard Hawthorne, FNP for routine health maintenance.   Rachel Alexander and I agreed with plan.   Mable Paris, FNP

## 2021-07-27 NOTE — Assessment & Plan Note (Addendum)
Suboptimal control.  Discussed with patient we could trial another SSRI ( such as Prozac, Paxil) and she politely declines at this time as she would prefer to see psychiatry before any further changes.  I certainly understand this.  We will continue Celexa 40 mg and Xanax 1 mg twice daily as needed.  Urgent referral placed for psychiatry

## 2021-07-27 NOTE — Assessment & Plan Note (Signed)
Chronic, stable.  Continue Adderall 5 mg twice daily.

## 2021-07-28 ENCOUNTER — Encounter: Payer: Self-pay | Admitting: Family

## 2021-07-28 LAB — URINALYSIS, ROUTINE W REFLEX MICROSCOPIC
Bilirubin Urine: NEGATIVE
Ketones, ur: NEGATIVE
Leukocytes,Ua: NEGATIVE
Nitrite: NEGATIVE
Specific Gravity, Urine: 1.01 (ref 1.000–1.030)
Total Protein, Urine: NEGATIVE
Urine Glucose: NEGATIVE
Urobilinogen, UA: 0.2 (ref 0.0–1.0)
pH: 6 (ref 5.0–8.0)

## 2021-07-28 LAB — B12 AND FOLATE PANEL
Folate: 11.2 ng/mL (ref >5.9)
Vitamin B-12: 270 pg/mL (ref 211–911)

## 2021-07-28 LAB — COMPREHENSIVE METABOLIC PANEL
ALT: 12 U/L (ref 0–35)
AST: 17 U/L (ref 0–37)
Albumin: 4.2 g/dL (ref 3.5–5.2)
Alkaline Phosphatase: 28 U/L — ABNORMAL LOW (ref 39–117)
BUN: 18 mg/dL (ref 6–23)
CO2: 33 mEq/L — ABNORMAL HIGH (ref 19–32)
Calcium: 8.9 mg/dL (ref 8.4–10.5)
Chloride: 103 mEq/L (ref 96–112)
Creatinine, Ser: 0.73 mg/dL (ref 0.40–1.20)
GFR: 101.79 mL/min (ref >60.00)
Glucose, Bld: 75 mg/dL (ref 70–99)
Potassium: 3.8 mEq/L (ref 3.5–5.1)
Sodium: 141 mEq/L (ref 135–145)
Total Bilirubin: 0.4 mg/dL (ref 0.2–1.2)
Total Protein: 6.4 g/dL (ref 6.0–8.3)

## 2021-07-28 LAB — PROLACTIN: Prolactin: 5.1 ng/mL

## 2021-07-28 LAB — TSH: TSH: 7.07 u[IU]/mL — ABNORMAL HIGH (ref 0.35–5.50)

## 2021-07-28 LAB — CBC WITH DIFFERENTIAL/PLATELET
Basophils Absolute: 0 10*3/uL (ref 0.0–0.1)
Basophils Relative: 0.6 % (ref 0.0–3.0)
Eosinophils Absolute: 0 10*3/uL (ref 0.0–0.7)
Eosinophils Relative: 0.3 % (ref 0.0–5.0)
HCT: 35.1 % — ABNORMAL LOW (ref 36.0–46.0)
Hemoglobin: 11.7 g/dL — ABNORMAL LOW (ref 12.0–15.0)
Lymphocytes Relative: 48.1 % — ABNORMAL HIGH (ref 12.0–46.0)
Lymphs Abs: 2.1 10*3/uL (ref 0.7–4.0)
MCHC: 33.3 g/dL (ref 30.0–36.0)
MCV: 102.6 fl — ABNORMAL HIGH (ref 78.0–100.0)
Monocytes Absolute: 0.3 10*3/uL (ref 0.1–1.0)
Monocytes Relative: 7.7 % (ref 3.0–12.0)
Neutro Abs: 1.9 10*3/uL (ref 1.4–7.7)
Neutrophils Relative %: 43.3 % (ref 43.0–77.0)
Platelets: 173 10*3/uL (ref 150.0–400.0)
RBC: 3.42 Mil/uL — ABNORMAL LOW (ref 3.87–5.11)
RDW: 12.4 % (ref 11.5–15.5)
WBC: 4.3 10*3/uL (ref 4.0–10.5)

## 2021-07-28 LAB — FOLLICLE STIMULATING HORMONE: FSH: 19.3 m[IU]/mL

## 2021-07-28 LAB — HEMOGLOBIN A1C: Hgb A1c MFr Bld: 5.4 % (ref 4.6–6.5)

## 2021-07-28 LAB — VITAMIN D 25 HYDROXY (VIT D DEFICIENCY, FRACTURES): VITD: 25.6 ng/mL — ABNORMAL LOW (ref 30.00–100.00)

## 2021-07-29 ENCOUNTER — Other Ambulatory Visit: Payer: Self-pay | Admitting: Family

## 2021-07-29 ENCOUNTER — Telehealth: Payer: Self-pay | Admitting: Family

## 2021-07-29 DIAGNOSIS — R319 Hematuria, unspecified: Secondary | ICD-10-CM

## 2021-07-29 DIAGNOSIS — E538 Deficiency of other specified B group vitamins: Secondary | ICD-10-CM

## 2021-07-29 NOTE — Telephone Encounter (Signed)
Patient called stating she received a call from Rachel Alexander and stated she would call back when she returned home. Patient stated her insurance information was needed and that she has uploaded her info to [employer].

## 2021-08-01 ENCOUNTER — Other Ambulatory Visit: Payer: Self-pay | Admitting: Family

## 2021-08-01 DIAGNOSIS — R519 Headache, unspecified: Secondary | ICD-10-CM

## 2021-08-02 ENCOUNTER — Ambulatory Visit (INDEPENDENT_AMBULATORY_CARE_PROVIDER_SITE_OTHER): Payer: 59

## 2021-08-02 ENCOUNTER — Other Ambulatory Visit: Payer: Self-pay

## 2021-08-02 DIAGNOSIS — E538 Deficiency of other specified B group vitamins: Secondary | ICD-10-CM | POA: Diagnosis not present

## 2021-08-02 MED ORDER — CYANOCOBALAMIN 1000 MCG/ML IJ SOLN
1000.0000 ug | Freq: Once | INTRAMUSCULAR | Status: AC
  Administered 2021-08-02: 1000 ug via INTRAMUSCULAR

## 2021-08-02 NOTE — Progress Notes (Signed)
Rachel Alexander presents today for injection per MD orders. B12 injection administered IM in right Upper Arm. Administration without incident. Patient tolerated well.  Heli Dino,cma

## 2021-08-05 ENCOUNTER — Telehealth: Payer: Self-pay | Admitting: Family

## 2021-08-05 DIAGNOSIS — N6452 Nipple discharge: Secondary | ICD-10-CM

## 2021-08-05 NOTE — Telephone Encounter (Signed)
Rachel Alexander from Dr [place] Calix office with general surgery calling in about Patient's referral. They are requesting a call back. Their phones cut off at 12 today. They open back up at 8 am on Monday if not reached today   [phone]

## 2021-08-08 ENCOUNTER — Telehealth: Payer: Self-pay | Admitting: Family

## 2021-08-08 NOTE — Telephone Encounter (Signed)
error 

## 2021-08-09 ENCOUNTER — Other Ambulatory Visit: Payer: Self-pay

## 2021-08-09 ENCOUNTER — Telehealth: Payer: Self-pay | Admitting: Family

## 2021-08-09 ENCOUNTER — Ambulatory Visit (INDEPENDENT_AMBULATORY_CARE_PROVIDER_SITE_OTHER): Payer: 59 | Admitting: *Deleted

## 2021-08-09 DIAGNOSIS — E538 Deficiency of other specified B group vitamins: Secondary | ICD-10-CM

## 2021-08-09 DIAGNOSIS — R61 Generalized hyperhidrosis: Secondary | ICD-10-CM

## 2021-08-09 DIAGNOSIS — R319 Hematuria, unspecified: Secondary | ICD-10-CM | POA: Diagnosis not present

## 2021-08-09 MED ORDER — CYANOCOBALAMIN 1000 MCG/ML IJ SOLN
1000.0000 ug | Freq: Once | INTRAMUSCULAR | Status: AC
  Administered 2021-08-09: 1000 ug via INTRAMUSCULAR

## 2021-08-09 NOTE — Telephone Encounter (Signed)
-----   Message from Leone Haven, MD sent at 08/05/2021 12:15 PM EST ----- Regarding: RE: CT chest wo and CT ab pelvis wo This is a patient of Mamoudou Mulvehill's. I am forwarding this to her to look at.  ----- Message ----- From: Ashley Jacobs Sent: 08/05/2021   7:19 AM EST To: Leone Haven, MD Subject: CT chest wo and CT ab pelvis wo                Good morning!  Pt CT chest wo denied stating: A picture study taken without dye will not show your doctor all of the details they need in  order to treat you. A picture study with dye is needed. A detailed picture study (computed tomography or CT [id number]) of your chest with  pictures taken only after using a dye is more likely to show your doctor all they need to see  in order to treat you. This study will be approved if requested.  And the CT ab pelvis wo denied stating: A picture study taken without dye will not show your doctor all of the details they need in  order to treat you. A picture study with dye is needed. A detailed picture study (computed tomography or CT [id number]) of your abdomen and  pelvis with pictures taken only after using a dye is more likely to show your doctor all they  need to see in order to treat you. This study will be approved if requested.  If you place a order/referral for Ct chest w and Ct ab pelvis w it will be approved. Please advise and Thank you!

## 2021-08-09 NOTE — Telephone Encounter (Signed)
Please cancel order for CT chest without contrast, CT abdomen and pelvis without contrast.    I have consulted with radiologist today who recommended a combined CT chest abdomen and pelvis with contrast in the setting of concern for pathologic night sweats.   Please let me know when scheduled

## 2021-08-09 NOTE — Progress Notes (Signed)
Pt arrived for 2/4 B12 injection, given by Latoya,Wright CMA/XT @4 :10pm. Given in L deltoid. Pt tolerated injection well, showed no signs of distress nor voiced any concerns.

## 2021-08-10 LAB — URINE CULTURE
MICRO NUMBER:: 13037000
Result:: NO GROWTH
SPECIMEN QUALITY:: ADEQUATE

## 2021-08-10 LAB — URINALYSIS, ROUTINE W REFLEX MICROSCOPIC
Bilirubin Urine: NEGATIVE
Hgb urine dipstick: NEGATIVE
Ketones, ur: NEGATIVE
Leukocytes,Ua: NEGATIVE
Nitrite: NEGATIVE
RBC / HPF: NONE SEEN (ref 0–?)
Specific Gravity, Urine: 1.01 (ref 1.000–1.030)
Total Protein, Urine: NEGATIVE
Urine Glucose: NEGATIVE
Urobilinogen, UA: 0.2 (ref 0.0–1.0)
pH: 6 (ref 5.0–8.0)

## 2021-08-11 LAB — CELIAC DISEASE AB SCREEN W/RFX
Antigliadin Abs, IgA: 4 units (ref 0–19)
IgA/Immunoglobulin A, Serum: 102 mg/dL (ref 87–352)
Transglutaminase IgA: <2 U/mL (ref 0–3)

## 2021-08-12 ENCOUNTER — Encounter: Payer: Self-pay | Admitting: Family

## 2021-08-12 DIAGNOSIS — E538 Deficiency of other specified B group vitamins: Secondary | ICD-10-CM | POA: Insufficient documentation

## 2021-08-12 LAB — INTRINSIC FACTOR ANTIBODIES: Intrinsic Factor: NEGATIVE

## 2021-08-12 LAB — ANTI-PARIETAL ANTIBODY: PARIETAL CELL AB SCREEN: NEGATIVE

## 2021-08-12 LAB — HOMOCYSTEINE: Homocysteine: 10.1 umol/L (ref <10.4)

## 2021-08-14 NOTE — Addendum Note (Signed)
Addended by: Burnard Hawthorne on: 08/14/2021 07:44 AM   Modules accepted: Orders

## 2021-08-14 NOTE — Telephone Encounter (Signed)
Call pt  Advise that Dr Terri Piedra ( see below) has declined referral due to prior no shows  I have placed bilateral diagnostic mammogram and right breast US Please call [place] [facility] ASAP  We can arrange surgery consult based on above results

## 2021-08-15 NOTE — Telephone Encounter (Signed)
Left message at [facility] to call back to schedule appointment for [facility]

## 2021-08-16 ENCOUNTER — Other Ambulatory Visit: Payer: Self-pay | Admitting: Family

## 2021-08-16 DIAGNOSIS — F900 Attention-deficit hyperactivity disorder, predominantly inattentive type: Secondary | ICD-10-CM

## 2021-08-17 ENCOUNTER — Ambulatory Visit (INDEPENDENT_AMBULATORY_CARE_PROVIDER_SITE_OTHER): Payer: 59 | Admitting: Internal Medicine

## 2021-08-17 ENCOUNTER — Other Ambulatory Visit: Payer: Self-pay | Admitting: Family

## 2021-08-17 ENCOUNTER — Ambulatory Visit: Payer: 59 | Admitting: Internal Medicine

## 2021-08-17 ENCOUNTER — Ambulatory Visit (INDEPENDENT_AMBULATORY_CARE_PROVIDER_SITE_OTHER): Payer: 59 | Admitting: *Deleted

## 2021-08-17 ENCOUNTER — Encounter: Payer: Self-pay | Admitting: Internal Medicine

## 2021-08-17 ENCOUNTER — Other Ambulatory Visit: Payer: Self-pay

## 2021-08-17 VITALS — BP 110/68 | HR 67 | Ht 62.0 in | Wt 115.8 lb

## 2021-08-17 DIAGNOSIS — R61 Generalized hyperhidrosis: Secondary | ICD-10-CM

## 2021-08-17 DIAGNOSIS — N6452 Nipple discharge: Secondary | ICD-10-CM | POA: Diagnosis not present

## 2021-08-17 DIAGNOSIS — E538 Deficiency of other specified B group vitamins: Secondary | ICD-10-CM | POA: Diagnosis not present

## 2021-08-17 DIAGNOSIS — E039 Hypothyroidism, unspecified: Secondary | ICD-10-CM

## 2021-08-17 DIAGNOSIS — F900 Attention-deficit hyperactivity disorder, predominantly inattentive type: Secondary | ICD-10-CM

## 2021-08-17 LAB — PROLACTIN: Prolactin: 28.9 ng/mL

## 2021-08-17 LAB — T4, FREE: Free T4: 0.78 ng/dL (ref 0.60–1.60)

## 2021-08-17 LAB — TSH: TSH: 15 u[IU]/mL — ABNORMAL HIGH (ref 0.35–5.50)

## 2021-08-17 LAB — FOLLICLE STIMULATING HORMONE: FSH: 5.1 m[IU]/mL

## 2021-08-17 MED ORDER — CYANOCOBALAMIN 1000 MCG/ML IJ SOLN
1000.0000 ug | Freq: Once | INTRAMUSCULAR | Status: AC
  Administered 2021-08-17: 1000 ug via INTRAMUSCULAR

## 2021-08-17 MED ORDER — AMPHETAMINE-DEXTROAMPHETAMINE 5 MG PO TABS: 5.0000 mg | ORAL_TABLET | Freq: Two times a day (BID) | ORAL | 0 refills | Status: DC

## 2021-08-17 MED ORDER — VITAMIN D 25 MCG (1000 UNIT) PO TABS: 1000.0000 [IU] | ORAL_TABLET | Freq: Every day | ORAL | 0 refills | Status: DC

## 2021-08-17 MED ORDER — TIROSINT 137 MCG PO CAPS: 137.0000 ug | ORAL_CAPSULE | Freq: Every day | ORAL | 6 refills | Status: DC

## 2021-08-17 NOTE — Progress Notes (Signed)
I looked up patient on [place] Controlled Substances Reporting System PMP AWARE and saw no activity that raised concern of inappropriate use.   

## 2021-08-17 NOTE — Progress Notes (Signed)
Pt arrived for 3/4 B12 injection, given in R deltoid. Pt tolerated injection well, showed no signs of distress nor voiced any concerns.  ?

## 2021-08-17 NOTE — Progress Notes (Signed)
? ?Name: Rachel Alexander  ?MRN/ DOB: [id number], 04-Dec-1980    ?Age/ Sex: 41 y.o., female   ? ? ?PCP: Burnard Hawthorne, FNP   ?Reason for Endocrinology Evaluation: Hypothyroidism  ?   ?Initial Endocrinology Clinic Visit: 10/09/2019  ? ? ?PATIENT IDENTIFIER: Rachel Alexander is a 41 y.o., female with a past medical history of Depression, anxiety and hypothyroidism.  She has followed with [facility] since 10/09/2019 for consultative assistance with management of her hypothyroidism ? ? ? ? ? ?HISTORICAL SUMMARY:  ?Pt was diagnosed with hyperthyroidism and goiter at age 48, was put on oral medications ( does not recall ) but over the years  she became hypothyroid requiring LT-4 replacement. She was initially on armour thyroid but was switched synthroid  In 08/2019.  ? ?  ?Cousins with thyroid disease ?  ? ?SUBJECTIVE:  ? ? ?Today (08/17/2021):  [name] is here for a follow up on hypothyroidism . ? ? ?She has been fluctuating  ?Has occasional constipation but no  diarrhea  ?Denies palpitations  ?No local neck symptoms  ? ?Has noted mood swings , and night sweats described as drenching sweats  ? ?LMP last month regular  ? ? ? ?Was evaluated by PCP 07/2021 for nipple discharge , palpable abnormality on breast exam, was referred to surgery . Has  a pending mammogram on 3/15  ?Nipple discharge has been noted years ago on the right. Yellowish and foul smelling  ? ?Synthroid 125 mcg ,Two tabs on Saturday and Sundays and 1 tab rest of the week  ? ? ? ?HISTORY:  ?Past Medical History:  ?Past Medical History:  ?Diagnosis Date  ? Colitis   ? Depression   ? Frequent headaches   ? Hypothyroidism   ? Migraines   ? Pituitary tumor   ? Suicidal intent   ? ?Past Surgical History:  ?Past Surgical History:  ?Procedure Laterality Date  ? BREAST CYST ASPIRATION Right 2012  ? COLONOSCOPY N/A 08/24/2019  ? Procedure: COLONOSCOPY;  Surgeon: Jonathon Bellows, MD;  Location: [facility] END[facility]COPY;  Service: Gastroenterology;  Laterality: N/A;   ? TONSILLECTOMY    ? TUBAL LIGATION    ? ?Social History:  reports that she quit smoking about 3 years ago. Her smoking use included cigarettes. She smoked an average of 1 pack per day. She has never used smokeless tobacco. She reports current alcohol use. She reports that she does not use drugs. ?Family History:  ?Family History  ?Problem Relation Age of Onset  ? Healthy Mother   ? Liver cancer Father   ? Diabetes Sister   ? Breast cancer Maternal Aunt   ? ? ? ?HOME MEDICATIONS: ?Allergies as of 08/17/2021   ?No Known Allergies ?  ? ?  ?Medication List  ?  ? ?  ? Accurate as of August 17, 2021  3:04 PM. If you have any questions, ask your nurse or doctor.  ?  ?  ? ?  ? ?STOP taking these medications   ? ?levothyroxine 125 MCG tablet ?Commonly known as: Synthroid ?Replaced by: Tirosint [place] ?Stopped by: Dorita Sciara, MD ?  ? ?  ? ?TAKE these medications   ? ?ALPRAZolam 1 MG tablet ?Commonly known as: Duanne Moron ?Take 1 tablet (1 mg total) by mouth 2 (two) times daily as needed for anxiety. ?  ?amphetamine-dextroamphetamine 5 MG tablet ?Commonly known as: Adderall ?Take 1 tablet (5 mg total) by mouth 2 (two) times daily. ?  ?amphetamine-dextroamphetamine 5 MG  tablet ?Commonly known as: Adderall ?Take 1 tablet (5 mg total) by mouth 2 (two) times daily. ?  ?amphetamine-dextroamphetamine 5 MG tablet ?Commonly known as: Adderall ?Take 1 tablet (5 mg total) by mouth 2 (two) times daily. ?  ?cholecalciferol 25 MCG (1000 UNIT) tablet ?Commonly known as: VITAMIN D3 ?Take 1 tablet (1,000 Units total) by mouth daily. ?  ?citalopram 40 MG tablet ?Commonly known as: CELEXA ?Take 1 tablet (40 mg total) by mouth daily. ?  ?CRANBERRY PO ?Take by mouth. ?  ?SUMAtriptan 25 MG tablet ?Commonly known as: IMITREX ?TAKE 1 TAB BY MOUTH DAILY. MAY REPEAT ONCE 2 HRS LATER IF HEADACHE PERSISTS OR RECURS. MAX 2 DOSES. ?  ?Tirosint [place] ?Generic drug: Levothyroxine Sodium ?Take 1 capsule (137 mcg total) by mouth daily before  breakfast. ?Replaces: levothyroxine 125 MCG tablet ?Started by: Dorita Sciara, MD ?  ?valACYclovir 500 MG tablet ?Commonly known as: VALTREX ?TAKE 500 MG BY MOUTH X EVERY 12 HOURS X 3 DAYS. ASAP W/IN 24H OF SYMPTOMS ONSET. ?  ? ?  ? ? ? ? ?OBJECTIVE:  ? ?PHYSICAL EXAM: ?VS: BP 110/68 (BP Location: Left Arm, Patient Position: Sitting, Cuff Size: Small)   Pulse 67   Ht 5\' 2"  (1.575 m)   Wt 115 lb 12.8 oz (52.5 kg)   SpO2 95%   BMI 21.18 kg/m?   ? ?EXAM: ?General: Pt appears well and is in NAD  ?Neck: General: Supple without adenopathy. ?Thyroid: Thyroid size normal.  No goiter or nodules appreciated.  ?Lungs: Clear with good BS bilat with no rales, rhonchi, or wheezes  ?Heart: Auscultation: RRR.  ?Abdomen: Normoactive bowel sounds, soft, nontender, without masses or organomegaly palpable  ?Extremities:  ?BL LE: No pretibial edema normal ROM and strength.  ?Mental Status: Judgment, insight: Intact ?Orientation: Oriented to time, place, and person ?Mood and affect: No depression, anxiety, or agitation  ? ? ? ?DATA REVIEWED: ? ? Latest Reference Range & Units 08/17/21 08:00  ?FSH mIU/ML 5.1  ?TSH 0.35 - 5.50 uIU/mL 15.00 (H)  ?T4,Free(Direct) 0.60 - 1.60 ng/dL 0.78  ? ? ? ?ASSESSMENT / PLAN / RECOMMENDATIONS:  ? ?Hypothyroidism:  ? ? ?- Pt is clinically euthyroid  ?- She was advised to seperate collagen supplement from LT-4 replacement by 4 hours.  ?-Her TSH continues to trend up despite a stable free T4, I am going to try to resend as below ? ? ?Medications  ? ?Stop Synthroid 125 mcg ?Start Tirosint 137 mcg daily ? ?2.  Night sweats: ? ? ?-She has body imaging scheduled through her PCP ?-FSH normal ? ? ?3.  Nipple discharge: ? ? ?-This seems to be a chronic issue, the discharge is described as foul-smelling and yellow ?-Prolactin is normal ?-Patient will continue to follow-up with surgery ? ?F/U in 4 months  ?Labs in 8 weeks  ? ?Addendum: Discussed labs with pt on 02/14/2021 at 1605 ?Signed electronically  by: ?Abby Nena Jordan, MD ? ?[facility]  ?[facility] ?[address] ?[place], [place] [address] ?Phone: [phone] ?FAX: [phone]  ? ? ? ? ?CC: ?Burnard Hawthorne, FNP ?[phone] ?[place] [address] ?Phone: [phone]  ?Fax: [phone] ? ? ?Return to Endocrinology clinic as below: ?Future Appointments  ?Date Time Provider [place]  ?08/17/2021  4:00 PM [facility] NURSE [facility]  ?08/23/2021  4:15 PM [facility] NUR[facility]  ?08/24/2021  3:30 PM Burnard Hawthorne, FNP [facility]  ?08/31/2021  3:20  PM [facility]  ?08/31/2021  3:40 PM [facility] MM [facility]-US 1 [facility]  ?11/17/2021  7:30 AM Fraida Veldman, Melanie Crazier, MD [facility] None  ?  ? ?

## 2021-08-17 NOTE — Telephone Encounter (Signed)
Appointment scheduled for March 2023  at [facility]   ?

## 2021-08-23 ENCOUNTER — Ambulatory Visit (INDEPENDENT_AMBULATORY_CARE_PROVIDER_SITE_OTHER): Payer: 59 | Admitting: *Deleted

## 2021-08-23 ENCOUNTER — Other Ambulatory Visit: Payer: Self-pay

## 2021-08-23 DIAGNOSIS — E538 Deficiency of other specified B group vitamins: Secondary | ICD-10-CM

## 2021-08-23 MED ORDER — CYANOCOBALAMIN 1000 MCG/ML IJ SOLN
1000.0000 ug | Freq: Once | INTRAMUSCULAR | Status: AC
  Administered 2021-08-23: 1000 ug via INTRAMUSCULAR

## 2021-08-23 NOTE — Progress Notes (Signed)
Pt arrived for 4/4 B12 injection, given in L deltoid. Pt tolerated injection well, showed no signs of distress nor voiced any concerns. 

## 2021-08-24 ENCOUNTER — Other Ambulatory Visit: Payer: Self-pay

## 2021-08-24 ENCOUNTER — Encounter: Payer: Self-pay | Admitting: Family

## 2021-08-24 ENCOUNTER — Ambulatory Visit (INDEPENDENT_AMBULATORY_CARE_PROVIDER_SITE_OTHER): Payer: Self-pay | Admitting: Family

## 2021-08-24 DIAGNOSIS — R61 Generalized hyperhidrosis: Secondary | ICD-10-CM

## 2021-08-24 DIAGNOSIS — F419 Anxiety disorder, unspecified: Secondary | ICD-10-CM

## 2021-08-24 DIAGNOSIS — F32A Depression, unspecified: Secondary | ICD-10-CM

## 2021-08-24 DIAGNOSIS — E538 Deficiency of other specified B group vitamins: Secondary | ICD-10-CM

## 2021-08-24 NOTE — Progress Notes (Signed)
? ?Subjective:  ? ? Patient ID: Jacalynn Buzzell, female    DOB: 1980-09-19, 41 y.o.   MRN: [id number] ? ?CC: Cleopha Indelicato is a 41 y.o. female who presents today for follow up.  ? ?HPI:  ?Feels well today.  Follow-up on chronic complaints.  No new concerns at this time.  ? ? ?Night sweats remain unchanged.  No unusual bone pain, fever or weight loss.   ?ct chest abdomen and pelvis is scheduled 09/05/21 ?Macrocytic anemia- low end b12. Currently doing monthly b12.  ?Menses are light. She has occasional rectal bleeding. BRB. No blood in stool. She will blood on toilet paper. Last episode a few weeks ago when she saw blood and is related to constipation. No hemorroids on colonoscopy 08/25/2019. Taking colace qhs. she hasnt returned Stool cards ? ?Anxiety depression-compliant with Celexa 40 mg.  Referral placed to psychiatry and she has new patient paperwork to complete.  She plans to complete paperwork on her own so she may schedule with psychiatry ? ?Diagnostic mammograms are scheduled 08/31/2021 ?HISTORY:  ?Past Medical History:  ?Diagnosis Date  ? Colitis   ? Depression   ? Frequent headaches   ? Hypothyroidism   ? Migraines   ? Pituitary tumor   ? Suicidal intent   ? ?Past Surgical History:  ?Procedure Laterality Date  ? BREAST CYST ASPIRATION Right 2012  ? COLONOSCOPY N/A 08/24/2019  ? Procedure: COLONOSCOPY;  Surgeon: Jonathon Bellows, MD;  Location: [facility] ENDOSCOPY;  Service: Gastroenterology;  Laterality: N/A;  ? TONSILLECTOMY    ? TUBAL LIGATION    ? ?Family History  ?Problem Relation Age of Onset  ? Healthy Mother   ? Liver cancer Father   ? Diabetes Sister   ? Breast cancer Maternal Aunt   ? ? ?Allergies: Patient has no known allergies. ?Current Outpatient Medications on File Prior to Visit  ?Medication Sig Dispense Refill  ? ALPRAZolam (XANAX) 1 MG tablet Take 1 tablet (1 mg total) by mouth 2 (two) times daily as needed for anxiety. 60 tablet 2  ? amphetamine-dextroamphetamine (ADDERALL) 5 MG tablet Take 1 tablet (5 mg  total) by mouth 2 (two) times daily. 60 tablet 0  ? amphetamine-dextroamphetamine (ADDERALL) 5 MG tablet Take 1 tablet (5 mg total) by mouth 2 (two) times daily. 60 tablet 0  ? amphetamine-dextroamphetamine (ADDERALL) 5 MG tablet Take 1 tablet (5 mg total) by mouth 2 (two) times daily. 60 tablet 0  ? cholecalciferol (VITAMIN D3) 25 MCG (1000 UNIT) tablet Take 1 tablet (1,000 Units total) by mouth daily. 8 tablet 0  ? citalopram (CELEXA) 40 MG tablet Take 1 tablet (40 mg total) by mouth daily. 90 tablet 2  ? CRANBERRY PO Take by mouth.    ? SUMAtriptan (IMITREX) 25 MG tablet TAKE 1 TAB BY MOUTH DAILY. MAY REPEAT ONCE 2 HRS LATER IF HEADACHE PERSISTS OR RECURS. MAX 2 DOSES. 9 tablet 2  ? TIROSINT 137 MCG CAPS Take 1 capsule (137 mcg total) by mouth daily before breakfast. 30 capsule 6  ? valACYclovir (VALTREX) 500 MG tablet TAKE 500 MG BY MOUTH X EVERY 12 HOURS X 3 DAYS. ASAP W/IN 24H OF SYMPTOMS ONSET. 18 tablet 2  ? [DISCONTINUED] dicyclomine (BENTYL) 20 MG tablet Take 1 tablet (20 mg total) by mouth 2 (two) times daily. 20 tablet 0  ? ?No current facility-administered medications on file prior to visit.  ? ? ?Social History  ? ?Tobacco Use  ? Smoking status: Former  ?  Packs/day: 1.00  ?  Types: Cigarettes  ?  Quit date: 10/06/2017  ?  Years since quitting: 3.8  ? Smokeless tobacco: Never  ?Vaping Use  ? Vaping Use: Every day  ?Substance Use Topics  ? Alcohol use: Yes  ?  Comment: occ  ? Drug use: No  ? ? ?Review of Systems  ?Constitutional:  Positive for diaphoresis. Negative for chills, fever and unexpected weight change.  ?Respiratory:  Negative for cough.   ?Cardiovascular:  Negative for chest pain and palpitations.  ?Gastrointestinal:  Negative for nausea and vomiting.  ?   ?Objective:  ?  ?BP 118/72 (BP Location: Left Arm, Patient Position: Sitting, Cuff Size: Normal)   Pulse 84   Temp 98.1 ?F (36.7 ?C) (Temporal)   Ht 5' (1.524 m)   Wt 116 lb 6.4 oz (52.8 kg)   LMP 08/20/2021 (Exact Date)   SpO2 95%    BMI 22.73 kg/m?  ?BP Readings from Last 3 Encounters:  ?08/24/21 118/72  ?08/17/21 110/68  ?07/27/21 98/62  ? ?Wt Readings from Last 3 Encounters:  ?08/24/21 116 lb 6.4 oz (52.8 kg)  ?08/17/21 115 lb 12.8 oz (52.5 kg)  ?07/27/21 112 lb 9.6 oz (51.1 kg)  ? ? ?Physical Exam ?Vitals reviewed.  ?Constitutional:   ?   Appearance: She is well-developed.  ?Eyes:  ?   Conjunctiva/sclera: Conjunctivae normal.  ?Cardiovascular:  ?   Rate and Rhythm: Normal rate and regular rhythm.  ?   Pulses: Normal pulses.  ?   Heart sounds: Normal heart sounds.  ?Pulmonary:  ?   Effort: Pulmonary effort is normal.  ?   Breath sounds: Normal breath sounds. No wheezing, rhonchi or rales.  ?Skin: ?   General: Skin is warm and dry.  ?Neurological:  ?   Mental Status: She is alert.  ?Psychiatric:     ?   Speech: Speech normal.     ?   Behavior: Behavior normal.     ?   Thought Content: Thought content normal.  ? ? ?   ?Assessment & Plan:  ? ?Problem List Items Addressed This Visit   ? ?  ? Other  ? Anxiety and depression  ?  Suboptimal control.  Continue Celexa 40 mg.  She will complete paperwork so she can schedule psychiatry.  Will follow ?  ?  ? B12 deficiency  ?  Macrocytic anemia.  Currently doing monthly B12 injections.  Strongly emphasized the importance of returning stool cards.   will follow closely ?  ?  ? Night sweats  ?  Unknown etiology.  Pending malignancy work-up with CT chest abdomen and pelvis.  Diagnostic mammograms are scheduled 08/31/2021. ?  ?  ? ? ? ?I am having Tannah Marcoux "[name]" maintain her CRANBERRY PO, citalopram, valACYclovir, ALPRAZolam, SUMAtriptan, cholecalciferol, amphetamine-dextroamphetamine, amphetamine-dextroamphetamine, amphetamine-dextroamphetamine, and Tirosint. ? ? ?No orders of the defined types were placed in this encounter. ? ? ?Return precautions given.  ? ?Risks, benefits, and alternatives of the medications and treatment plan prescribed today were discussed, and patient expressed understanding.   ? ?Education regarding symptom management and diagnosis given to patient on AVS. ? ?Continue to follow with Burnard Hawthorne, FNP for routine health maintenance.  ? ?Justine Null and I agreed with plan.  ? ?Mable Paris, FNP ? ? ?

## 2021-08-24 NOTE — Patient Instructions (Signed)
Please continue MiraLAX, stool softener as you are doing.  Please return stool cards to our office as soon as possible. ? ? ?

## 2021-08-26 NOTE — Assessment & Plan Note (Signed)
Unknown etiology.  Pending malignancy work-up with CT chest abdomen and pelvis.  Diagnostic mammograms are scheduled 08/31/2021. ?

## 2021-08-26 NOTE — Assessment & Plan Note (Addendum)
Macrocytic anemia.  Currently doing monthly B12 injections.  Strongly emphasized the importance of returning stool cards.   will follow closely ?

## 2021-08-26 NOTE — Assessment & Plan Note (Signed)
Suboptimal control.  Continue Celexa 40 mg.  She will complete paperwork so she can schedule psychiatry.  Will follow ?

## 2021-08-31 ENCOUNTER — Ambulatory Visit
Admission: RE | Admit: 2021-08-31 | Discharge: 2021-08-31 | Disposition: A | Discharge status: Home / Self Care | Payer: 59 | Source: Ambulatory Visit | Attending: Family | Admitting: Family

## 2021-08-31 ENCOUNTER — Other Ambulatory Visit: Payer: Self-pay

## 2021-08-31 DIAGNOSIS — N6452 Nipple discharge: Secondary | ICD-10-CM | POA: Insufficient documentation

## 2021-08-31 DIAGNOSIS — R922 Inconclusive mammogram: Secondary | ICD-10-CM | POA: Diagnosis not present

## 2021-09-05 ENCOUNTER — Ambulatory Visit: Payer: 59

## 2021-09-21 ENCOUNTER — Other Ambulatory Visit: Payer: Self-pay | Admitting: Family

## 2021-09-21 DIAGNOSIS — F900 Attention-deficit hyperactivity disorder, predominantly inattentive type: Secondary | ICD-10-CM

## 2021-09-22 ENCOUNTER — Encounter: Payer: Self-pay | Admitting: Family

## 2021-09-22 ENCOUNTER — Other Ambulatory Visit: Payer: Self-pay | Admitting: Family

## 2021-09-22 DIAGNOSIS — F900 Attention-deficit hyperactivity disorder, predominantly inattentive type: Secondary | ICD-10-CM

## 2021-09-22 NOTE — Progress Notes (Signed)
close

## 2021-09-23 ENCOUNTER — Other Ambulatory Visit: Payer: Self-pay | Admitting: Family

## 2021-09-23 DIAGNOSIS — F900 Attention-deficit hyperactivity disorder, predominantly inattentive type: Secondary | ICD-10-CM

## 2021-09-28 ENCOUNTER — Telehealth: Payer: Self-pay | Admitting: Family

## 2021-09-28 ENCOUNTER — Ambulatory Visit (INDEPENDENT_AMBULATORY_CARE_PROVIDER_SITE_OTHER): Payer: 59

## 2021-09-28 ENCOUNTER — Other Ambulatory Visit: Payer: Self-pay | Admitting: Family

## 2021-09-28 DIAGNOSIS — E538 Deficiency of other specified B group vitamins: Secondary | ICD-10-CM

## 2021-09-28 MED ORDER — CYANOCOBALAMIN 1000 MCG/ML IJ SOLN: 1000.0000 ug | Freq: Once | INTRAMUSCULAR | Status: DC

## 2021-09-28 MED ORDER — CYANOCOBALAMIN 1000 MCG/ML IJ SOLN
1000.0000 ug | Freq: Once | INTRAMUSCULAR | Status: AC
  Administered 2021-09-28: 1000 ug via INTRAMUSCULAR

## 2021-09-28 NOTE — Telephone Encounter (Signed)
Patient is requesting  a refill on her ALPRAZolam (XANAX) 1 MG tablet and amphetamine-dextroamphetamine (ADDERALL) 5 MG tablet. Last seen by Arnett on 08/24/2021. ?

## 2021-09-28 NOTE — Progress Notes (Signed)
Patient presented for B 12 injection to right deltoid, patient voiced no concerns nor showed any signs of distress during injection. 

## 2021-09-29 ENCOUNTER — Encounter: Payer: Self-pay | Admitting: Family

## 2021-09-29 NOTE — Telephone Encounter (Signed)
Pt calling in again... Pt requesting refill on medication (ALPRAZolam (XANAX) 1 MG tablet)... Pt requesting callback...  ?

## 2021-09-29 NOTE — Telephone Encounter (Signed)
Last OV 08/24/2021 Alprazolam refilled last 07/05/21 and last fill adderall 08/17/21 ?

## 2021-09-30 ENCOUNTER — Other Ambulatory Visit: Payer: Self-pay | Admitting: Family

## 2021-09-30 DIAGNOSIS — F419 Anxiety disorder, unspecified: Secondary | ICD-10-CM

## 2021-09-30 MED ORDER — ALPRAZOLAM 1 MG PO TABS: 1.0000 mg | ORAL_TABLET | Freq: Two times a day (BID) | ORAL | 2 refills | Status: DC | PRN

## 2021-09-30 NOTE — Progress Notes (Signed)
I looked up patient on [place] Controlled Substances Reporting System PMP AWARE and saw no activity that raised concern of inappropriate use.   

## 2021-09-30 NOTE — Telephone Encounter (Signed)
Refilled xanax ?Prescriptions are available for adderall ; patient has read mychart message in regards to this ?

## 2021-10-10 ENCOUNTER — Encounter: Payer: Self-pay | Admitting: Family

## 2021-10-22 ENCOUNTER — Other Ambulatory Visit: Payer: Self-pay | Admitting: Family

## 2021-10-22 DIAGNOSIS — R519 Headache, unspecified: Secondary | ICD-10-CM

## 2021-10-28 ENCOUNTER — Other Ambulatory Visit: Payer: Self-pay | Admitting: Family

## 2021-10-28 DIAGNOSIS — F900 Attention-deficit hyperactivity disorder, predominantly inattentive type: Secondary | ICD-10-CM

## 2021-10-31 ENCOUNTER — Ambulatory Visit: Payer: 59

## 2021-10-31 ENCOUNTER — Ambulatory Visit (INDEPENDENT_AMBULATORY_CARE_PROVIDER_SITE_OTHER): Payer: 59

## 2021-10-31 DIAGNOSIS — E538 Deficiency of other specified B group vitamins: Secondary | ICD-10-CM

## 2021-10-31 MED ORDER — CYANOCOBALAMIN 1000 MCG/ML IJ SOLN
1000.0000 ug | Freq: Once | INTRAMUSCULAR | Status: AC
  Administered 2021-10-31: 1000 ug via INTRAMUSCULAR

## 2021-10-31 NOTE — Progress Notes (Signed)
Patient presented for B 12 injection to right deltoid, patient voiced no concerns nor showed any signs of distress during injection. 

## 2021-11-01 NOTE — Telephone Encounter (Signed)
Rachel Alexander ?Can we get CT chest and abdomen asap? ? ?

## 2021-11-02 ENCOUNTER — Other Ambulatory Visit: Payer: Self-pay | Admitting: Family

## 2021-11-02 ENCOUNTER — Telehealth: Payer: Self-pay | Admitting: Family

## 2021-11-02 DIAGNOSIS — F900 Attention-deficit hyperactivity disorder, predominantly inattentive type: Secondary | ICD-10-CM

## 2021-11-02 MED ORDER — AMPHETAMINE-DEXTROAMPHETAMINE 5 MG PO TABS: 5.0000 mg | ORAL_TABLET | Freq: Two times a day (BID) | ORAL | 0 refills | Status: DC

## 2021-11-02 NOTE — Telephone Encounter (Signed)
Spoke to patient and scheduled appointment for 02/15/22 ?

## 2021-11-02 NOTE — Telephone Encounter (Signed)
Call pt ?I refilled adderall ?She is due for adhd follow 3 months from prior appt ?Please sch ?

## 2021-11-02 NOTE — Telephone Encounter (Signed)
I went back into pt auth info it was approved for the scan denied for the location. I left pt a vm to confirm where exactly she would like to go according to what her ins says in order for this to be approved. The scan as been approved since 08/15/21-02/11/2022 pt and I both was on the phone during that time trying to get some assistance with locations approval.  ?

## 2021-11-13 ENCOUNTER — Emergency Department
Admission: EM | Admit: 2021-11-13 | Discharge: 2021-11-13 | Disposition: A | Discharge status: Home / Self Care | Payer: 59 | Attending: Emergency Medicine | Admitting: Emergency Medicine

## 2021-11-13 ENCOUNTER — Emergency Department: Payer: 59

## 2021-11-13 DIAGNOSIS — R109 Unspecified abdominal pain: Secondary | ICD-10-CM | POA: Diagnosis not present

## 2021-11-13 DIAGNOSIS — E039 Hypothyroidism, unspecified: Secondary | ICD-10-CM | POA: Diagnosis not present

## 2021-11-13 DIAGNOSIS — K760 Fatty (change of) liver, not elsewhere classified: Secondary | ICD-10-CM | POA: Diagnosis not present

## 2021-11-13 DIAGNOSIS — R1013 Epigastric pain: Secondary | ICD-10-CM | POA: Insufficient documentation

## 2021-11-13 LAB — COMPREHENSIVE METABOLIC PANEL
ALT: 15 U/L (ref 0–44)
AST: 23 U/L (ref 15–41)
Albumin: 4.2 g/dL (ref 3.5–5.0)
Alkaline Phosphatase: 37 U/L — ABNORMAL LOW (ref 38–126)
Anion gap: 8 (ref 5–15)
BUN: 10 mg/dL (ref 6–20)
CO2: 27 mmol/L (ref 22–32)
Calcium: 9.2 mg/dL (ref 8.9–10.3)
Chloride: 103 mmol/L (ref 98–111)
Creatinine, Ser: 0.63 mg/dL (ref 0.44–1.00)
GFR, Estimated: >60 mL/min (ref >60)
Glucose, Bld: 95 mg/dL (ref 70–99)
Potassium: 3.7 mmol/L (ref 3.5–5.1)
Sodium: 138 mmol/L (ref 135–145)
Total Bilirubin: 0.6 mg/dL (ref 0.3–1.2)
Total Protein: 6.9 g/dL (ref 6.5–8.1)

## 2021-11-13 LAB — CBC
HCT: 41 % (ref 36.0–46.0)
Hemoglobin: 13.4 g/dL (ref 12.0–15.0)
MCH: 33.3 pg (ref 26.0–34.0)
MCHC: 32.7 g/dL (ref 30.0–36.0)
MCV: 102 fL — ABNORMAL HIGH (ref 80.0–100.0)
Platelets: 223 10*3/uL (ref 150–400)
RBC: 4.02 MIL/uL (ref 3.87–5.11)
RDW: 11.9 % (ref 11.5–15.5)
WBC: 6.5 10*3/uL (ref 4.0–10.5)
nRBC: 0 % (ref 0.0–0.2)

## 2021-11-13 LAB — URINALYSIS, ROUTINE W REFLEX MICROSCOPIC
Bilirubin Urine: NEGATIVE
Glucose, UA: NEGATIVE mg/dL
Hgb urine dipstick: NEGATIVE
Ketones, ur: NEGATIVE mg/dL
Leukocytes,Ua: NEGATIVE
Nitrite: NEGATIVE
Protein, ur: NEGATIVE mg/dL
Specific Gravity, Urine: 1.006 (ref 1.005–1.030)
pH: 6 (ref 5.0–8.0)

## 2021-11-13 LAB — TROPONIN I (HIGH SENSITIVITY)
Troponin I (High Sensitivity): <2 ng/L (ref <18)
Troponin I (High Sensitivity): 3 ng/L (ref <18)

## 2021-11-13 LAB — POC URINE PREG, ED: Preg Test, Ur: NEGATIVE

## 2021-11-13 LAB — PREGNANCY, URINE: Preg Test, Ur: NEGATIVE

## 2021-11-13 LAB — LIPASE, BLOOD: Lipase: 29 U/L (ref 11–51)

## 2021-11-13 MED ORDER — SODIUM CHLORIDE 0.9 % IV BOLUS
1000.0000 mL | Freq: Once | INTRAVENOUS | Status: AC
  Administered 2021-11-13: 1000 mL via INTRAVENOUS

## 2021-11-13 MED ORDER — OMEPRAZOLE 40 MG PO CPDR: 40.0000 mg | DELAYED_RELEASE_CAPSULE | Freq: Every day | ORAL | 0 refills | Status: DC

## 2021-11-13 MED ORDER — IOHEXOL 300 MG/ML  SOLN
80.0000 mL | Freq: Once | INTRAMUSCULAR | Status: AC | PRN
  Administered 2021-11-13: 80 mL via INTRAVENOUS

## 2021-11-13 MED ORDER — FAMOTIDINE IN NACL 20-0.9 MG/50ML-% IV SOLN
20.0000 mg | Freq: Once | INTRAVENOUS | Status: AC
  Administered 2021-11-13: 20 mg via INTRAVENOUS
  Filled 2021-11-13: qty 50

## 2021-11-13 MED ORDER — SUCRALFATE 1 GM/10ML PO SUSP: 1.0000 g | Freq: Four times a day (QID) | ORAL | 0 refills | Status: DC | PRN

## 2021-11-13 NOTE — ED Notes (Signed)
Provider saw pt at bedside. Pt complains of burning pain and bloating after eating to mid epigastric area and under L rib area. States pain and discomfort have been ongoing for 1 year but acutely worse since 1 week.

## 2021-11-13 NOTE — ED Provider Notes (Signed)
[facility] Provider Note    Event Date/Time   First MD Initiated Contact with Patient 11/13/21 1311     (approximate)   History   Abdominal Pain   HPI  Rachel Alexander is a 41 y.o. female with history of colitis, hypothyroid, and as listed below presents to the emergency department for treatment and evaluation of abdominal pain and diarrhea.  Symptoms have been intermittent for the past several weeks but over this past week has been more persistent.  Epigastric pain and bloating increase after eating.  Pain radiates from the epigastric area into the left lower quadrant.  She had some nausea but no vomiting.  No fever.  Past Medical History:  Diagnosis Date   Colitis    Depression    Frequent headaches    Hypothyroidism    Migraines    Pituitary tumor    Suicidal intent        Physical Exam   Triage Vital Signs: ED Triage Vitals [11/13/21 1308]  Enc Vitals Group     BP 115/79     Pulse Rate 72     Resp 18     Temp 98.4 F (36.9 C)     Temp Source Oral     SpO2 100 %     Weight 115 lb (52.2 kg)     Height      Head Circumference      Peak Flow      Pain Score 8     Pain Loc      Pain Edu?      Excl. in [place]?     Most recent vital signs: Vitals:   11/13/21 1447 11/13/21 1449  BP: 95/65   Pulse: (!) 54 (!) 50  Resp: 16 (!) 23  Temp:    SpO2: 100% 100%    General: Awake, no distress.  CV:  Good peripheral perfusion.  Resp:  Normal effort.  Abd:  No distention. Soft, non-tender; Bowel sounds active x 4 quadrants Other:     ED Results / Procedures / Treatments   Labs (all labs ordered are listed, but only abnormal results are displayed) Labs Reviewed  COMPREHENSIVE METABOLIC PANEL - Abnormal; Notable for the following components:      Result Value   Alkaline Phosphatase 37 (*)    All other components within normal limits  CBC - Abnormal; Notable for the following components:   MCV 102.0 (*)    All other components within  normal limits  URINALYSIS, ROUTINE W REFLEX MICROSCOPIC - Abnormal; Notable for the following components:   Color, Urine STRAW (*)    APPearance HAZY (*)    All other components within normal limits  LIPASE, BLOOD  PREGNANCY, URINE  POC URINE PREG, ED  TROPONIN I (HIGH SENSITIVITY)  TROPONIN I (HIGH SENSITIVITY)     EKG  Not indicated.   RADIOLOGY  CT abdomen and pelvis is negative for acute concerns.  I have independently reviewed and interpreted imaging as well as reviewed report from radiology.  PROCEDURES:  Critical Care performed: No  Procedures   MEDICATIONS ORDERED IN ED:  Medications  famotidine (PEPCID) IVPB 20 mg premix (0 mg Intravenous Stopped 11/13/21 1415)  sodium chloride 0.9 % bolus 1,000 mL (0 mLs Intravenous Stopped 11/13/21 1448)  iohexol (OMNIPAQUE) 300 MG/ML solution 80 mL (80 mLs Intravenous Contrast Given 11/13/21 1504)     IMPRESSION / MDM / ASSESSMENT AND PLAN / ED COURSE   I reviewed the triage  vital signs and the nursing notes.                              Differential diagnosis includes, but is not limited to: GERD, peptic ulcer disease, colitis, cholelithiasis, ileus, small bowel obstruction  41 year old female presenting to the emergency department for treatment and evaluation of symptoms as described in the HPI.  Plan will be to get some labs and ultimately a CT of the abdomen and pelvis.  Patient is agreeable to this plan.  CT of the abdomen and pelvis with contrast, lab studies, and urinalysis are all reassuring.  IV fluids and Pepcid administered here to while in the emergency department.  Patient feels some better.  Plan will be to discharge her home to follow-up with GI.  She will be started on omeprazole daily and given Carafate to use as needed.  Patient is comfortable and agreeable to this plan.  She is to return to the emergency department for symptoms that change or worsen if she is unable to see primary care or the GI specialist  right away.      FINAL CLINICAL IMPRESSION(S) / ED DIAGNOSES   Final diagnoses:  Epigastric pain     Rx / DC Orders   ED Discharge Orders          Ordered    omeprazole (PRILOSEC) 40 MG capsule  Daily        11/13/21 1603    sucralfate (CARAFATE) 1 GM/10ML suspension  4 times daily PRN        11/13/21 1603             Note:  This document was prepared using Dragon voice recognition software and may include unintentional dictation errors.   Victorino Dike, FNP 11/13/21 1609    Naaman Plummer, MD 11/14/21 [phone]

## 2021-11-13 NOTE — ED Triage Notes (Signed)
Pt comes pov with abd pain more left sided and moves to her back. Had some diarrhea this morning.

## 2021-11-13 NOTE — ED Notes (Signed)
Pt in CT.

## 2021-11-16 ENCOUNTER — Encounter: Payer: Self-pay | Admitting: Family

## 2021-11-17 ENCOUNTER — Ambulatory Visit: Payer: 59 | Admitting: Internal Medicine

## 2021-11-18 ENCOUNTER — Telehealth (INDEPENDENT_AMBULATORY_CARE_PROVIDER_SITE_OTHER): Payer: 59 | Admitting: Family

## 2021-11-18 ENCOUNTER — Encounter: Payer: Self-pay | Admitting: Family

## 2021-11-18 VITALS — Ht 60.0 in

## 2021-11-18 DIAGNOSIS — F419 Anxiety disorder, unspecified: Secondary | ICD-10-CM

## 2021-11-18 DIAGNOSIS — F900 Attention-deficit hyperactivity disorder, predominantly inattentive type: Secondary | ICD-10-CM | POA: Diagnosis not present

## 2021-11-18 DIAGNOSIS — F32A Depression, unspecified: Secondary | ICD-10-CM

## 2021-11-18 DIAGNOSIS — R1013 Epigastric pain: Secondary | ICD-10-CM | POA: Diagnosis not present

## 2021-11-18 DIAGNOSIS — K760 Fatty (change of) liver, not elsewhere classified: Secondary | ICD-10-CM | POA: Diagnosis not present

## 2021-11-18 DIAGNOSIS — G43909 Migraine, unspecified, not intractable, without status migrainosus: Secondary | ICD-10-CM

## 2021-11-18 MED ORDER — FLUOXETINE HCL 20 MG PO CAPS: 20.0000 mg | ORAL_CAPSULE | Freq: Every morning | ORAL | 3 refills | Status: DC

## 2021-11-18 NOTE — Assessment & Plan Note (Signed)
Unchanged.  Patient been taking omeprazole 40 mg qpm..  Advised her to take this 1 hour before breakfast and separated by one hour from her Synthroid.  Symptom does appear to be related to eating. Close follow up.

## 2021-11-18 NOTE — Assessment & Plan Note (Signed)
Hepatic steatosis seen on CT a/p 11/14/21. Discussed lifestyle modifications to thwart progression.  She is not have a history of hypertension, DM, HLD.  Her father has a history of liver cancer.  I have placed referral back to Dr Vicente Males for further surveillance, evaluation if needed. Will follow.

## 2021-11-18 NOTE — Assessment & Plan Note (Signed)
Suspending adderall at this time due to weight loss.

## 2021-11-18 NOTE — Progress Notes (Signed)
Virtual Visit via Video Note  I connected with@  on 11/18/21 at 12:00 PM EDT by a video enabled telemedicine application and verified that I am speaking with the correct person using two identifiers.  Location patient: work [employer]  Persons participating in the virtual visit: patient, provider  I discussed the limitations of evaluation and management by telemedicine and the availability of in person appointments. The patient expressed understanding and agreed to proceed.   HPI: She complains epigastric pain which radiates to left lower abdomen. Endorses excess gas .  Abdominal pain started 10 days ago. Pain is worse a few minutes after she eats. She is eating less and reports appetite has decreased. She has lost 4 lbs unintentionally. She is taking adderall '5mg'$  QD, versus BID.   Compliant with omeprazole '40mg'$  qpm.   When depressed, doesn't feel she eats as much.  Loves coming to work. Children are doing well. She has  moved back in with estranged husband. He is nice to her but her children dont feel welcome and it is stressful for her.   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  Dilatation.  Bowel wall thickening inflammatory change uterus and bilateral adnexa unremarkable   started on omeprazole 40 mg daily Urinalysis negative for blood ROS: See pertinent positives and negatives per HPI.    EXAM:  VITALS per patient if applicable: Ht 5' (2.841 m)   BMI 22.46 kg/m  BP Readings from Last 3 Encounters:  11/13/21 94/69  08/24/21 118/72  08/17/21 110/68   Wt Readings from Last 3 Encounters:  11/13/21 115 lb (52.2 kg)  08/24/21 116 lb 6.4 oz (52.8 kg)  08/17/21 115 lb 12.8 oz (52.5 kg)    GENERAL: alert, oriented, appears well and in no acute distress  HEENT: atraumatic, conjunttiva clear, no obvious abnormalities on inspection of external nose and ears  NECK: normal movements  of the head and neck  LUNGS: on inspection no signs of respiratory distress, breathing rate appears normal, no obvious gross SOB, gasping or wheezing  CV: no obvious cyanosis  MS: moves all visible extremities without noticeable abnormality  PSYCH/NEURO: pleasant and cooperative, no obvious depression or anxiety, speech and thought processing grossly intact  ASSESSMENT AND PLAN:  Discussed the following assessment and plan:  Problem List Items Addressed This Visit       Cardiovascular and Mediastinum   Migraines   Relevant Medications   FLUoxetine (PROZAC) 20 MG capsule     Digestive   Hepatic steatosis    Hepatic steatosis seen on CT a/p 11/14/21. Discussed lifestyle modifications to thwart progression.  She is not have a history of hypertension, DM, HLD.  Her father has a history of liver cancer.  I have placed referral back to Dr Vicente Males for further surveillance, evaluation if needed. Will follow.       Relevant Orders   Ambulatory referral to Gastroenterology     Other   Anxiety and depression    Uncontrolled.  Decreased appetite which we talked about being a symptom of depression and also likely a side effect of Adderall.  We agreed to suspend Adderall at this time.  Patient will wean from Celexa 40 mg Celexa 20 mg and after 5 days she will switch to Prozac 20 mg.   Close follow-up       Relevant Medications   FLUoxetine (PROZAC) 20 MG capsule   Attention deficit hyperactivity disorder, predominantly inattentive  type    Suspending adderall at this time due to weight loss.        Epigastric pain - Primary    Unchanged.  Patient been taking omeprazole 40 mg qpm..  Advised her to take this 1 hour before breakfast and separated by one hour from her Synthroid.  Symptom does appear to be related to eating. Close follow up.         -we discussed possible serious and likely etiologies, options for evaluation and workup, limitations of telemedicine visit vs in person  visit, treatment, treatment risks and precautions. Pt prefers to treat via telemedicine empirically rather then risking or undertaking an in person visit at this moment.  .   I discussed the assessment and treatment plan with the patient. The patient was provided an opportunity to ask questions and all were answered. The patient agreed with the plan and demonstrated an understanding of the instructions.   The patient was advised to call back or seek an in-person evaluation if the symptoms worsen or if the condition fails to improve as anticipated.   Mable Paris, FNP

## 2021-11-18 NOTE — Patient Instructions (Addendum)
Stop adderall for 3-4 weeks   Start celexa '20mg'$  today for one week, then stop celexa, start prozac '20mg'$ .   Start taking omeprazole '40mg'$  one hour before and separated by 1 hour with the synthroid.

## 2021-11-18 NOTE — Assessment & Plan Note (Signed)
Uncontrolled.  Decreased appetite which we talked about being a symptom of depression and also likely a side effect of Adderall.  We agreed to suspend Adderall at this time.  Patient will wean from Celexa 40 mg Celexa 20 mg and after 5 days she will switch to Prozac 20 mg.   Close follow-up

## 2021-11-22 ENCOUNTER — Encounter: Payer: Self-pay | Admitting: Family

## 2021-11-28 ENCOUNTER — Telehealth: Payer: Self-pay | Admitting: Family

## 2021-11-28 NOTE — Telephone Encounter (Signed)
Spoke to patient in regards to her message about her headaches and thyroids. Patient stated that her Thyroid  medication had been changed and she is wondering is this the reason for her now having headaches,shortness of breath fatigue. Would like to know if you can up her doseage of Imitrex or does she need to come in to the office?

## 2021-11-28 NOTE — Telephone Encounter (Signed)
Pt called stating she has bad headaches want her thyroids checked. Pt stated she wanted to talk to [place] regarding this

## 2021-11-29 ENCOUNTER — Telehealth: Payer: Self-pay

## 2021-11-29 NOTE — Telephone Encounter (Signed)
Noted Patient declines appointment  Please advise that for anxiety, I would recommend increasing prozac from '20mg'$  to '30mg'$  qd.  Please send in prozac '10mg'$  to take with '20mg'$   tablet at same time if she is agreeable

## 2021-11-29 NOTE — Telephone Encounter (Signed)
Can we get pt scheduled with me this week? I dont think thyroid would explain sob.   Please triage sob and get more info.  If she is having sudden sob, and most certainly if associated with chest pain, palpitations, dizziness, she would need to be seen in ED.

## 2021-11-29 NOTE — Telephone Encounter (Signed)
SEE phone note in chart from 11/29/21

## 2021-11-29 NOTE — Telephone Encounter (Signed)
Lmtcb to office

## 2021-12-01 ENCOUNTER — Other Ambulatory Visit: Payer: Self-pay

## 2021-12-01 ENCOUNTER — Ambulatory Visit (INDEPENDENT_AMBULATORY_CARE_PROVIDER_SITE_OTHER): Payer: 59

## 2021-12-01 DIAGNOSIS — E538 Deficiency of other specified B group vitamins: Secondary | ICD-10-CM

## 2021-12-01 MED ORDER — FLUOXETINE HCL 10 MG PO TABS: 10.0000 mg | ORAL_TABLET | Freq: Every day | ORAL | 3 refills | Status: DC

## 2021-12-01 MED ORDER — CYANOCOBALAMIN 1000 MCG/ML IJ SOLN
1000.0000 ug | Freq: Once | INTRAMUSCULAR | Status: AC
  Administered 2021-12-01: 1000 ug via INTRAMUSCULAR

## 2021-12-01 NOTE — Telephone Encounter (Signed)
Spoke to patient and she is agreeable  to increase of Prozac from '20mg'$  to 30 mg every day

## 2021-12-01 NOTE — Progress Notes (Signed)
Patient presented for B 12 injection to left deltoid, patient voiced no concerns nor showed any signs of distress during injection. 

## 2021-12-06 ENCOUNTER — Other Ambulatory Visit: Payer: Self-pay | Admitting: Internal Medicine

## 2021-12-06 ENCOUNTER — Other Ambulatory Visit (INDEPENDENT_AMBULATORY_CARE_PROVIDER_SITE_OTHER): Payer: 59

## 2021-12-06 DIAGNOSIS — E039 Hypothyroidism, unspecified: Secondary | ICD-10-CM | POA: Diagnosis not present

## 2021-12-07 LAB — TSH: TSH: 28.07 u[IU]/mL — ABNORMAL HIGH (ref 0.35–5.50)

## 2021-12-07 LAB — T4, FREE: Free T4: 0.97 ng/dL (ref 0.60–1.60)

## 2021-12-09 ENCOUNTER — Encounter: Payer: Self-pay | Admitting: Internal Medicine

## 2021-12-09 ENCOUNTER — Ambulatory Visit (INDEPENDENT_AMBULATORY_CARE_PROVIDER_SITE_OTHER): Payer: 59 | Admitting: Internal Medicine

## 2021-12-09 ENCOUNTER — Other Ambulatory Visit: Payer: Self-pay | Admitting: Family

## 2021-12-09 VITALS — BP 100/68 | HR 82 | Ht 60.0 in | Wt 113.8 lb

## 2021-12-09 DIAGNOSIS — E039 Hypothyroidism, unspecified: Secondary | ICD-10-CM | POA: Diagnosis not present

## 2021-12-09 DIAGNOSIS — F900 Attention-deficit hyperactivity disorder, predominantly inattentive type: Secondary | ICD-10-CM

## 2021-12-09 MED ORDER — LEVOTHYROXINE SODIUM 150 MCG PO CAPS: 150.0000 ug | ORAL_CAPSULE | Freq: Every day | ORAL | 1 refills | Status: DC

## 2021-12-09 NOTE — Progress Notes (Signed)
Name: Rachel Alexander  MRN/ DOB: [id number], [birth year]    Age/ Sex: 41 y.o., female     PCP: Allegra Grana, FNP   Reason for Endocrinology Evaluation: Hypothyroidism     Initial Endocrinology Clinic Visit: 10/09/2019    PATIENT IDENTIFIER: Rachel Alexander is a 41 y.o., female with a past medical history of Depression, anxiety and hypothyroidism.  She has followed with [facility] since 10/09/2019 for consultative assistance with management of her hypothyroidism      HISTORICAL SUMMARY:  Pt was diagnosed with hyperthyroidism and goiter at age 16, was put on oral medications ( does not recall ) but over the years  she became hypothyroid requiring LT-4 replacement. She was initially on armour thyroid but was switched synthroid  In 08/2019.     Cousins with thyroid disease    SUBJECTIVE:    Today (12/09/2021):  Rachel Alexander is here for a follow up on hypothyroidism .   Weight has slightly decreased  She is tired  Has constipation but no  diarrhea  She has suffers from depression and anxiety  Has depression  No local neck symptoms   She is on Prilosec . Takes Levo 4 am and PPI at 4 pm  Has headaches   Tirosint 137 mcg  ,1 ap  daily     HISTORY:  Past Medical History:  Past Medical History:  Diagnosis Date   Colitis    Depression    Frequent headaches    Hypothyroidism    Migraines    Pituitary tumor    Suicidal intent    Past Surgical History:  Past Surgical History:  Procedure Laterality Date   BREAST CYST ASPIRATION Right 2012   COLONOSCOPY N/A 08/24/2019   Procedure: COLONOSCOPY;  Surgeon: Wyline Mood, MD;  Location: [facility] ENDOSCOPY;  Service: Gastroenterology;  Laterality: N/A;   TONSILLECTOMY     TUBAL LIGATION     Social History:  reports that she quit smoking about 4 years ago. Her smoking use included cigarettes. She smoked an average of 1 pack per day. She has never used smokeless tobacco. She reports current alcohol use. She  reports that she does not use drugs. Family History:  Family History  Problem Relation Age of Onset   Healthy Mother    Liver cancer Father 63   Diabetes Sister    Breast cancer Maternal Aunt      HOME MEDICATIONS: Allergies as of 12/09/2021   No Known Allergies      Medication List        Accurate as of December 09, 2021 12:08 PM. If you have any questions, ask your nurse or doctor.          ALPRAZolam 1 MG tablet Commonly known as: XANAX Take 1 tablet (1 mg total) by mouth 2 (two) times daily as needed for anxiety.   amphetamine-dextroamphetamine 5 MG tablet Commonly known as: Adderall Take 1 tablet (5 mg total) by mouth 2 (two) times daily.   amphetamine-dextroamphetamine 5 MG tablet Commonly known as: Adderall Take 1 tablet (5 mg total) by mouth 2 (two) times daily.   amphetamine-dextroamphetamine 5 MG tablet Commonly known as: Adderall Take 1 tablet (5 mg total) by mouth 2 (two) times daily.   cholecalciferol 25 MCG (1000 UNIT) tablet Commonly known as: VITAMIN D3 Take 1 tablet (1,000 Units total) by mouth daily.   CRANBERRY PO Take by mouth.   FLUoxetine 20 MG capsule Commonly known as: PROZAC Take 1 capsule (20 mg  total) by mouth every morning.   FLUoxetine 10 MG tablet Commonly known as: PROZAC Take 1 tablet (10 mg total) by mouth daily.   omeprazole 40 MG capsule Commonly known as: PRILOSEC Take 1 capsule (40 mg total) by mouth daily.   sucralfate 1 GM/10ML suspension Commonly known as: Carafate Take 10 mLs (1 g total) by mouth 4 (four) times daily as needed.   SUMAtriptan 25 MG tablet Commonly known as: IMITREX TAKE 1 TAB BY MOUTH DAILY. MAY REPEAT ONCE 2 HRS LATER IF HEADACHE PERSISTS OR RECURS. MAX 2 DOSES.   Tirosint 137 MCG Caps Generic drug: Levothyroxine Sodium Take 1 capsule (137 mcg total) by mouth daily before breakfast.   valACYclovir 500 MG tablet Commonly known as: VALTREX TAKE 500 MG BY MOUTH X EVERY 12 HOURS X 3 DAYS. ASAP  W/IN 24H OF SYMPTOMS ONSET.          OBJECTIVE:   PHYSICAL EXAM: VS: Ht 5' (1.524 m)   Wt 113 lb 12.8 oz (51.6 kg)   BMI 22.23 kg/m    EXAM: General: Pt appears well and is in NAD  Neck: General: Supple without adenopathy. Thyroid: Thyroid size normal.  No goiter or nodules appreciated.  Lungs: Clear with good BS bilat with no rales, rhonchi, or wheezes  Heart: Auscultation: RRR.  Abdomen: Normoactive bowel sounds, soft, nontender, without masses or organomegaly palpable  Extremities:  BL LE: No pretibial edema normal ROM and strength.  Mental Status: Judgment, insight: Intact Orientation: Oriented to time, place, and person Mood and affect: No depression, anxiety, or agitation     DATA REVIEWED:   Latest Reference Range & Units 12/06/21 15:21  TSH 0.35 - 5.50 uIU/mL 28.07 (H)  T4,Free(Direct) 0.60 - 1.60 ng/dL 6.96    ASSESSMENT / PLAN / RECOMMENDATIONS:   Hypothyroidism:    - Pt with multiple symptoms to include depression, anxiety, migraine headaches and fatigue -It has been difficult optimizing her TSH level -We opted to switch her from Synthroid to Tirosint and despite normalization of her free T4, her TSH continues to be above goal -If her free T4 remains to be in the normal range and TSH is not responding we may have to check for assay interference -The cost for Tirosint $70 , I have asked her to look up for coupons online, I am going to send her new prescription to [facility] in [place] to see if that would provide her with better pricing -Patient is asking for thyroid ultrasound, I have ordered this without it would not change the management of LT-for replacement    Medications   Increase Tirosint 150 mcg daily Stop Tirosint 137 mcg    F/U in 4 months  Labs in 6 weeks   Addendum: Discussed labs with pt on 02/14/2021 at 1605 Signed electronically by: Lyndle Herrlich, MD  [facility]  [facility] [address] [place] [place], [place] [address] Phone: [phone] FAX: [phone]      CC: Allegra Grana, FNP [address] [place] [address] Phone: [phone]  Fax: [phone]   Return to Endocrinology clinic as below: Future Appointments  Date Time Provider Department Center  12/09/2021 12:30 PM Sabena Winner, Konrad Dolores, MD [facility] None  01/02/2022  3:15 PM [facility] NURSE [facility]  01/03/2022  3:00 PM Wyline Mood, MD [facility] None  02/15/2022  3:30 PM Arnett, Lyn Records, FNP [facility]

## 2021-12-11 ENCOUNTER — Other Ambulatory Visit: Payer: Self-pay | Admitting: Family

## 2021-12-11 DIAGNOSIS — F900 Attention-deficit hyperactivity disorder, predominantly inattentive type: Secondary | ICD-10-CM

## 2021-12-11 MED ORDER — AMPHETAMINE-DEXTROAMPHETAMINE 5 MG PO TABS: 5.0000 mg | ORAL_TABLET | Freq: Two times a day (BID) | ORAL | 0 refills | Status: DC

## 2021-12-13 ENCOUNTER — Other Ambulatory Visit: Payer: Self-pay | Admitting: Internal Medicine

## 2021-12-13 ENCOUNTER — Other Ambulatory Visit: Payer: Self-pay

## 2021-12-13 MED ORDER — LEVOTHYROXINE SODIUM 150 MCG PO CAPS
150.0000 ug | ORAL_CAPSULE | Freq: Every day | ORAL | 1 refills | Status: DC
Start: 2021-12-13 — End: 2021-12-13

## 2021-12-13 MED ORDER — TIROSINT 150 MCG PO CAPS: 150.0000 ug | ORAL_CAPSULE | Freq: Every day | ORAL | 1 refills | Status: DC

## 2021-12-15 ENCOUNTER — Ambulatory Visit
Admission: RE | Admit: 2021-12-15 | Discharge: 2021-12-15 | Disposition: A | Discharge status: Home / Self Care | Payer: 59 | Source: Ambulatory Visit | Attending: Internal Medicine | Admitting: Internal Medicine

## 2021-12-15 DIAGNOSIS — E039 Hypothyroidism, unspecified: Secondary | ICD-10-CM | POA: Diagnosis not present

## 2021-12-16 ENCOUNTER — Other Ambulatory Visit: Payer: Self-pay | Admitting: Family

## 2021-12-16 DIAGNOSIS — F32A Depression, unspecified: Secondary | ICD-10-CM

## 2021-12-21 ENCOUNTER — Other Ambulatory Visit: Payer: Self-pay | Admitting: Family

## 2021-12-21 DIAGNOSIS — F32A Depression, unspecified: Secondary | ICD-10-CM

## 2022-01-02 ENCOUNTER — Ambulatory Visit (INDEPENDENT_AMBULATORY_CARE_PROVIDER_SITE_OTHER): Payer: 59

## 2022-01-02 DIAGNOSIS — E538 Deficiency of other specified B group vitamins: Secondary | ICD-10-CM | POA: Diagnosis not present

## 2022-01-02 MED ORDER — CYANOCOBALAMIN 1000 MCG/ML IJ SOLN
1000.0000 ug | Freq: Once | INTRAMUSCULAR | Status: AC
  Administered 2022-01-02: 1000 ug via INTRAMUSCULAR

## 2022-01-02 NOTE — Progress Notes (Signed)
Pt arrived for B12 injection, given in R deltoid. Pt tolerated injection well, showed no signs of distress nor voiced any concerns.  

## 2022-01-03 ENCOUNTER — Ambulatory Visit: Payer: 59 | Admitting: Gastroenterology

## 2022-01-07 ENCOUNTER — Other Ambulatory Visit: Payer: Self-pay | Admitting: Family

## 2022-01-07 DIAGNOSIS — F900 Attention-deficit hyperactivity disorder, predominantly inattentive type: Secondary | ICD-10-CM

## 2022-01-09 ENCOUNTER — Other Ambulatory Visit: Payer: Self-pay | Admitting: Family

## 2022-01-09 DIAGNOSIS — F900 Attention-deficit hyperactivity disorder, predominantly inattentive type: Secondary | ICD-10-CM

## 2022-01-09 MED ORDER — AMPHETAMINE-DEXTROAMPHETAMINE 5 MG PO TABS: 5.0000 mg | ORAL_TABLET | Freq: Two times a day (BID) | ORAL | 0 refills | Status: DC

## 2022-01-09 NOTE — Telephone Encounter (Signed)
Possible duplicate 

## 2022-01-18 ENCOUNTER — Other Ambulatory Visit: Payer: Self-pay | Admitting: Family

## 2022-01-18 DIAGNOSIS — R519 Headache, unspecified: Secondary | ICD-10-CM

## 2022-01-19 ENCOUNTER — Other Ambulatory Visit: Payer: Self-pay

## 2022-01-19 DIAGNOSIS — R519 Headache, unspecified: Secondary | ICD-10-CM

## 2022-01-19 MED ORDER — SUMATRIPTAN SUCCINATE 25 MG PO TABS: ORAL_TABLET | ORAL | 2 refills | Status: DC

## 2022-01-19 NOTE — Telephone Encounter (Signed)
Refill sent.

## 2022-01-20 ENCOUNTER — Other Ambulatory Visit: Payer: 59

## 2022-01-20 NOTE — Addendum Note (Signed)
Addended by: Kaylyn Lim I on: 01/20/2022 03:02 PM   Modules accepted: Orders

## 2022-01-23 ENCOUNTER — Encounter: Payer: Self-pay | Admitting: Family

## 2022-01-24 ENCOUNTER — Other Ambulatory Visit: Payer: Self-pay | Admitting: Family

## 2022-01-24 DIAGNOSIS — F32A Depression, unspecified: Secondary | ICD-10-CM

## 2022-01-24 MED ORDER — FLUOXETINE HCL 40 MG PO CAPS: 40.0000 mg | ORAL_CAPSULE | Freq: Every morning | ORAL | 3 refills | Status: DC

## 2022-01-24 NOTE — Progress Notes (Signed)
I looked up patient on [place] Controlled Substances Reporting System PMP AWARE and saw no activity that raised concern of inappropriate use.   

## 2022-02-02 ENCOUNTER — Ambulatory Visit (INDEPENDENT_AMBULATORY_CARE_PROVIDER_SITE_OTHER): Payer: 59

## 2022-02-02 DIAGNOSIS — E538 Deficiency of other specified B group vitamins: Secondary | ICD-10-CM | POA: Diagnosis not present

## 2022-02-02 MED ORDER — CYANOCOBALAMIN 1000 MCG/ML IJ SOLN
1000.0000 ug | Freq: Once | INTRAMUSCULAR | Status: AC
  Administered 2022-02-02: 1000 ug via INTRAMUSCULAR

## 2022-02-02 NOTE — Progress Notes (Signed)
Patient presented for B 12 injection to left deltoid, patient voiced no concerns nor showed any signs of distress during injection. 

## 2022-02-12 ENCOUNTER — Other Ambulatory Visit: Payer: Self-pay | Admitting: Family

## 2022-02-12 DIAGNOSIS — F900 Attention-deficit hyperactivity disorder, predominantly inattentive type: Secondary | ICD-10-CM

## 2022-02-13 ENCOUNTER — Other Ambulatory Visit: Payer: Self-pay | Admitting: Family

## 2022-02-13 DIAGNOSIS — F900 Attention-deficit hyperactivity disorder, predominantly inattentive type: Secondary | ICD-10-CM

## 2022-02-13 MED ORDER — AMPHETAMINE-DEXTROAMPHETAMINE 5 MG PO TABS: 5.0000 mg | ORAL_TABLET | Freq: Two times a day (BID) | ORAL | 0 refills | Status: DC

## 2022-02-13 NOTE — Progress Notes (Signed)
I looked up patient on [place] Controlled Substances Reporting System PMP AWARE and saw no activity that raised concern of inappropriate use.   

## 2022-02-15 ENCOUNTER — Ambulatory Visit (INDEPENDENT_AMBULATORY_CARE_PROVIDER_SITE_OTHER): Payer: Self-pay

## 2022-02-15 ENCOUNTER — Encounter: Payer: Self-pay | Admitting: Family

## 2022-02-15 ENCOUNTER — Ambulatory Visit (INDEPENDENT_AMBULATORY_CARE_PROVIDER_SITE_OTHER): Payer: Self-pay | Admitting: Family

## 2022-02-15 VITALS — BP 112/64 | HR 79 | Temp 98.4°F | Ht 62.0 in | Wt 106.4 lb

## 2022-02-15 DIAGNOSIS — E538 Deficiency of other specified B group vitamins: Secondary | ICD-10-CM

## 2022-02-15 DIAGNOSIS — F419 Anxiety disorder, unspecified: Secondary | ICD-10-CM

## 2022-02-15 DIAGNOSIS — F32A Depression, unspecified: Secondary | ICD-10-CM

## 2022-02-15 DIAGNOSIS — R61 Generalized hyperhidrosis: Secondary | ICD-10-CM

## 2022-02-15 DIAGNOSIS — F900 Attention-deficit hyperactivity disorder, predominantly inattentive type: Secondary | ICD-10-CM

## 2022-02-15 MED ORDER — FLUOXETINE HCL 20 MG PO CAPS: 20.0000 mg | ORAL_CAPSULE | Freq: Every day | ORAL | 3 refills | Status: DC

## 2022-02-15 NOTE — Progress Notes (Signed)
Patient Hurt her back on Monday at work

## 2022-02-15 NOTE — Progress Notes (Signed)
Subjective:    Patient ID: Rachel Alexander, female    DOB: Jun 27, 1980, 41 y.o.   MRN: [id number]  CC: Rachel Alexander is a 41 y.o. female who presents today for follow up.   HPI: She is sweating around mouth in the mornings when she is putting on makeup.  She is not drenching sheets . No fever, cough, cp, unusual bone pain  Adderall has not effected weight.  She is compliant with Adderall 5 mg twice daily which has been helpful for concentration.  Increased Prozac to 40 mg 3 weeks ago and she feels medication has been helpful for anxiety and depression.  She is interested in increasing medication.  Epigastric pain resolved. She is no longer takng omeprazole 40 mg  Hypothyroidism-recently changed to tirosint by Rachel Alexander HISTORY:  Past Medical History:  Diagnosis Date  . Colitis   . Depression   . Frequent headaches   . Hypothyroidism   . Migraines   . Pituitary tumor   . Suicidal intent    Past Surgical History:  Procedure Laterality Date  . BREAST CYST ASPIRATION Right 2012  . COLONOSCOPY N/A 08/24/2019   Procedure: COLONOSCOPY;  Surgeon: Jonathon Bellows, MD;  Location: [facility] ENDOSCOPY;  Service: Gastroenterology;  Laterality: N/A;  . TONSILLECTOMY    . TUBAL LIGATION     Family History  Problem Relation Age of Onset  . Healthy Mother   . Liver cancer Father 76  . Diabetes Sister   . Breast cancer Maternal Aunt     Allergies: Patient has no known allergies. Current Outpatient Medications on File Prior to Visit  Medication Sig Dispense Refill  . ALPRAZolam (XANAX) 1 MG tablet TAKE 1 TABLET BY MOUTH 2 TIMES DAILY AS NEEDED FOR ANXIETY. 60 tablet 2  . amphetamine-dextroamphetamine (ADDERALL) 5 MG tablet Take 1 tablet (5 mg total) by mouth 2 (two) times daily. 60 tablet 0  . FLUoxetine (PROZAC) 40 MG capsule Take 1 capsule (40 mg total) by mouth every morning. 90 capsule 3  . SUMAtriptan (IMITREX) 25 MG tablet TAKE 1 TAB BY MOUTH DAILY. MAY REPEAT ONCE 2 HRS LATER IF  HEADACHE PERSISTS OR RECURS. MAX 2 DOSES. 9 tablet 2  . SUMAtriptan (IMITREX) 25 MG tablet TAKE 1 TAB BY MOUTH DAILY. MAY REPEAT ONCE 2 HRS LATER IF HEADACHE PERSISTS OR RECURS. MAX 2 DOSES. 9 tablet 2  . TIROSINT 150 MCG CAPS Take 1 capsule (150 mcg total) by mouth daily before breakfast. 90 capsule 1  . valACYclovir (VALTREX) 500 MG tablet TAKE 500 MG BY MOUTH X EVERY 12 HOURS X 3 DAYS. ASAP W/IN 24H OF SYMPTOMS ONSET. 18 tablet 2  . amphetamine-dextroamphetamine (ADDERALL) 5 MG tablet Take 1 tablet (5 mg total) by mouth 2 (two) times daily. (Patient not taking: Reported on 02/15/2022) 60 tablet 0  . CRANBERRY PO Take by mouth.    Marland Kitchen omeprazole (PRILOSEC) 40 MG capsule Take 1 capsule (40 mg total) by mouth daily. 30 capsule 0  . [DISCONTINUED] dicyclomine (BENTYL) 20 MG tablet Take 1 tablet (20 mg total) by mouth 2 (two) times daily. 20 tablet 0   No current facility-administered medications on file prior to visit.    Social History   Tobacco Use  . Smoking status: Former    Packs/day: 1.00    Types: Cigarettes    Quit date: 10/06/2017    Years since quitting: 4.3  . Smokeless tobacco: Never  Vaping Use  . Vaping Use: Every day  Substance Use Topics  .  Alcohol use: Yes    Comment: occ  . Drug use: No    Review of Systems  Constitutional:  Positive for diaphoresis. Negative for chills and fever.  Respiratory:  Negative for cough.   Cardiovascular:  Negative for chest pain and palpitations.  Gastrointestinal:  Negative for nausea and vomiting.      Objective:    BP 112/64 (BP Location: Left Arm, Patient Position: Sitting, Cuff Size: Normal)   Pulse 79   Temp 98.4 F (36.9 C) (Oral)   Ht '5\' 2"'$  (1.575 m)   Wt 106 lb 6.4 oz (48.3 kg)   LMP 01/25/2022 (Approximate)   SpO2 97%   BMI 19.46 kg/m  BP Readings from Last 3 Encounters:  02/15/22 112/64  12/09/21 100/68  11/13/21 94/69   Wt Readings from Last 3 Encounters:  02/15/22 106 lb 6.4 oz (48.3 kg)  12/09/21 113 lb  12.8 oz (51.6 kg)  11/13/21 115 lb (52.2 kg)    Physical Exam Vitals reviewed.  Constitutional:      Appearance: She is well-developed.  Eyes:     Conjunctiva/sclera: Conjunctivae normal.  Neck:     Thyroid: No thyroid mass or thyromegaly.  Cardiovascular:     Rate and Rhythm: Normal rate and regular rhythm.     Pulses: Normal pulses.     Heart sounds: Normal heart sounds.  Pulmonary:     Effort: Pulmonary effort is normal.     Breath sounds: Normal breath sounds. No wheezing, rhonchi or rales.  Lymphadenopathy:     Head:     Right side of head: No submental, submandibular, tonsillar, preauricular, posterior auricular or occipital adenopathy.     Left side of head: No submental, submandibular, tonsillar, preauricular, posterior auricular or occipital adenopathy.     Cervical: No cervical adenopathy.     Upper Body:     Right upper body: No axillary adenopathy.     Left upper body: No axillary adenopathy.  Skin:    General: Skin is warm and dry.  Neurological:     Mental Status: She is alert.  Psychiatric:        Speech: Speech normal.        Behavior: Behavior normal.        Thought Content: Thought content normal.       Assessment & Plan:   Problem List Items Addressed This Visit       Other   Anxiety and depression    Chronic, improved.  We jointly agreed to increase Prozac to 60 mg.  She will let me know how she is doing      Relevant Medications   FLUoxetine (PROZAC) 20 MG capsule   Attention deficit hyperactivity disorder, predominantly inattentive type    Chronic, stable.  Patient has lost 7 pounds since previous visit.  Discussed this extensively in the setting of Adderall 5 mg twice daily.  She does not feel that appetite is affected by Adderall.  I question if depression or anxiety is affecting appetite.  We jointly agreed to increase Prozac.  We could consider Remeron but certainly if weight continues to decrease.  Continue Adderall 5 mg twice daily for  now.      B12 deficiency - Primary   Relevant Orders   B12 and Folate Panel   IBC + Ferritin   Diaphoresis    Etiology nonspecific at this time.  No B symptoms at this time and I do agree with patient that I do question if thyroid is  playing a role though with the TSH being high I would anticipate more cold intolerance versus diaphoresis.  Chest x-ray is reassuring.  Previously ordered CT chest abdomen and pelvis for malignancy work-up in the setting of weight loss and diaphoresis.  Will discuss pursuing further testing with patient again.  Alternatively, consider hematology/oncology consult.         I have discontinued Maisie Lowdermilk "[name]"'s cholecalciferol. I am also having her start on FLUoxetine. Additionally, I am having her maintain her CRANBERRY PO, valACYclovir, omeprazole, Tirosint, ALPRAZolam, SUMAtriptan, SUMAtriptan, FLUoxetine, amphetamine-dextroamphetamine, and amphetamine-dextroamphetamine.   Meds ordered this encounter  Medications  . FLUoxetine (PROZAC) 20 MG capsule    Sig: Take 1 capsule (20 mg total) by mouth daily.    Dispense:  90 capsule    Refill:  3    Take in addition '40mg'$ , total '60mg'$ .    Order Specific Question:   Supervising Provider    Answer:   Crecencio Mc [[id number]]    Return precautions given.   Risks, benefits, and alternatives of the medications and treatment plan prescribed today were discussed, and patient expressed understanding.   Education regarding symptom management and diagnosis given to patient on AVS.  Continue to follow with Burnard Hawthorne, FNP for routine health maintenance.   Rachel Alexander and I agreed with plan.   Mable Paris, FNP

## 2022-02-16 LAB — COMPREHENSIVE METABOLIC PANEL
ALT: 16 U/L (ref 0–35)
AST: 18 U/L (ref 0–37)
Albumin: 4.4 g/dL (ref 3.5–5.2)
Alkaline Phosphatase: 33 U/L — ABNORMAL LOW (ref 39–117)
BUN: 10 mg/dL (ref 6–23)
CO2: 31 mEq/L (ref 19–32)
Calcium: 8.5 mg/dL (ref 8.4–10.5)
Chloride: 101 mEq/L (ref 96–112)
Creatinine, Ser: 0.79 mg/dL (ref 0.40–1.20)
GFR: 92.22 mL/min (ref >60.00)
Glucose, Bld: 70 mg/dL (ref 70–99)
Potassium: 3.8 mEq/L (ref 3.5–5.1)
Sodium: 140 mEq/L (ref 135–145)
Total Bilirubin: 0.4 mg/dL (ref 0.2–1.2)
Total Protein: 6.5 g/dL (ref 6.0–8.3)

## 2022-02-16 LAB — TSH: TSH: 20.87 u[IU]/mL — ABNORMAL HIGH (ref 0.35–5.50)

## 2022-02-16 LAB — CBC WITH DIFFERENTIAL/PLATELET
Basophils Absolute: 0.1 10*3/uL (ref 0.0–0.1)
Basophils Relative: 1.9 % (ref 0.0–3.0)
Eosinophils Absolute: 0 10*3/uL (ref 0.0–0.7)
Eosinophils Relative: 0.1 % (ref 0.0–5.0)
HCT: 35.3 % — ABNORMAL LOW (ref 36.0–46.0)
Hemoglobin: 11.8 g/dL — ABNORMAL LOW (ref 12.0–15.0)
Lymphocytes Relative: 45.7 % (ref 12.0–46.0)
Lymphs Abs: 2.1 10*3/uL (ref 0.7–4.0)
MCHC: 33.5 g/dL (ref 30.0–36.0)
MCV: 101.5 fl — ABNORMAL HIGH (ref 78.0–100.0)
Monocytes Absolute: 0.4 10*3/uL (ref 0.1–1.0)
Monocytes Relative: 8.8 % (ref 3.0–12.0)
Neutro Abs: 2 10*3/uL (ref 1.4–7.7)
Neutrophils Relative %: 43.5 % (ref 43.0–77.0)
Platelets: 190 10*3/uL (ref 150.0–400.0)
RBC: 3.48 Mil/uL — ABNORMAL LOW (ref 3.87–5.11)
RDW: 12.5 % (ref 11.5–15.5)
WBC: 4.6 10*3/uL (ref 4.0–10.5)

## 2022-02-21 NOTE — Assessment & Plan Note (Signed)
Chronic, stable.  Patient has lost 7 pounds since previous visit.  Discussed this extensively in the setting of Adderall 5 mg twice daily.  She does not feel that appetite is affected by Adderall.  I question if depression or anxiety is affecting appetite.  We jointly agreed to increase Prozac.  We could consider Remeron but certainly if weight continues to decrease.  Continue Adderall 5 mg twice daily for now.

## 2022-02-21 NOTE — Assessment & Plan Note (Signed)
Etiology nonspecific at this time.  No B symptoms at this time and I do agree with patient that I do question if thyroid is playing a role though with the TSH being high I would anticipate more cold intolerance versus diaphoresis.  Chest x-ray is reassuring.  Previously ordered CT chest abdomen and pelvis for malignancy work-up in the setting of weight loss and diaphoresis.  Will discuss pursuing further testing with patient again.  Alternatively, consider hematology/oncology consult.

## 2022-02-21 NOTE — Assessment & Plan Note (Signed)
Chronic, improved.  We jointly agreed to increase Prozac to 60 mg.  She will let me know how she is doing

## 2022-03-06 ENCOUNTER — Ambulatory Visit: Payer: 59

## 2022-03-17 ENCOUNTER — Other Ambulatory Visit: Payer: Self-pay | Admitting: Family

## 2022-03-17 DIAGNOSIS — F419 Anxiety disorder, unspecified: Secondary | ICD-10-CM

## 2022-03-20 ENCOUNTER — Other Ambulatory Visit: Payer: Self-pay | Admitting: Internal Medicine

## 2022-03-20 ENCOUNTER — Other Ambulatory Visit: Payer: Self-pay | Admitting: Family

## 2022-03-20 DIAGNOSIS — F900 Attention-deficit hyperactivity disorder, predominantly inattentive type: Secondary | ICD-10-CM

## 2022-03-21 ENCOUNTER — Other Ambulatory Visit: Payer: Self-pay | Admitting: Family

## 2022-03-21 ENCOUNTER — Encounter: Payer: Self-pay | Admitting: Family

## 2022-03-21 DIAGNOSIS — F900 Attention-deficit hyperactivity disorder, predominantly inattentive type: Secondary | ICD-10-CM

## 2022-03-21 NOTE — Progress Notes (Signed)
close

## 2022-03-22 ENCOUNTER — Other Ambulatory Visit: Payer: Self-pay

## 2022-03-22 ENCOUNTER — Telehealth: Payer: Self-pay | Admitting: Family

## 2022-03-22 DIAGNOSIS — F419 Anxiety disorder, unspecified: Secondary | ICD-10-CM

## 2022-03-22 MED ORDER — ALPRAZOLAM 1 MG PO TABS: 1.0000 mg | ORAL_TABLET | Freq: Two times a day (BID) | ORAL | 2 refills | Status: DC | PRN

## 2022-03-22 NOTE — Telephone Encounter (Signed)
Call patient I refilled xanax to [facility] at [place]  I looked up patient on [place] Controlled Substances Reporting System PMP AWARE and saw no activity that raised concern of inappropriate use.

## 2022-03-22 NOTE — Telephone Encounter (Signed)
Patient is requesting a refill on her ALPRAZolam (XANAX) 1 MG tablet. Please fill asap, per patient, she is out. Patient was advised that office has 2 to 3 business days to refill.

## 2022-03-23 ENCOUNTER — Other Ambulatory Visit: Payer: Self-pay

## 2022-03-23 DIAGNOSIS — E538 Deficiency of other specified B group vitamins: Secondary | ICD-10-CM

## 2022-03-23 NOTE — Telephone Encounter (Signed)
Spoke to patient and informed her that she could pick up her medication. Pt stated that she needed a recheck of her b 12 per Joycelyn Schmid, so I scheduled her a lab appt for 03/24/22

## 2022-03-23 NOTE — Progress Notes (Signed)
Orders

## 2022-03-24 ENCOUNTER — Other Ambulatory Visit: Payer: Self-pay

## 2022-03-24 ENCOUNTER — Other Ambulatory Visit: Payer: Self-pay | Admitting: *Deleted

## 2022-03-24 DIAGNOSIS — E538 Deficiency of other specified B group vitamins: Secondary | ICD-10-CM

## 2022-03-24 NOTE — Addendum Note (Signed)
Addended by: Leeanne Rio on: 03/24/2022 12:35 PM   Modules accepted: Orders

## 2022-03-31 ENCOUNTER — Encounter: Payer: Self-pay | Admitting: Family Medicine

## 2022-03-31 ENCOUNTER — Ambulatory Visit: Payer: BC Managed Care – PPO | Admitting: Family Medicine

## 2022-03-31 VITALS — BP 90/60 | HR 61 | Temp 99.1°F | Ht 62.0 in | Wt 104.2 lb

## 2022-03-31 DIAGNOSIS — R350 Frequency of micturition: Secondary | ICD-10-CM | POA: Diagnosis not present

## 2022-03-31 DIAGNOSIS — R809 Proteinuria, unspecified: Secondary | ICD-10-CM | POA: Diagnosis not present

## 2022-03-31 DIAGNOSIS — E538 Deficiency of other specified B group vitamins: Secondary | ICD-10-CM

## 2022-03-31 DIAGNOSIS — R1013 Epigastric pain: Secondary | ICD-10-CM | POA: Diagnosis not present

## 2022-03-31 LAB — POCT URINALYSIS DIPSTICK
Bilirubin, UA: NEGATIVE
Blood, UA: NEGATIVE
Glucose, UA: NEGATIVE
Ketones, UA: NEGATIVE
Leukocytes, UA: NEGATIVE
Nitrite, UA: NEGATIVE
Protein, UA: POSITIVE — AB
Spec Grav, UA: 1.01 (ref 1.010–1.025)
Urobilinogen, UA: 0.2 E.U./dL
pH, UA: 8.5 — AB (ref 5.0–8.0)

## 2022-03-31 MED ORDER — PANTOPRAZOLE SODIUM 40 MG PO TBEC: 40.0000 mg | DELAYED_RELEASE_TABLET | Freq: Every day | ORAL | 3 refills | Status: DC

## 2022-03-31 NOTE — Progress Notes (Signed)
Rachel Rumps, MD Phone: [phone]  Kiyoko Mcguirt is a 41 y.o. female who presents today for same day visit.   Urinary frequency: Patient notes onset about a week ago.  She had urinary frequency and urgency with some mild lower abdominal discomfort and some lower back discomfort.  She has minimal vaginal discharge that she at times has to wear a pad for her.  She notes she has not been sexually active in a year.  Epigastric pain: Patient notes this has been going on for some time now.  She has epigastric discomfort when she eats.  She has no vomiting or diarrhea.  No blood in her stool.  She poops 2 times a week.  LMP was 03/10/2022.  Notes she went to the emergency department for this previously and they advised that they thought it might be an ulcer.  Social History   Tobacco Use  Smoking Status Former   Packs/day: 1.00   Types: Cigarettes   Quit date: 10/06/2017   Years since quitting: 4.4  Smokeless Tobacco Never    Current Outpatient Medications on File Prior to Visit  Medication Sig Dispense Refill   ALPRAZolam (XANAX) 1 MG tablet Take 1 tablet (1 mg total) by mouth 2 (two) times daily as needed for anxiety. 60 tablet 2   amphetamine-dextroamphetamine (ADDERALL) 5 MG tablet Take 1 tablet (5 mg total) by mouth 2 (two) times daily. 60 tablet 0   amphetamine-dextroamphetamine (ADDERALL) 5 MG tablet Take 1 tablet (5 mg total) by mouth 2 (two) times daily. 60 tablet 0   CRANBERRY PO Take by mouth.     FLUoxetine (PROZAC) 20 MG capsule Take 1 capsule (20 mg total) by mouth daily. 90 capsule 3   FLUoxetine (PROZAC) 40 MG capsule TAKE 1 CAPSULE (40 MG TOTAL) BY MOUTH EVERY MORNING. 90 capsule 4   SUMAtriptan (IMITREX) 25 MG tablet TAKE 1 TAB BY MOUTH DAILY. MAY REPEAT ONCE 2 HRS LATER IF HEADACHE PERSISTS OR RECURS. MAX 2 DOSES. 9 tablet 2   SUMAtriptan (IMITREX) 25 MG tablet TAKE 1 TAB BY MOUTH DAILY. MAY REPEAT ONCE 2 HRS LATER IF HEADACHE PERSISTS OR RECURS. MAX 2 DOSES. 9 tablet  2   TIROSINT 150 MCG CAPS TAKE ONE Capsule BY MOUTH EVERY DAY BEFORE BREAKFAST 90 capsule 1   valACYclovir (VALTREX) 500 MG tablet TAKE 500 MG BY MOUTH X EVERY 12 HOURS X 3 DAYS. ASAP W/IN 24H OF SYMPTOMS ONSET. 18 tablet 2   [DISCONTINUED] dicyclomine (BENTYL) 20 MG tablet Take 1 tablet (20 mg total) by mouth 2 (two) times daily. 20 tablet 0   No current facility-administered medications on file prior to visit.     ROS see history of present illness  Objective  Physical Exam Vitals:   03/31/22 1531  BP: 90/60  Pulse: 61  Temp: 99.1 F (37.3 C)  SpO2: 99%    BP Readings from Last 3 Encounters:  03/31/22 90/60  02/15/22 112/64  12/09/21 100/68   Wt Readings from Last 3 Encounters:  03/31/22 104 lb 3.2 oz (47.3 kg)  02/15/22 106 lb 6.4 oz (48.3 kg)  12/09/21 113 lb 12.8 oz (51.6 kg)    Physical Exam Constitutional:      General: She is not in acute distress.    Appearance: She is not diaphoretic.  Pulmonary:     Effort: Pulmonary effort is normal.  Abdominal:     General: Bowel sounds are normal. There is no distension.     Palpations: Abdomen is soft.  Tenderness: There is abdominal tenderness (Epigastric). There is no guarding or rebound.  Neurological:     Mental Status: She is alert.      Assessment/Plan: Please see individual problem list.  Problem List Items Addressed This Visit     Epigastric pain (Chronic)    This is a chronic ongoing issue.  Possibly related to a stomach ulcer.  She had a CT scan previously that did not reveal a cause.  She has not been evaluated by GI for this and thus I will refer to GI.  We will start her on Protonix 40 mg once daily 30 to 60 minutes before breakfast.      Relevant Medications   pantoprazole (PROTONIX) 40 MG tablet   Other Relevant Orders   Ambulatory referral to Gastroenterology   B12 deficiency   Urine frequency - Primary    Recent onset.  Urinalysis is not overly concerning for UTI.  Discussed the  potential for doing a pelvic exam with vaginal swabs for yeast and BV though she opted against doing this.  Discussed we could send her urine for culture just to confirm if there was a UTI and that we will contact her with those results if they were positive over the weekend.  Discussed she could try Monistat over-the-counter to see if that would help with the vaginal discharge.  Discussed if her urine culture was negative and the Monistat was not beneficial she would need to return to have a swab completed.      Relevant Orders   POCT Urinalysis Dipstick (Completed)   Urine Culture   Other Visit Diagnoses     Proteinuria, unspecified type       Relevant Orders   Protein / creatinine ratio, urine        Return if symptoms worsen or fail to improve.   Rachel Rumps, MD [place]

## 2022-03-31 NOTE — Progress Notes (Signed)
cute

## 2022-03-31 NOTE — Assessment & Plan Note (Signed)
Recent onset.  Urinalysis is not overly concerning for UTI.  Discussed the potential for doing a pelvic exam with vaginal swabs for yeast and BV though she opted against doing this.  Discussed we could send her urine for culture just to confirm if there was a UTI and that we will contact her with those results if they were positive over the weekend.  Discussed she could try Monistat over-the-counter to see if that would help with the vaginal discharge.  Discussed if her urine culture was negative and the Monistat was not beneficial she would need to return to have a swab completed.

## 2022-03-31 NOTE — Assessment & Plan Note (Signed)
This is a chronic ongoing issue.  Possibly related to a stomach ulcer.  She had a CT scan previously that did not reveal a cause.  She has not been evaluated by GI for this and thus I will refer to GI.  We will start her on Protonix 40 mg once daily 30 to 60 minutes before breakfast.

## 2022-03-31 NOTE — Patient Instructions (Signed)
Nice to see you. I sent Protonix in for you to take to see if that helps with your stomach pain.  I did place a referral to GI for you. We will contact you with your urine culture result.  You can try Monistat over-the-counter and see if that helps with your discharge.

## 2022-04-01 LAB — B12 AND FOLATE PANEL
Folate: 6.1 ng/mL (ref >3.0)
Vitamin B-12: 637 pg/mL (ref 232–1245)

## 2022-04-01 LAB — URINE CULTURE
MICRO NUMBER:: 14048437
Result:: NO GROWTH
SPECIMEN QUALITY:: ADEQUATE

## 2022-04-01 LAB — PROTEIN / CREATININE RATIO, URINE
Creatinine, Urine: 86 mg/dL (ref 20–275)
Protein/Creat Ratio: 116 mg/g creat (ref 24–184)
Protein/Creatinine Ratio: 0.116 mg/mg creat (ref 0.024–0.184)
Total Protein, Urine: 10 mg/dL (ref 5–24)

## 2022-04-01 LAB — IRON,TIBC AND FERRITIN PANEL
Ferritin: 37 ng/mL (ref 15–150)
Iron Saturation: 25 % (ref 15–55)
Iron: 78 ug/dL (ref 27–159)
Total Iron Binding Capacity: 306 ug/dL (ref 250–450)
UIBC: 228 ug/dL (ref 131–425)

## 2022-04-03 ENCOUNTER — Other Ambulatory Visit: Payer: Self-pay | Admitting: Family

## 2022-04-03 DIAGNOSIS — D649 Anemia, unspecified: Secondary | ICD-10-CM

## 2022-05-06 ENCOUNTER — Other Ambulatory Visit: Payer: Self-pay | Admitting: Family

## 2022-05-06 DIAGNOSIS — F900 Attention-deficit hyperactivity disorder, predominantly inattentive type: Secondary | ICD-10-CM

## 2022-05-08 ENCOUNTER — Other Ambulatory Visit: Payer: Self-pay | Admitting: Family

## 2022-05-08 DIAGNOSIS — F900 Attention-deficit hyperactivity disorder, predominantly inattentive type: Secondary | ICD-10-CM

## 2022-05-08 MED ORDER — AMPHETAMINE-DEXTROAMPHETAMINE 5 MG PO TABS: 5.0000 mg | ORAL_TABLET | Freq: Two times a day (BID) | ORAL | 0 refills | Status: DC

## 2022-05-08 NOTE — Progress Notes (Signed)
I looked up patient on [place] Controlled Substances Reporting System PMP AWARE and saw no activity that raised concern of inappropriate use.   

## 2022-05-09 ENCOUNTER — Encounter: Payer: Self-pay | Admitting: Internal Medicine

## 2022-05-09 ENCOUNTER — Ambulatory Visit: Payer: BC Managed Care – PPO | Admitting: Internal Medicine

## 2022-05-09 VITALS — BP 104/68 | HR 71 | Ht 62.0 in | Wt 106.0 lb

## 2022-05-09 DIAGNOSIS — E039 Hypothyroidism, unspecified: Secondary | ICD-10-CM | POA: Diagnosis not present

## 2022-05-09 NOTE — Patient Instructions (Signed)

## 2022-05-09 NOTE — Progress Notes (Signed)
Name: Rachel Alexander  MRN/ DOB: [id number], 1980/07/12    Age/ Sex: 41 y.o., female     PCP: Burnard Hawthorne, FNP   Reason for Endocrinology Evaluation: Hypothyroidism     Initial Endocrinology Clinic Visit: 10/09/2019    PATIENT IDENTIFIER: Rachel Alexander is a 41 y.o., female with a past medical history of Depression, anxiety and hypothyroidism.  She has followed with [facility] since 10/09/2019 for consultative assistance with management of her hypothyroidism      HISTORICAL SUMMARY:  Pt was diagnosed with hyperthyroidism and goiter at age 75, was put on oral medications ( does not recall ) but over the years  she became hypothyroid requiring LT-4 replacement. She was initially on armour thyroid but was switched synthroid  In 08/2019.     Cousins with thyroid disease    SUBJECTIVE:    Today (05/09/2022):  [name] is here for a follow up on hypothyroidism .   Weight has been stable  Denies constipation no  diarrhea  She continues with moodiness and fatigue  Has noted right neck soreness last night  She stopped Prilosec  She stopped Collagen     Tirosint 150 mcg  ,1 tab daily       HISTORY:  Past Medical History:  Past Medical History:  Diagnosis Date   Colitis    Depression    Frequent headaches    Hypothyroidism    Migraines    Pituitary tumor    Suicidal intent    Past Surgical History:  Past Surgical History:  Procedure Laterality Date   BREAST CYST ASPIRATION Right 2012   COLONOSCOPY N/A 08/24/2019   Procedure: COLONOSCOPY;  Surgeon: Jonathon Bellows, MD;  Location: [facility] ENDOSCOPY;  Service: Gastroenterology;  Laterality: N/A;   TONSILLECTOMY     TUBAL LIGATION     Social History:  reports that she quit smoking about 4 years ago. Her smoking use included cigarettes. She smoked an average of 1 pack per day. She has never used smokeless tobacco. She reports current alcohol use. She reports that she does not use drugs. Family  History:  Family History  Problem Relation Age of Onset   Healthy Mother    Liver cancer Father 44   Diabetes Sister    Breast cancer Maternal Aunt      HOME MEDICATIONS: Allergies as of 05/09/2022   No Known Allergies      Medication List        Accurate as of May 09, 2022 12:30 PM. If you have any questions, ask your nurse or doctor.          ALPRAZolam 1 MG tablet Commonly known as: XANAX Take 1 tablet (1 mg total) by mouth 2 (two) times daily as needed for anxiety.   amphetamine-dextroamphetamine 5 MG tablet Commonly known as: Adderall Take 1 tablet (5 mg total) by mouth 2 (two) times daily.   amphetamine-dextroamphetamine 5 MG tablet Commonly known as: Adderall Take 1 tablet (5 mg total) by mouth 2 (two) times daily.   amphetamine-dextroamphetamine 5 MG tablet Commonly known as: Adderall Take 1 tablet (5 mg total) by mouth 2 (two) times daily.   CRANBERRY PO Take by mouth.   FLUoxetine 20 MG capsule Commonly known as: PROzac Take 1 capsule (20 mg total) by mouth daily.   FLUoxetine 40 MG capsule Commonly known as: PROZAC TAKE 1 CAPSULE (40 MG TOTAL) BY MOUTH EVERY MORNING.   pantoprazole 40 MG tablet Commonly known as: PROTONIX Take 1  tablet (40 mg total) by mouth daily.   SUMAtriptan 25 MG tablet Commonly known as: IMITREX TAKE 1 TAB BY MOUTH DAILY. MAY REPEAT ONCE 2 HRS LATER IF HEADACHE PERSISTS OR RECURS. MAX 2 DOSES.   SUMAtriptan 25 MG tablet Commonly known as: IMITREX TAKE 1 TAB BY MOUTH DAILY. MAY REPEAT ONCE 2 HRS LATER IF HEADACHE PERSISTS OR RECURS. MAX 2 DOSES.   Tirosint 150 MCG Caps Generic drug: Levothyroxine Sodium TAKE ONE Capsule BY MOUTH EVERY DAY BEFORE BREAKFAST   valACYclovir 500 MG tablet Commonly known as: VALTREX TAKE 500 MG BY MOUTH X EVERY 12 HOURS X 3 DAYS. ASAP W/IN 24H OF SYMPTOMS ONSET.          OBJECTIVE:   PHYSICAL EXAM: VS: BP 104/68 (BP Location: Left Arm, Patient Position: Sitting, Cuff  Size: Small)   Pulse 71   Ht '5\' 2"'$  (1.575 m)   Wt 106 lb (48.1 kg)   SpO2 98%   BMI 19.39 kg/m   EXAM: General: Pt appears well and is in NAD  Neck: General: Supple without adenopathy. Thyroid: Thyroid size normal.  No goiter or nodules appreciated.  Lungs: Clear with good BS bilat with no rales, rhonchi, or wheezes  Heart: Auscultation: RRR.  Abdomen: Normoactive bowel sounds, soft, nontender, without masses or organomegaly palpable  Extremities:  BL LE: No pretibial edema normal ROM and strength.  Mental Status: Judgment, insight: Intact Orientation: Oriented to time, place, and person Mood and affect: No depression, anxiety, or agitation     DATA REVIEWED:  Latest Reference Range & Units 05/15/22 15:49  TSH 0.35 - 5.50 uIU/mL 13.39 (H)  (H): Data is abnormally high   ASSESSMENT / PLAN / RECOMMENDATIONS:   Hypothyroidism:    -It has been difficult optimizing her TSH level, she assures me compliance and I suspect she inability to absorb LT-4  -We even switch from  Synthroid to Tirosint but TSH remains above goal  - Will start Liothyronine as below    Medications   Continue  Tirosint 150 mcg daily Start Liothyronine 5 mcg daily     F/U in 4 months  Labs in 6 weeks   Addendum: Discussed labs with pt on 02/14/2021 at [place] Signed electronically by: Mack Guise, MD  [facility]  [facility] [address] [place], [place] [address] Phone: [phone] FAX: [phone]      CC: Burnard Hawthorne, FNP [address] [place] [address] Phone: [phone]  Fax: [phone]   Return to Endocrinology clinic as below: Future Appointments  Date Time Provider [place]  05/09/2022  2:40 PM Jermey Closs, Melanie Crazier, MD [facility] None  05/19/2022  3:30 PM Burnard Hawthorne, FNP [facility]  05/23/2022  2:15 PM Jonathon Bellows, MD [facility] None

## 2022-05-10 DIAGNOSIS — E039 Hypothyroidism, unspecified: Secondary | ICD-10-CM | POA: Diagnosis not present

## 2022-05-15 ENCOUNTER — Other Ambulatory Visit: Payer: Self-pay

## 2022-05-15 ENCOUNTER — Encounter: Payer: Self-pay | Admitting: Internal Medicine

## 2022-05-15 ENCOUNTER — Other Ambulatory Visit (INDEPENDENT_AMBULATORY_CARE_PROVIDER_SITE_OTHER): Payer: BC Managed Care – PPO

## 2022-05-15 DIAGNOSIS — E039 Hypothyroidism, unspecified: Secondary | ICD-10-CM | POA: Diagnosis not present

## 2022-05-15 LAB — TSH: TSH: 13.39 u[IU]/mL — ABNORMAL HIGH (ref 0.35–5.50)

## 2022-05-16 ENCOUNTER — Encounter: Payer: Self-pay | Admitting: Family

## 2022-05-16 ENCOUNTER — Other Ambulatory Visit: Payer: Self-pay

## 2022-05-16 MED ORDER — TIROSINT 150 MCG PO CAPS: 150.0000 ug | ORAL_CAPSULE | Freq: Every day | ORAL | 3 refills | Status: DC

## 2022-05-16 MED ORDER — LIOTHYRONINE SODIUM 5 MCG PO TABS: 5.0000 ug | ORAL_TABLET | Freq: Every day | ORAL | 3 refills | Status: DC

## 2022-05-17 ENCOUNTER — Other Ambulatory Visit: Payer: Self-pay

## 2022-05-17 ENCOUNTER — Other Ambulatory Visit: Payer: Self-pay | Admitting: Family

## 2022-05-17 DIAGNOSIS — F419 Anxiety disorder, unspecified: Secondary | ICD-10-CM

## 2022-05-17 LAB — T4, FREE: Free T4: 1.2 ng/dL (ref 0.8–1.8)

## 2022-05-17 NOTE — Progress Notes (Signed)
close

## 2022-05-18 ENCOUNTER — Other Ambulatory Visit: Payer: Self-pay

## 2022-05-19 ENCOUNTER — Ambulatory Visit: Payer: BC Managed Care – PPO | Admitting: Family

## 2022-05-22 ENCOUNTER — Emergency Department: Payer: BC Managed Care – PPO

## 2022-05-22 ENCOUNTER — Telehealth: Payer: Self-pay

## 2022-05-22 ENCOUNTER — Other Ambulatory Visit: Payer: Self-pay

## 2022-05-22 ENCOUNTER — Emergency Department
Admission: EM | Admit: 2022-05-22 | Discharge: 2022-05-22 | Discharge status: Against Medical Advice | Payer: BC Managed Care – PPO | Attending: Emergency Medicine | Admitting: Emergency Medicine

## 2022-05-22 DIAGNOSIS — R079 Chest pain, unspecified: Secondary | ICD-10-CM | POA: Insufficient documentation

## 2022-05-22 DIAGNOSIS — Z5321 Procedure and treatment not carried out due to patient leaving prior to being seen by health care provider: Secondary | ICD-10-CM | POA: Diagnosis not present

## 2022-05-22 DIAGNOSIS — R0789 Other chest pain: Secondary | ICD-10-CM | POA: Diagnosis not present

## 2022-05-22 LAB — COMPREHENSIVE METABOLIC PANEL
ALT: 14 U/L (ref 0–44)
AST: 17 U/L (ref 15–41)
Albumin: 4.5 g/dL (ref 3.5–5.0)
Alkaline Phosphatase: 36 U/L — ABNORMAL LOW (ref 38–126)
Anion gap: 5 (ref 5–15)
BUN: 13 mg/dL (ref 6–20)
CO2: 27 mmol/L (ref 22–32)
Calcium: 8.9 mg/dL (ref 8.9–10.3)
Chloride: 103 mmol/L (ref 98–111)
Creatinine, Ser: 0.66 mg/dL (ref 0.44–1.00)
GFR, Estimated: >60 mL/min (ref >60)
Glucose, Bld: 107 mg/dL — ABNORMAL HIGH (ref 70–99)
Potassium: 3.5 mmol/L (ref 3.5–5.1)
Sodium: 135 mmol/L (ref 135–145)
Total Bilirubin: 0.6 mg/dL (ref 0.3–1.2)
Total Protein: 6.8 g/dL (ref 6.5–8.1)

## 2022-05-22 LAB — CBC
HCT: 38.5 % (ref 36.0–46.0)
Hemoglobin: 12.2 g/dL (ref 12.0–15.0)
MCH: 32.7 pg (ref 26.0–34.0)
MCHC: 31.7 g/dL (ref 30.0–36.0)
MCV: 103.2 fL — ABNORMAL HIGH (ref 80.0–100.0)
Platelets: 211 10*3/uL (ref 150–400)
RBC: 3.73 MIL/uL — ABNORMAL LOW (ref 3.87–5.11)
RDW: 12.1 % (ref 11.5–15.5)
WBC: 5.9 10*3/uL (ref 4.0–10.5)
nRBC: 0 % (ref 0.0–0.2)

## 2022-05-22 LAB — TSH: TSH: 3.074 u[IU]/mL (ref 0.350–4.500)

## 2022-05-22 LAB — T4, FREE: Free T4: 0.89 ng/dL (ref 0.61–1.12)

## 2022-05-22 LAB — TROPONIN I (HIGH SENSITIVITY): Troponin I (High Sensitivity): <2 ng/L (ref <18)

## 2022-05-22 NOTE — Telephone Encounter (Signed)
Patient advised and verbalized understand.

## 2022-05-22 NOTE — ED Provider Triage Note (Signed)
Emergency Medicine Provider Triage Evaluation Note  Rachel Alexander , a 41 y.o. female  was evaluated in triage.  Pt complains of CP that started one week ago.  No prior cardiac history.  New thyroid medication.   Review of Systems  Positive: +CP, short of breath Negative: No URI sx, nausea or vomiting  Physical Exam  BP 115/74 (BP Location: Left Arm)   Pulse 84   Temp 98 F (36.7 C) (Oral)   Resp 20   Ht '5\' 2"'$  (1.575 m)   Wt 48.1 kg   LMP 05/16/2022   SpO2 98%   BMI 19.39 kg/m  Gen:   Awake, no distress   Resp:  Normal effort  MSK:   Moves extremities without difficulty  Other:    Medical Decision Making  Medically screening exam initiated at 9:32 AM.  Appropriate orders placed.  Mareta Chesnut was informed that the remainder of the evaluation will be completed by another provider, this initial triage assessment does not replace that evaluation, and the importance of remaining in the ED until their evaluation is complete.     Johnn Hai, PA-C 05/22/22 [phone]

## 2022-05-22 NOTE — ED Triage Notes (Signed)
Pt in with co cp that started last week after being started on an additional thyroid med. Pt denies cardiac history, states symptoms remain persistent.

## 2022-05-22 NOTE — Telephone Encounter (Signed)
Patient states she started the Liothyronine last week but today she is having abdominal pain, chest pressure, papillations, and she is sweating. Patient is currently at the ED but wants to know what you advise.

## 2022-05-23 ENCOUNTER — Ambulatory Visit: Payer: BC Managed Care – PPO | Admitting: Gastroenterology

## 2022-05-23 ENCOUNTER — Other Ambulatory Visit: Payer: Self-pay

## 2022-05-23 NOTE — Progress Notes (Deleted)
Jonathon Bellows MD, MRCP(U.K) [address]  [place]  [place], [place] [address]  Main: [phone]  Fax: [phone]   Primary Care Physician: Burnard Hawthorne, FNP  Primary Gastroenterologist:  Dr. Jonathon Bellows   No chief complaint on file.   HPI: Rachel Alexander is a 41 y.o. female  Summary of history :   She is here today to see me as a hospital follow-up for severe constipation.  I was consulted to see her on 08/22/2019 when she was admitted with severe constipation with features of bowel obstruction.  Responded to bowel cleanout.  TSH was 229.  Felt that severe constipation was due to severe hypothyroidism.  She could go up to 2 weeks without a bowel movement.  On 08/24/2019 I performed a colonoscopy after a 2-day prep there was some fullness felt anteriorly on digital rectal exam.  Felt to be pushing into the rectum externally.  Prep of the colon was fair.  Otherwise the colon appeared normal.  Commenced on Trulance with samples from my office.  She is here today for follow-up.   08/30/2019 transvaginal pelvic ultrasound did not show anything apart from a 1 cm uterine fibroid.  She is he has a GYN follow-up    Interval history   09/03/2019-05/23/2022        She has been started on 150 mcg of Synthroid.  Still having issues constipation but much better than what it was previously.  Taking Trulance every day.  Has taken additional Dulcolax in order enema.  Stools are definitely softer than what they were previously.  No significant abdominal pain.    Current Outpatient Medications  Medication Sig Dispense Refill   ALPRAZolam (XANAX) 1 MG tablet Take 1 tablet (1 mg total) by mouth 2 (two) times daily as needed for anxiety. 60 tablet 2   amphetamine-dextroamphetamine (ADDERALL) 30 MG tablet Take 30 mg by mouth daily.     CRANBERRY PO Take by mouth.     FLUoxetine (PROZAC) 20 MG capsule Take 1 capsule (20 mg total) by mouth daily. 90 capsule 3   FLUoxetine (PROZAC) 40 MG  capsule TAKE 1 CAPSULE (40 MG TOTAL) BY MOUTH EVERY MORNING. 90 capsule 4   liothyronine (CYTOMEL) 5 MCG tablet Take 1 tablet (5 mcg total) by mouth daily. 90 tablet 3   pantoprazole (PROTONIX) 40 MG tablet Take 1 tablet (40 mg total) by mouth daily. 30 tablet 3   SUMAtriptan (IMITREX) 25 MG tablet TAKE 1 TAB BY MOUTH DAILY. MAY REPEAT ONCE 2 HRS LATER IF HEADACHE PERSISTS OR RECURS. MAX 2 DOSES. 9 tablet 2   SUMAtriptan (IMITREX) 25 MG tablet TAKE 1 TAB BY MOUTH DAILY. MAY REPEAT ONCE 2 HRS LATER IF HEADACHE PERSISTS OR RECURS. MAX 2 DOSES. 9 tablet 2   TIROSINT 150 MCG CAPS Take 1 capsule (150 mcg total) by mouth daily in the afternoon. 90 capsule 3   valACYclovir (VALTREX) 500 MG tablet TAKE 500 MG BY MOUTH X EVERY 12 HOURS X 3 DAYS. ASAP W/IN 24H OF SYMPTOMS ONSET. 18 tablet 2   No current facility-administered medications for this visit.    Allergies as of 05/23/2022   (No Known Allergies)    ROS:  General: Negative for anorexia, weight loss, fever, chills, fatigue, weakness. ENT: Negative for hoarseness, difficulty swallowing , nasal congestion. CV: Negative for chest pain, angina, palpitations, dyspnea on exertion, peripheral edema.  Respiratory: Negative for dyspnea at rest, dyspnea on exertion, cough, sputum, wheezing.  GI: See history  of present illness. GU:  Negative for dysuria, hematuria, urinary incontinence, urinary frequency, nocturnal urination.  Endo: Negative for unusual weight change.    Physical Examination:   LMP 05/16/2022   General: Well-nourished, well-developed in no acute distress.  Eyes: No icterus. Conjunctivae pink. Mouth: Oropharyngeal mucosa moist and pink , no lesions erythema or exudate. Lungs: Clear to auscultation bilaterally. Non-labored. Heart: Regular rate and rhythm, no murmurs rubs or gallops.  Abdomen: Bowel sounds are normal, nontender, nondistended, no hepatosplenomegaly or masses, no abdominal bruits or hernia , no rebound or guarding.    Extremities: No lower extremity edema. No clubbing or deformities. Neuro: Alert and oriented x 3.  Grossly intact. Skin: Warm and dry, no jaundice.   Psych: Alert and cooperative, normal mood and affect.   Imaging Studies: DG Chest 2 View  Result Date: 05/22/2022 CLINICAL DATA:  Provided history: Chest pain. EXAM: CHEST - 2 VIEW COMPARISON:  Chest radiographs 02/15/2022 and earlier FINDINGS: Heart size within normal limits. No appreciable airspace consolidation. No evidence of pleural effusion or pneumothorax. No acute bony abnormality identified. IMPRESSION: No evidence of active cardiopulmonary disease. Electronically Signed   By: Kellie Simmering D.O.   On: 05/22/2022 09:50    Assessment and Plan:   Rachel Alexander is a 41 y.o. y/o female admitted to hospital in March 2021 with severe constipation to the point of bowel obstruction. For constipation secondary to severe hypothyroidism. Dose of thyroxine has been increased. Commenced on Trulance 2 weeks back. Doing better than what she has been in the past but still not yet having regular soft bowel movements. I believe that it will only gradually get better as her hypothyroidism gets colitis. I do also believe she is due to see an endocrinologist soon. I suggested that in addition to the Trulance we added lactulose 20 cc 3 times a day. And be titrated to the desired result and I will gradually decrease the dose as her hypothyroidism gets corrected. She can still use MiraLAX/enemas as needed on days she does not have an adequate bowel movement. I will call her back in a week to check how she is doing and see if we can make changes to her bowel regimen to make her feel more comfortable.    Dr Jonathon Bellows  MD,MRCP [facility]) Follow up in ***

## 2022-05-25 ENCOUNTER — Telehealth: Payer: Self-pay

## 2022-05-25 NOTE — Telephone Encounter (Signed)
Transition Care Management Unsuccessful Follow-up Telephone Call  Date of discharge and from where:  05/22/22 [facility]  Attempts:  1st Attempt  Reason for unsuccessful TCM follow-up call:  Unable to reach patient

## 2022-05-29 ENCOUNTER — Encounter: Payer: Self-pay | Admitting: Family

## 2022-05-29 ENCOUNTER — Ambulatory Visit: Payer: BC Managed Care – PPO | Admitting: Family

## 2022-05-29 VITALS — BP 100/70 | HR 71 | Temp 97.7°F | Wt 103.6 lb

## 2022-05-29 DIAGNOSIS — F32A Depression, unspecified: Secondary | ICD-10-CM | POA: Diagnosis not present

## 2022-05-29 DIAGNOSIS — F419 Anxiety disorder, unspecified: Secondary | ICD-10-CM | POA: Diagnosis not present

## 2022-05-29 DIAGNOSIS — E039 Hypothyroidism, unspecified: Secondary | ICD-10-CM | POA: Diagnosis not present

## 2022-05-29 MED ORDER — ALPRAZOLAM 1 MG PO TABS: 1.0000 mg | ORAL_TABLET | Freq: Two times a day (BID) | ORAL | 2 refills | Status: DC | PRN

## 2022-05-29 NOTE — Progress Notes (Signed)
Assessment & Plan:  Anxiety and depression Assessment & Plan: Chronic,uncontrolled. She reports symptoms of bipolar and we agreed not to increase Prozac 60 mg until evaluated by psychiatry. Referral placed. Discussed hypothyroidism as likely exacerbating as well.  She is taking xanax '1mg'$  bid. Will follow  Orders: -     Ambulatory referral to Psychiatry -     ALPRAZolam; Take 1 tablet (1 mg total) by mouth 2 (two) times daily as needed for anxiety.  Dispense: 60 tablet; Refill: 2  Acquired hypothyroidism     Return precautions given.   Risks, benefits, and alternatives of the medications and treatment plan prescribed today were discussed, and patient expressed understanding.   Education regarding symptom management and diagnosis given to patient on AVS either electronically or printed.  No follow-ups on file.  Rachel Paris, FNP  Subjective:    Patient ID: Rachel Alexander, female    DOB: [birth year], 41 y.o.   MRN: [id number]  CC: Rachel Alexander is a 41 y.o. female who presents today for follow up.   HPI: Anxiety remains uncontrolled.  She has filled out paperwork to see a counselor  She is compliant with Prozac 60 mg although does not think has been very effective for her.  She is taking Xanax 1 mg twice daily  Tried paxil, celexa, trazodone, wellbutrin, zoloft, effexor in the past.   Endorses period of having more energy than usual, didn't require sleep, and spending more money than usual and got into trouble.   Reports that these acts caused trouble financially, or with family or personal relationships.   Her mother has h/o bipolar  Denies SI/HI  Reports increased anxiety , palpitations when started on Cytomel. Since improved when she stopped medication.  Dr Kelton Pillar advised to stop Cytomel per patient.   Seen by endocrine 05/09/2022, and changed from Synthroid to [place] and Cytomel.          Allergies: Patient has no known allergies. Current Outpatient  Medications on File Prior to Visit  Medication Sig Dispense Refill   amphetamine-dextroamphetamine (ADDERALL) 30 MG tablet Take 30 mg by mouth daily.     CRANBERRY PO Take by mouth.     FLUoxetine (PROZAC) 20 MG capsule Take 1 capsule (20 mg total) by mouth daily. 90 capsule 3   FLUoxetine (PROZAC) 40 MG capsule TAKE 1 CAPSULE (40 MG TOTAL) BY MOUTH EVERY MORNING. 90 capsule 4   pantoprazole (PROTONIX) 40 MG tablet Take 1 tablet (40 mg total) by mouth daily. 30 tablet 3   SUMAtriptan (IMITREX) 25 MG tablet TAKE 1 TAB BY MOUTH DAILY. MAY REPEAT ONCE 2 HRS LATER IF HEADACHE PERSISTS OR RECURS. MAX 2 DOSES. 9 tablet 2   SUMAtriptan (IMITREX) 25 MG tablet TAKE 1 TAB BY MOUTH DAILY. MAY REPEAT ONCE 2 HRS LATER IF HEADACHE PERSISTS OR RECURS. MAX 2 DOSES. 9 tablet 2   TIROSINT 150 MCG CAPS Take 1 capsule (150 mcg total) by mouth daily in the afternoon. 90 capsule 3   valACYclovir (VALTREX) 500 MG tablet TAKE 500 MG BY MOUTH X EVERY 12 HOURS X 3 DAYS. ASAP W/IN 24H OF SYMPTOMS ONSET. 18 tablet 2   [DISCONTINUED] dicyclomine (BENTYL) 20 MG tablet Take 1 tablet (20 mg total) by mouth 2 (two) times daily. 20 tablet 0   No current facility-administered medications on file prior to visit.    Review of Systems  Constitutional:  Negative for chills and fever.  Respiratory:  Negative for cough.   Cardiovascular:  Negative for  chest pain and palpitations.  Gastrointestinal:  Negative for nausea and vomiting.  Psychiatric/Behavioral:  Negative for sleep disturbance and suicidal ideas. The patient is nervous/anxious.       Objective:    BP 100/70   Pulse 71   Temp 97.7 F (36.5 C) (Oral)   Wt 103 lb 9.6 oz (47 kg)   LMP 05/16/2022 (Exact Date)   SpO2 98%   BMI 18.95 kg/m  BP Readings from Last 3 Encounters:  05/29/22 100/70  05/22/22 115/74  05/09/22 104/68   Wt Readings from Last 3 Encounters:  05/29/22 103 lb 9.6 oz (47 kg)  05/22/22 106 lb (48.1 kg)  05/09/22 106 lb (48.1 kg)     Physical Exam Vitals reviewed.  Constitutional:      Appearance: She is well-developed.  Eyes:     Conjunctiva/sclera: Conjunctivae normal.  Cardiovascular:     Rate and Rhythm: Normal rate and regular rhythm.     Pulses: Normal pulses.     Heart sounds: Normal heart sounds.  Pulmonary:     Effort: Pulmonary effort is normal.     Breath sounds: Normal breath sounds. No wheezing, rhonchi or rales.  Skin:    General: Skin is warm and dry.  Neurological:     Mental Status: She is alert.  Psychiatric:        Speech: Speech normal.        Behavior: Behavior normal.        Thought Content: Thought content normal.

## 2022-05-29 NOTE — Patient Instructions (Signed)
Referral to psychiatry.   Let us know if you dont hear back within a week in regards to an appointment being scheduled.   So that you are aware, if you are [facility] MyChart user , please pay attention to your MyChart messages as you may receive a MyChart message with a phone number to call and schedule this test/appointment own your own from our referral coordinator. This is a new process so I do not want you to miss this message.  If you are not a MyChart user, you will receive a phone call.

## 2022-05-29 NOTE — Assessment & Plan Note (Addendum)
Chronic,uncontrolled. She reports symptoms of bipolar and we agreed not to increase Prozac 60 mg until evaluated by psychiatry. Referral placed. Discussed hypothyroidism as likely exacerbating as well.  She is taking xanax '1mg'$  bid. Will follow

## 2022-07-03 DIAGNOSIS — J019 Acute sinusitis, unspecified: Secondary | ICD-10-CM | POA: Diagnosis not present

## 2022-07-04 ENCOUNTER — Other Ambulatory Visit: Payer: BC Managed Care – PPO

## 2022-07-05 ENCOUNTER — Ambulatory Visit (INDEPENDENT_AMBULATORY_CARE_PROVIDER_SITE_OTHER): Payer: BC Managed Care – PPO | Admitting: Clinical

## 2022-07-05 DIAGNOSIS — F419 Anxiety disorder, unspecified: Secondary | ICD-10-CM | POA: Diagnosis not present

## 2022-07-05 DIAGNOSIS — F4310 Post-traumatic stress disorder, unspecified: Secondary | ICD-10-CM | POA: Diagnosis not present

## 2022-07-05 DIAGNOSIS — F332 Major depressive disorder, recurrent severe without psychotic features: Secondary | ICD-10-CM
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
## 2022-07-05 NOTE — Progress Notes (Signed)
                Katherina Right, LCSW

## 2022-07-13 ENCOUNTER — Other Ambulatory Visit: Payer: Self-pay | Admitting: Family

## 2022-07-13 DIAGNOSIS — R519 Headache, unspecified: Secondary | ICD-10-CM

## 2022-07-27 DIAGNOSIS — N390 Urinary tract infection, site not specified: Secondary | ICD-10-CM | POA: Diagnosis not present

## 2022-07-31 ENCOUNTER — Ambulatory Visit: Payer: BC Managed Care – PPO | Admitting: Clinical

## 2022-08-04 ENCOUNTER — Ambulatory Visit: Payer: BC Managed Care – PPO | Admitting: Psychiatry

## 2022-08-09 ENCOUNTER — Encounter: Payer: Self-pay | Admitting: Internal Medicine

## 2022-08-10 MED ORDER — SYNTHROID 150 MCG PO TABS: 150.0000 ug | ORAL_TABLET | Freq: Every day | ORAL | 3 refills | Status: DC

## 2022-09-09 ENCOUNTER — Other Ambulatory Visit: Payer: Self-pay | Admitting: Family

## 2022-09-09 DIAGNOSIS — F419 Anxiety disorder, unspecified: Secondary | ICD-10-CM

## 2022-09-10 ENCOUNTER — Encounter: Payer: Self-pay | Admitting: Family

## 2022-09-12 NOTE — Telephone Encounter (Signed)
Spoke to pt and schedule f/up appt for 09/27/22 @ 4pm

## 2022-09-15 ENCOUNTER — Telehealth: Payer: Self-pay

## 2022-09-15 ENCOUNTER — Other Ambulatory Visit (HOSPITAL_COMMUNITY): Payer: Self-pay

## 2022-09-15 NOTE — Telephone Encounter (Signed)
Patient Advocate Encounter   Received notification from [facility] that prior authorization is required for Pantoprazole Sodium 40MG  dr tablets   Submitted: 09-15-2022 Key [id number]  Status is pending

## 2022-09-18 ENCOUNTER — Ambulatory Visit: Payer: BC Managed Care – PPO | Admitting: Internal Medicine

## 2022-09-18 NOTE — Progress Notes (Deleted)
Name: Rachel Alexander  MRN/ DOB: [id number], [birth year]    Age/ Sex: 42 y.o., female     PCP: Burnard Hawthorne, FNP   Reason for Endocrinology Evaluation: Hypothyroidism     Initial Endocrinology Clinic Visit: 10/09/2019    PATIENT IDENTIFIER: Rachel Alexander is a 42 y.o., female with a past medical history of Depression, anxiety and hypothyroidism.  She has followed with [facility] since 10/09/2019 for consultative assistance with management of her hypothyroidism      HISTORICAL SUMMARY:  Pt was diagnosed with hyperthyroidism and goiter at age 79, was put on oral medications ( does not recall ) but over the years  she became hypothyroid requiring LT-4 replacement. She was initially on armour thyroid but was switched synthroid  In 08/2019.     Cousins with thyroid disease   Due to persistent elevation of TSH, she was started on liothyronine 5 mcg but developed palpitations requiring an ED visit 04/2022 so it was discontinued, but her TSH was within normal range a month later  at 3.074  SUBJECTIVE:    Today (09/18/2022):  Rachel Alexander is here for a follow up on hypothyroidism .   Weight has been stable  Denies constipation no  diarrhea  She continues with moodiness and fatigue  Has noted right neck soreness last night  She stopped Prilosec  She stopped Collagen     Tirosint 150 mcg  ,1 tab daily       HISTORY:  Past Medical History:  Past Medical History:  Diagnosis Date   Colitis    Depression    Frequent headaches    Hypothyroidism    Migraines    Pituitary tumor    Suicidal intent    Past Surgical History:  Past Surgical History:  Procedure Laterality Date   BREAST CYST ASPIRATION Right 2012   COLONOSCOPY N/A 08/24/2019   Procedure: COLONOSCOPY;  Surgeon: Jonathon Bellows, MD;  Location: [facility] ENDOSCOPY;  Service: Gastroenterology;  Laterality: N/A;   TONSILLECTOMY     TUBAL LIGATION     Social History:  reports that she quit smoking about 4  years ago. Her smoking use included cigarettes. She smoked an average of 1 pack per day. She has never used smokeless tobacco. She reports current alcohol use. She reports that she does not use drugs. Family History:  Family History  Problem Relation Age of Onset   Healthy Mother    Liver cancer Father 70   Diabetes Sister    Breast cancer Maternal Aunt      HOME MEDICATIONS: Allergies as of 09/18/2022   No Known Allergies      Medication List        Accurate as of September 18, 2022 10:17 AM. If you have any questions, ask your nurse or doctor.          Adderall 30 MG tablet Generic drug: amphetamine-dextroamphetamine Take 30 mg by mouth daily.   ALPRAZolam 1 MG tablet Commonly known as: XANAX TAKE 1 TABLET BY MOUTH 2 TIMES DAILY AS NEEDED FOR ANXIETY.   CRANBERRY PO Take by mouth.   FLUoxetine 20 MG capsule Commonly known as: PROzac Take 1 capsule (20 mg total) by mouth daily.   FLUoxetine 40 MG capsule Commonly known as: PROZAC TAKE 1 CAPSULE (40 MG TOTAL) BY MOUTH EVERY MORNING.   pantoprazole 40 MG tablet Commonly known as: PROTONIX Take 1 tablet (40 mg total) by mouth daily.   SUMAtriptan 25 MG tablet Commonly known as: IMITREX  TAKE 1 TAB BY MOUTH DAILY. MAY REPEAT ONCE 2 HRS LATER IF HEADACHE PERSISTS OR RECURS. MAX 2 DOSES.   SUMAtriptan 25 MG tablet Commonly known as: IMITREX TAKE 1 TAB BY MOUTH DAILY. MAY REPEAT ONCE 2 HRS LATER IF HEADACHE PERSISTS OR RECURS. MAX 2 DOSES.   Synthroid 150 MCG tablet Generic drug: levothyroxine Take 1 tablet (150 mcg total) by mouth daily before breakfast.   valACYclovir 500 MG tablet Commonly known as: VALTREX TAKE 500 MG BY MOUTH X EVERY 12 HOURS X 3 DAYS. ASAP W/IN 24H OF SYMPTOMS ONSET.          OBJECTIVE:   PHYSICAL EXAM: VS: There were no vitals taken for this visit.  EXAM: General: Pt appears well and is in NAD  Neck: General: Supple without adenopathy. Thyroid: Thyroid size normal.  No goiter  or nodules appreciated.  Lungs: Clear with good BS bilat with no rales, rhonchi, or wheezes  Heart: Auscultation: RRR.  Abdomen: Normoactive bowel sounds, soft, nontender, without masses or organomegaly palpable  Extremities:  BL LE: No pretibial edema normal ROM and strength.  Mental Status: Judgment, insight: Intact Orientation: Oriented to time, place, and person Mood and affect: No depression, anxiety, or agitation     DATA REVIEWED:  Latest Reference Range & Units 05/15/22 15:49  TSH 0.35 - 5.50 uIU/mL 13.39 (H)  (H): Data is abnormally high   ASSESSMENT / PLAN / RECOMMENDATIONS:   Hypothyroidism:    -It has been difficult optimizing her TSH level, she assures me compliance and I suspect she inability to absorb LT-4  -We even switch from  Synthroid to Tirosint but TSH remains above goal  - Will start Liothyronine as below    Medications   Continue  Tirosint 150 mcg daily Start Liothyronine 5 mcg daily     F/U in 4 months  Labs in 6 weeks   Signed electronically by: Mack Guise, MD  [facility]  [facility] [address] [place], [place] [address] Phone: [phone] FAX: [phone]      CC: Burnard Hawthorne, FNP [address] [place] [address] Phone: [phone]  Fax: [phone]   Return to Endocrinology clinic as below: Future Appointments  Date Time Provider [place]  09/18/2022  3:00 PM Coby Shrewsberry, Melanie Crazier, MD [facility] None  09/26/2022  2:00 PM Ursula Alert, MD [facility] None  09/27/2022  4:00 PM Arnett, Yvetta Coder, FNP [facility]

## 2022-09-25 NOTE — Telephone Encounter (Signed)
Patient Advocate Encounter  Received a fax from [facility] [employer] regarding Prior Authorization for Pantoprazole Sodium 40MG  dr tablets .   Authorization has been DENIED due to patient must try/fail TWO over-the-counter generic proton pump inhibitors or have documentation on why patient cannot take ALL other OTC generic PPI medications.  Determination letter attached to patient chart

## 2022-09-26 ENCOUNTER — Ambulatory Visit (INDEPENDENT_AMBULATORY_CARE_PROVIDER_SITE_OTHER): Payer: BC Managed Care – PPO | Admitting: Psychiatry

## 2022-09-26 ENCOUNTER — Encounter: Payer: Self-pay | Admitting: Psychiatry

## 2022-09-26 VITALS — BP 107/72 | HR 73 | Temp 97.7°F | Ht 62.6 in | Wt 109.4 lb

## 2022-09-26 DIAGNOSIS — F4310 Post-traumatic stress disorder, unspecified: Secondary | ICD-10-CM | POA: Insufficient documentation

## 2022-09-26 DIAGNOSIS — Z79899 Other long term (current) drug therapy: Secondary | ICD-10-CM

## 2022-09-26 DIAGNOSIS — F3162 Bipolar disorder, current episode mixed, moderate: Secondary | ICD-10-CM

## 2022-09-26 MED ORDER — CLONAZEPAM 0.5 MG PO TABS: 0.2500 mg | ORAL_TABLET | Freq: Two times a day (BID) | ORAL | 0 refills | Status: DC

## 2022-09-26 MED ORDER — CLONAZEPAM 0.5 MG PO TABS: 0.5000 mg | ORAL_TABLET | Freq: Two times a day (BID) | ORAL | 0 refills | Status: DC

## 2022-09-26 MED ORDER — CLONAZEPAM 0.5 MG PO TABS: 0.7500 mg | ORAL_TABLET | ORAL | 0 refills | Status: DC

## 2022-09-26 MED ORDER — DIVALPROEX SODIUM 250 MG PO DR TAB: 250.0000 mg | DELAYED_RELEASE_TABLET | Freq: Three times a day (TID) | ORAL | 0 refills | Status: DC

## 2022-09-26 NOTE — Patient Instructions (Addendum)
Start taking Depakote 250 mg 3 times a day for 5 days, and on the sixth day morning go to the lab at [facility] before taking a morning dosage of Depakote.  All the lab orders are in the system and you just have to go there and get your blood drawn.  Please also get a urine drug screen done that day.  Please follow the instructions on the bottle for the Klonopin, you are being tapered down.  You will have to pick up the prescription for Klonopin weekly at the pharmacy. Please stop taking Xanax.  It is good to hold the Adderall for now since you are being tapered off of the Xanax since Adderall is a stimulant and it can lower seizure threshold.  When you are being tapered off of the Xanax you can have withdrawal symptoms including seizures.  If you have significant withdrawal symptoms please call 911 or go to the nearest emergency department.    [email or web address]  www.psychologytoday  [facility] [email or web address] [address], [place], [place] [address]  [phone]  [facility] [email or web address] [address], [place], [place] [address]   [phone]   [facility] - [phone]  [facility] - [phone]  [facility] - [phone]   [facility] [phone]- [phone]  [facility] [phone]- [phone]   [email or web address] this clinician can offer telehealth and has a sliding scale option  [email or web address]://[email or web address]-gentry.[email or web address]://www.[email or web address]/ this group also offers sliding scale rates and is based out of [place]  Dr. Liborio NixonKeisha Rogers with the [facility] specializes in divorce  [facility] has interns who offer sliding scale rates and some of the full time clinicians do, as well. You complete their contact form on their website and the referrals coordinator will help to get connected to someone     Divalproex Sodium Delayed- or Extended-Release Tablets What is this medication? DIVALPROEX SODIUM (dye VAL pro ex SO dee um) prevents and controls seizures in people with epilepsy. It may also be used to prevent migraine headaches. It can also be used to treat bipolar disorder. It works by calming overactive nerves in your body. This medicine may be used for other purposes; ask your health care provider or pharmacist if you have questions. COMMON BRAND NAME(S): Depakote, Depakote ER What should I tell my care team before I take this medication? They need to know if you have any of these conditions: Frequently drink alcohol Kidney disease Liver disease Low platelet counts Mitochondrial disease Suicidal thoughts, plans, or attempt by you or a family member Urea cycle disorder (UCD) An unusual or allergic reaction to divalproex sodium, sodium valproate, valproic acid, other medications, foods, dyes, or preservatives Pregnant or trying to get pregnant Breast-feeding How should I use this medication? Take this medication by mouth with a drink of water. Follow the directions on the prescription label. Do not cut, crush or chew this medication. You can take it with or without food. If it upsets your stomach, take it with food. Take your medication at regular intervals. Do not take it more often than directed. Do not stop taking except on your care team's advice. A special MedGuide will be given to you by the pharmacist with each prescription and refill. Be sure to read this information carefully each time. Talk to your care team about the use of this medication in children. While this medication may be prescribed for children as young  as 10 years for selected conditions, precautions do apply. Overdosage: If you think you have taken too much of this medicine contact a poison control center or emergency room at once. NOTE: This medicine is only for you. Do not share this medicine with  others. What if I miss a dose? If you miss a dose, take it as soon as you can. If it is almost time for your next dose, take only that dose. Do not take double or extra doses. What may interact with this medication? Do not take this medication with any of the following: Sodium phenylbutyrate This medication may also interact with the following: Aspirin Certain antibiotics, such as ertapenem, imipenem, meropenem Certain medications for depression, anxiety, or other mental health conditions Certain medications for seizures, such as cannabidiol, carbamazepine, clonazepam, diazepam, ethosuximide, felbamate, lamotrigine, phenobarbital, phenytoin, primidone, rufinamide, topiramate Certain medications that treat or prevent blood clots, such as warfarin Cholestyramine Estrogen and progestin hormones Methotrexate Propofol Rifampin Ritonavir Tolbutamide Zidovudine This list may not describe all possible interactions. Give your health care provider a list of all the medicines, herbs, non-prescription drugs, or dietary supplements you use. Also tell them if you smoke, drink alcohol, or use illegal drugs. Some items may interact with your medicine. What should I watch for while using this medication? Tell your care team if your symptoms do not get better or they start to get worse. This medication may cause serious skin reactions. They can happen weeks to months after starting the medication. Contact your care team right away if you notice fevers or flu-like symptoms with a rash. The rash may be red or purple and then turn into blisters or peeling of the skin. Or, you might notice a red rash with swelling of the face, lips or lymph nodes in your neck or under your arms. Wear a medical ID bracelet or chain, and carry a card that describes your disease and details of your medication and dosage times. You may get drowsy, dizzy, or have blurred vision. Do not drive, use machinery, or do anything that needs  mental alertness until you know how this medication affects you. To reduce dizzy or fainting spells, do not sit or stand up quickly, especially if you are an older patient. Alcohol can increase drowsiness and dizziness. Avoid alcoholic drinks. This medication can make you more sensitive to the sun. Keep out of the sun. If you cannot avoid being in the sun, wear protective clothing and use sunscreen. Do not use sun lamps or tanning beds/booths. Patients and their families should watch out for new or worsening depression or thoughts of suicide. Also watch out for sudden changes in feelings such as feeling anxious, agitated, panicky, irritable, hostile, aggressive, impulsive, severely restless, overly excited and hyperactive, or not being able to sleep. If this happens, especially at the beginning of treatment or after a change in dose, call your care team. Women should inform their care team if they wish to become pregnant or think they might be pregnant. There is a potential for serious side effects to an unborn child. Talk to your care team or pharmacist for more information. Women who become pregnant while using this medication may enroll in the [place] Antiepileptic Drug Pregnancy Registry by calling [phone]. This registry collects information about the safety of antiepileptic medication use during pregnancy. This medication may cause a decrease in folic acid and vitamin D. You should make sure that you get enough vitamins while you are taking this medication. Discuss the  foods you eat and the vitamins you take with your care team. What side effects may I notice from receiving this medication? Side effects that you should report to your care team as soon as possible: Allergic reactions--skin rash, itching, hives, swelling of the face, lips, tongue, or throat High ammonia level--unusual weakness or fatigue, confusion, loss of appetite, nausea, vomiting, seizures Liver injury--right upper  belly pain, loss of appetite, nausea, light-colored stool, dark yellow or brown urine, yellowing skin or eyes, unusual weakness or fatigue Low body temperature, drowsiness, confusion Pancreatitis--severe stomach pain that spreads to your back or gets worse after eating or when touched, fever, nausea, vomiting Rash, fever, and swollen lymph nodes Thoughts of suicide or self-harm, worsening mood, feelings of depression Unusual bruising or bleeding Side effects that usually do not require medical attention (report to your care team if they continue or are bothersome): Change in vision Dizziness Drowsiness Hair loss Headache Nausea Tremors or shaking Weight gain This list may not describe all possible side effects. Call your doctor for medical advice about side effects. You may report side effects to FDA at 1-800-FDA-1088. Where should I keep my medication? Keep out of reach of children and pets. Store at room temperature between 15 and 30 degrees C (59 and 86 degrees F). Keep container tightly closed. Throw away any unused medication after the expiration date. NOTE: This sheet is a summary. It may not cover all possible information. If you have questions about this medicine, talk to your doctor, pharmacist, or health care provider.  2023 Elsevier/Gold Standard (2021-09-14 00:00:00)
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
## 2022-09-26 NOTE — Progress Notes (Deleted)
   Assessment & Plan:  There are no diagnoses linked to this encounter.   Return precautions given.   Risks, benefits, and alternatives of the medications and treatment plan prescribed today were discussed, and patient expressed understanding.   Education regarding symptom management and diagnosis given to patient on AVS either electronically or printed.  No follow-ups on file.  Rennie Plowman, FNP  Subjective:    Patient ID: Charyl Bigger, female    DOB: Feb 08, 1981, 42 y.o.   MRN: [id number]  CC: Zarah Boys is a 42 y.o. female who presents today for follow up.   HPI: Protonix is not covered.    Allergies: Patient has no known allergies. Current Outpatient Medications on File Prior to Visit  Medication Sig Dispense Refill   SYNTHROID 150 MCG tablet Take 1 tablet (150 mcg total) by mouth daily before breakfast. 90 tablet 3   ALPRAZolam (XANAX) 1 MG tablet TAKE 1 TABLET BY MOUTH 2 TIMES DAILY AS NEEDED FOR ANXIETY. 60 tablet 0   amphetamine-dextroamphetamine (ADDERALL) 30 MG tablet Take 30 mg by mouth daily.     CRANBERRY PO Take by mouth.     FLUoxetine (PROZAC) 20 MG capsule Take 1 capsule (20 mg total) by mouth daily. 90 capsule 3   FLUoxetine (PROZAC) 40 MG capsule TAKE 1 CAPSULE (40 MG TOTAL) BY MOUTH EVERY MORNING. 90 capsule 4   pantoprazole (PROTONIX) 40 MG tablet Take 1 tablet (40 mg total) by mouth daily. 30 tablet 3   SUMAtriptan (IMITREX) 25 MG tablet TAKE 1 TAB BY MOUTH DAILY. MAY REPEAT ONCE 2 HRS LATER IF HEADACHE PERSISTS OR RECURS. MAX 2 DOSES. 9 tablet 2   SUMAtriptan (IMITREX) 25 MG tablet TAKE 1 TAB BY MOUTH DAILY. MAY REPEAT ONCE 2 HRS LATER IF HEADACHE PERSISTS OR RECURS. MAX 2 DOSES. 9 tablet 2   valACYclovir (VALTREX) 500 MG tablet TAKE 500 MG BY MOUTH X EVERY 12 HOURS X 3 DAYS. ASAP W/IN 24H OF SYMPTOMS ONSET. 18 tablet 2   [DISCONTINUED] dicyclomine (BENTYL) 20 MG tablet Take 1 tablet (20 mg total) by mouth 2 (two) times daily. 20 tablet 0   No  current facility-administered medications on file prior to visit.    Review of Systems    Objective:    There were no vitals taken for this visit. BP Readings from Last 3 Encounters:  05/29/22 100/70  05/22/22 115/74  05/09/22 104/68   Wt Readings from Last 3 Encounters:  05/29/22 103 lb 9.6 oz (47 kg)  05/22/22 106 lb (48.1 kg)  05/09/22 106 lb (48.1 kg)    Physical Exam

## 2022-09-27 ENCOUNTER — Ambulatory Visit: Payer: BC Managed Care – PPO | Admitting: Family

## 2022-09-27 MED ORDER — AMPHETAMINE-DEXTROAMPHETAMINE 5 MG PO TABS: 1.0000 | ORAL_TABLET | Freq: Two times a day (BID) | ORAL | Status: DC

## 2022-09-28 NOTE — Telephone Encounter (Signed)
Spoke to pt and was able to schedule appt for 10/04/22

## 2022-10-02 ENCOUNTER — Other Ambulatory Visit: Payer: Self-pay | Admitting: Family

## 2022-10-02 ENCOUNTER — Encounter: Payer: Self-pay | Admitting: Family

## 2022-10-02 NOTE — Telephone Encounter (Signed)
Spoke to pt and she  stated  that she will discuss on Wed at appt pt verbalized understanding in regards to not sending in refill for adderral

## 2022-10-02 NOTE — Progress Notes (Signed)
close

## 2022-10-04 ENCOUNTER — Ambulatory Visit: Payer: BC Managed Care – PPO | Admitting: Family

## 2022-10-09 ENCOUNTER — Other Ambulatory Visit: Payer: Self-pay | Admitting: Family

## 2022-10-09 DIAGNOSIS — R519 Headache, unspecified: Secondary | ICD-10-CM

## 2022-10-21 ENCOUNTER — Other Ambulatory Visit: Payer: Self-pay | Admitting: Psychiatry

## 2022-10-21 DIAGNOSIS — F4310 Post-traumatic stress disorder, unspecified: Secondary | ICD-10-CM

## 2022-10-21 DIAGNOSIS — F3162 Bipolar disorder, current episode mixed, moderate: Secondary | ICD-10-CM

## 2022-10-25 ENCOUNTER — Other Ambulatory Visit (HOSPITAL_COMMUNITY): Payer: Self-pay

## 2022-10-25 ENCOUNTER — Telehealth: Payer: Self-pay

## 2022-10-25 NOTE — Telephone Encounter (Signed)
pt called states she has not had her labwork done yet. pt was told that she needed to get labs done before her appt.

## 2022-10-25 NOTE — Telephone Encounter (Signed)
Noted  

## 2022-10-26 ENCOUNTER — Encounter: Payer: Self-pay | Admitting: Psychiatry

## 2022-10-26 ENCOUNTER — Ambulatory Visit (INDEPENDENT_AMBULATORY_CARE_PROVIDER_SITE_OTHER): Payer: 59 | Admitting: Psychiatry

## 2022-10-26 ENCOUNTER — Other Ambulatory Visit
Admission: RE | Admit: 2022-10-26 | Discharge: 2022-10-26 | Disposition: A | Discharge status: Home / Self Care | Payer: 59 | Attending: Psychiatry | Admitting: Psychiatry

## 2022-10-26 VITALS — BP 100/66 | HR 69 | Temp 98.0°F | Ht 62.6 in | Wt 106.2 lb

## 2022-10-26 DIAGNOSIS — F3162 Bipolar disorder, current episode mixed, moderate: Secondary | ICD-10-CM

## 2022-10-26 DIAGNOSIS — F4310 Post-traumatic stress disorder, unspecified: Secondary | ICD-10-CM | POA: Diagnosis not present

## 2022-10-26 DIAGNOSIS — R41840 Attention and concentration deficit: Secondary | ICD-10-CM

## 2022-10-26 DIAGNOSIS — Z79899 Other long term (current) drug therapy: Secondary | ICD-10-CM | POA: Insufficient documentation

## 2022-10-26 LAB — HEPATIC FUNCTION PANEL
ALT: 11 U/L (ref 0–44)
AST: 15 U/L (ref 15–41)
Albumin: 3.9 g/dL (ref 3.5–5.0)
Alkaline Phosphatase: 38 U/L (ref 38–126)
Bilirubin, Direct: <0.1 mg/dL (ref 0.0–0.2)
Total Bilirubin: 0.6 mg/dL (ref 0.3–1.2)
Total Protein: 6.9 g/dL (ref 6.5–8.1)

## 2022-10-26 LAB — PLATELET COUNT: Platelets: 198 10*3/uL (ref 150–400)

## 2022-10-26 LAB — SODIUM: Sodium: 136 mmol/L (ref 135–145)

## 2022-10-26 LAB — VALPROIC ACID LEVEL: Valproic Acid Lvl: 45 ug/mL — ABNORMAL LOW (ref 50.0–100.0)

## 2022-10-26 MED ORDER — DIVALPROEX SODIUM 500 MG PO DR TAB: 500.0000 mg | DELAYED_RELEASE_TABLET | Freq: Two times a day (BID) | ORAL | 1 refills | Status: DC

## 2022-10-26 MED ORDER — CLONAZEPAM 0.5 MG PO TABS: 0.2500 mg | ORAL_TABLET | Freq: Every day | ORAL | 0 refills | Status: DC

## 2022-10-26 NOTE — Patient Instructions (Signed)
Please start taking Klonopin half tablet of 0.5 mg which is 0.25 mg daily for 10 days and then start taking it every other day for another 5 to 6 days and then use it as needed if you have any worsening anxiety or withdrawal symptoms.

## 2022-10-26 NOTE — Progress Notes (Signed)
BH MD OP Progress Note  10/26/2022 11:52 AM Rachel Alexander  MRN:  [id number]  Chief Complaint:  Chief Complaint  Patient presents with   Follow-up   Medication Refill   Anxiety   Depression   HPI: Rachel Alexander is a 42 year old Caucasian female, legally separated, lives in [place], currently unemployed, has a history of bipolar disorder, PTSD, long-term use of benzodiazepine, hypothyroidism, migraine headaches was evaluated in office today.  Patient was last seen on 09/26/2022.  Patient today reports she has definitely noticed an improvement in her mood since being on the Depakote.  She does not have as much mood swings as she used to before.  She feels more relaxed.  She reports she had to quit her job end of April.  She reports she did not do well with what was going on at work although she loved her job.  She hence decided to quit working.  She reports it was hard for her to get used to the fact that she did not have a job to go back to the first few days.  She however has been making an attempt to spend time with her sister, a friend.  She also does things around the house which does keep her busy.  She however is planning to look at other job opportunities.  She reports she is okay financially for now.  Patient reports she is tolerating coming off of the clonazepam and currently takes just 1/2 tablet daily.  Agreeable to further tapering.  Denies any withdrawal symptoms.  Patient reports sleep is overall okay.  Denies any suicidality, homicidality or perceptual disturbances.  Patient denies any other concerns today.  Visit Diagnosis:    ICD-10-CM   1. Bipolar 1 disorder, mixed, moderate (HCC)  F31.62 divalproex (DEPAKOTE) 500 MG DR tablet    Valproic acid level    2. PTSD (post-traumatic stress disorder)  F43.10     3. Long-term current use of benzodiazepine  Z79.899 clonazePAM (KLONOPIN) 0.5 MG tablet    4. High risk medication use  Z79.899 Valproic acid level    5. Attention  and concentration deficit  R41.840       Past Psychiatric History: I have reviewed past psychiatric history from progress note on 09/26/2022.  Past trials of Lexapro, Prozac, Celexa, Wellbutrin, Adderall, risperidone, Xanax.  Patient with previous diagnosis of bipolar disorder, ADHD, anxiety.  Denies having any ADHD testing completed.  Past Medical History:  Past Medical History:  Diagnosis Date   Colitis    Depression    Frequent headaches    Hypothyroidism    Hypothyroidism    Migraines    Pituitary tumor    Suicidal intent     Past Surgical History:  Procedure Laterality Date   BREAST CYST ASPIRATION Right 2012   COLONOSCOPY N/A 08/24/2019   Procedure: COLONOSCOPY;  Surgeon: Wyline Mood, MD;  [facility];  Service: Gastroenterology;  Laterality: N/A;   TONSILLECTOMY     TUBAL LIGATION      Family Psychiatric History: Reviewed family psychiatric history from progress note on 09/26/2022.  Family History:  Family History  Problem Relation Age of Onset   Drug abuse Mother    Anxiety disorder Mother    Depression Mother    Healthy Mother    Schizophrenia Father    Liver cancer Father 57   ADD / ADHD Sister    Diabetes Sister    Drug abuse Brother    Alcohol abuse Brother    Depression Brother  ADD / ADHD Brother    Breast cancer Maternal Aunt    ADD / ADHD Half-Brother    Bipolar disorder Half-Brother    Schizophrenia Half-Brother     Social History: Reviewed social history from progress note on 09/26/2022 Social History   Socioeconomic History   Marital status: Legally Separated    Spouse name: Not on file   Number of children: 3   Years of education: Not on file   Highest education level: Some college, no degree  Occupational History   Not on file  Tobacco Use   Smoking status: Former    Packs/day: 1    Types: Cigarettes    Quit date: 10/06/2017    Years since quitting: 5.0   Smokeless tobacco: Never  Vaping Use   Vaping Use: Every day    Substances: Nicotine  Substance and Sexual Activity   Alcohol use: Yes    Comment: occ   Drug use: Yes    Types: Marijuana    Comment: weekly . 1 joint   Sexual activity: Not Currently    Birth control/protection: None, Surgical  Other Topics Concern   Not on file  Social History Narrative      Separated from husband , 05/2020    Has 3 children      [employer]         Social Determinants of Health   Financial Resource Strain: Medium Risk (08/25/2019)   Overall Financial Resource Strain (CARDIA)    Difficulty of Paying Living Expenses: Somewhat hard  Food Insecurity: Not on file  Transportation Needs: Not on file  Physical Activity: Not on file  Stress: Not on file  Social Connections: Not on file    Allergies: No Known Allergies  Metabolic Disorder Labs: Lab Results  Component Value Date   HGBA1C 5.4 07/27/2021   MPG 100 09/26/2019   Lab Results  Component Value Date   PROLACTIN 28.9 08/17/2021   PROLACTIN 5.1 07/27/2021   Lab Results  Component Value Date   CHOL 260 (H) 09/26/2019   TRIG 121 09/26/2019   HDL 68 09/26/2019   CHOLHDL 3.8 09/26/2019   LDLCALC 167 (H) 09/26/2019   Lab Results  Component Value Date   TSH 3.074 05/22/2022   TSH 13.39 (H) 05/15/2022    Therapeutic Level Labs: No results found for: "LITHIUM" Lab Results  Component Value Date   VALPROATE 45 (L) 10/26/2022   No results found for: "CBMZ"  Current Medications: Current Outpatient Medications  Medication Sig Dispense Refill   divalproex (DEPAKOTE) 500 MG DR tablet Take 1 tablet (500 mg total) by mouth 2 (two) times daily. 60 tablet 1   FLUoxetine (PROZAC) 20 MG capsule Take 1 capsule (20 mg total) by mouth daily. 90 capsule 3   FLUoxetine (PROZAC) 40 MG capsule TAKE 1 CAPSULE (40 MG TOTAL) BY MOUTH EVERY MORNING. 90 capsule 4   SUMAtriptan (IMITREX) 25 MG tablet TAKE 1 TAB BY MOUTH DAILY. MAY REPEAT ONCE 2 HRS LATER IF HEADACHE PERSISTS OR RECURS. MAX 2 DOSES. 9  tablet 2   SYNTHROID 150 MCG tablet Take 1 tablet (150 mcg total) by mouth daily before breakfast. (Patient taking differently: Take 175 mcg by mouth daily before breakfast.) 90 tablet 3   valACYclovir (VALTREX) 500 MG tablet TAKE 500 MG BY MOUTH X EVERY 12 HOURS X 3 DAYS. ASAP W/IN 24H OF SYMPTOMS ONSET. 18 tablet 2   clonazePAM (KLONOPIN) 0.5 MG tablet Take 1 tablet (0.5 mg total) by mouth 2 (  two) times daily for 7 days. 14 tablet 0   clonazePAM (KLONOPIN) 0.5 MG tablet Take 1.5 tablets (0.75 mg total) by mouth as directed for 7 days. Take one tablet daily morning and half tablet daily at bedtime 15 tablet 0   clonazePAM (KLONOPIN) 0.5 MG tablet Take 0.5 tablets (0.25 mg total) by mouth daily for 22 days. 11 tablet 0   No current facility-administered medications for this visit.     Musculoskeletal: Strength & Muscle Tone: within normal limits Gait & Station: normal Patient leans: N/A  Psychiatric Specialty Exam: Review of Systems  Psychiatric/Behavioral:  Positive for dysphoric mood and sleep disturbance. The patient is nervous/anxious.   All other systems reviewed and are negative.   Blood pressure 100/66, pulse 69, temperature 98 F (36.7 C), temperature source Skin, height 5' 2.6" (1.59 m), weight 106 lb 3.2 oz (48.2 kg).Body mass index is 19.05 kg/m.  General Appearance: Casual  Eye Contact:  Fair  Speech:  Normal Rate  Volume:  Normal  Mood:  Anxious and Depressed  Affect:  Appropriate  Thought Process:  Goal Directed and Descriptions of Associations: Intact  Orientation:  Full (Time, Place, and Person)  Thought Content: Logical   Suicidal Thoughts:  No  Homicidal Thoughts:  No  Memory:  Immediate;   Fair Recent;   Fair Remote;   Fair  Judgement:  Fair  Insight:  Fair  Psychomotor Activity:  Normal  Concentration:  Concentration: Fair and Attention Span: Fair  Recall:  [employer] of Knowledge: Fair  Language: Fair  Akathisia:  No  Handed:  Right  AIMS (if  indicated): not done  Assets:  Communication Skills Desire for Improvement Housing Social Support  ADL's:  Intact  Cognition: WNL  Sleep:  Poor   Screenings: GAD-7    Flowsheet Row Office Visit from 10/26/2022 in [facility] Office Visit from 09/26/2022 in [facility] Office Visit from 02/15/2022 in [facility] [employer] Visit from 08[facility]24/2021 in [facility] [employer] Visit from 07/27/2021 in [facility] [employer]  Total GAD-7 Score 0 21 0 1 6      PHQ2-9    Flowsheet Row Office Visit from 10/26/2022 in [facility] Office Visit from 09/26/2022 in [facility] Office Visit from 05/29/2022 in [facility] Visit from 02/15/2022 in [facility] [employer] Visit from 08/24/2021 in [facility] [employer]  PHQ-2 Total Score 6 6 0 0 2  PHQ-9 Total Score 19 26 -- -- 2      Flowsheet Row Office Visit from 10/26/2022 in [facility] Office Visit from 09/26/2022 in [facility] ED from 05/22/2022 in [facility] Emergency Department at [facility]  C-SSRS RISK CATEGORY Low Risk Low Risk No Risk        Assessment and Plan: Rachel Alexander is a 42 year old Caucasian female, legally separated, lives in [place], unemployed, has a history of bipolar disorder, anxiety, hypothyroidism, migraine headaches was evaluated in office today.  Patient with good response to Depakote although continues to have mood symptoms has been making progress, will benefit from the following plan.  Plan Bipolar disorder type I mixed moderate-improving Increase Depakote to 500 mg p.o.  twice daily Depakote level reviewed and discussed with  patient-dated 10/26/2022-45-subtherapeutic.  PTSD-improving Continue Prozac 60 mg p.o. daily.  Will consider reducing the dosage or changing to another SSRI or SNRI in the future as needed. Patient was referred for CBT-pending.  Patient encouraged to establish care with therapist.  Long-term current use of benzodiazepine-improving Will reduce clonazepam to 0.25 mg daily for the next 10 days and 0.25 mg every other day for another week and stop using it.  Patient could consider using it as needed after that if she has any significant anxiety or withdrawal symptoms.  Patient to monitor for severe withdrawal symptoms and to seek immediate help. Reviewed [place] PMP AWARxE  High risk medication use-I have reviewed and discussed the following labs including valproic acid-45-5 /9//2024-subtherapeutic, Platelet count-within normal limits, sodium-136-within normal limits, hepatic function panel-within normal limits, urine drug screen-pending. Since Depakote dosage has been increased patient advised to go to lab 5 days after starting the higher dosage to repeat Depakote level.  Order is in the system for [facility] lab.  Attention and concentration deficit-I will refer patient for ADHD testing.  Patient to be referred to [place] attention specialist.  Follow-up in clinic in 4 to 6 weeks or sooner if needed.  Collaboration of Care: Collaboration of Care: Referral or follow-up with counselor/therapist AEB patient encouraged to establish care with therapist.  Provided resources.  Patient/Guardian was advised Release of Information must be obtained prior to any record release in order to collaborate their care with an outside provider. Patient/Guardian was advised if they have not already done so to contact the registration department to sign all necessary forms in order for [place] to release information regarding their care.   Consent: Patient/Guardian gives verbal  consent for treatment and assignment of benefits for services provided during this visit. Patient/Guardian expressed understanding and agreed to proceed.   I have spent atleast 40 minutes face to face with patient today which includes the time spent for preparing to see the patient ( e.g., review of test, records ), obtaining and to review and separately obtained history , ordering medications and test ,psychoeducation and supportive psychotherapy and care coordination,as well as documenting clinical information in electronic health record,interpreting and communication of test results.   This note was generated in part or whole with voice recognition software. Voice recognition is usually quite accurate but there are transcription errors that can and very often do occur. I apologize for any typographical errors that were not detected and corrected.    Jomarie Longs, MD 10/27/2022, 1:01 PM

## 2022-11-01 LAB — URINE DRUGS OF ABUSE SCREEN W ALC, ROUTINE (REF LAB)
Amphetamines, Urine: NEGATIVE ng/mL
Barbiturate, Ur: NEGATIVE ng/mL
Benzodiazepine Quant, Ur: NEGATIVE ng/mL
Cocaine (Metab.): NEGATIVE ng/mL
Ethanol U, Quan: NEGATIVE %
Methadone Screen, Urine: NEGATIVE ng/mL
Opiate Quant, Ur: NEGATIVE ng/mL
Phencyclidine, Ur: NEGATIVE ng/mL
Propoxyphene, Urine: NEGATIVE ng/mL

## 2022-11-01 LAB — PANEL 799049
CARBOXY THC GC/MS CONF: >750 ng/mL
Cannabinoid GC/MS, Ur: POSITIVE — AB

## 2022-11-17 ENCOUNTER — Other Ambulatory Visit: Payer: Self-pay | Admitting: Psychiatry

## 2022-11-17 DIAGNOSIS — F3162 Bipolar disorder, current episode mixed, moderate: Secondary | ICD-10-CM

## 2022-11-20 ENCOUNTER — Ambulatory Visit (INDEPENDENT_AMBULATORY_CARE_PROVIDER_SITE_OTHER): Payer: 59 | Admitting: Family

## 2022-11-20 ENCOUNTER — Telehealth: Payer: Self-pay | Admitting: Psychiatry

## 2022-11-20 ENCOUNTER — Encounter: Payer: Self-pay | Admitting: Family

## 2022-11-20 VITALS — BP 118/70 | HR 75 | Temp 97.8°F | Ht 62.0 in | Wt 111.2 lb

## 2022-11-20 DIAGNOSIS — R399 Unspecified symptoms and signs involving the genitourinary system: Secondary | ICD-10-CM

## 2022-11-20 DIAGNOSIS — R300 Dysuria: Secondary | ICD-10-CM | POA: Diagnosis not present

## 2022-11-20 DIAGNOSIS — F32A Depression, unspecified: Secondary | ICD-10-CM

## 2022-11-20 DIAGNOSIS — F419 Anxiety disorder, unspecified: Secondary | ICD-10-CM

## 2022-11-20 LAB — POCT URINALYSIS DIPSTICK
Bilirubin, UA: NEGATIVE
Glucose, UA: NEGATIVE
Ketones, UA: NEGATIVE
Nitrite, UA: NEGATIVE
Protein, UA: NEGATIVE
Spec Grav, UA: 1.02 (ref 1.010–1.025)
Urobilinogen, UA: 0.2 E.U./dL
pH, UA: 6.5 (ref 5.0–8.0)

## 2022-11-20 MED ORDER — NITROFURANTOIN MONOHYD MACRO 100 MG PO CAPS: 100.0000 mg | ORAL_CAPSULE | Freq: Two times a day (BID) | ORAL | 0 refills | Status: DC

## 2022-11-20 MED ORDER — FLUCONAZOLE 150 MG PO TABS: 150.0000 mg | ORAL_TABLET | Freq: Once | ORAL | 1 refills | Status: AC

## 2022-11-20 NOTE — Telephone Encounter (Signed)
Patient was called per Dr Elna Breslow to schedule a follow up appointment. Dr. Elna Breslow previously spoke with patient's PCP and was advised to schedule a follow up appointment. When asked to schedule a follow up, patient declined and stated she was "feeling fine" and did not need an appointment

## 2022-11-20 NOTE — Progress Notes (Unsigned)
Assessment & Plan:  Dysuria Assessment & Plan: Urine is positive for red blood cells, white blood cells.  Duration of symptoms 2 weeks.  Concern for UTI.  Patient will self swab to also screen for yeast infection, BV, chlamydia gonorrhea or trichomonas.  She declines serum blood work for comprehensive screen for STDs.  We agreed to start Macrobid ahead of urine culture.  Advised probiotics  Orders: -     Wet prep  (BD Affirm) ([place]) -     Nitrofurantoin Monohyd Macro; Take 1 capsule (100 mg total) by mouth 2 (two) times daily. Take with food.  Dispense: 10 capsule; Refill: 0 -     Fluconazole; Take 1 tablet (150 mg total) by mouth once for 1 dose. Take one tablet PO once. If sxs persist, may take one tablet PO 3 days later.  Dispense: 2 tablet; Refill: 1  UTI symptoms -     Urinalysis, Routine w reflex microscopic -     Urine Culture -     POCT urinalysis dipstick  Anxiety and depression Assessment & Plan: Uncontrolled. Reviewed Dr. Elvera Maria previous note.  We had a  long discussion with patient in regards to passive thoughts "not being here".  She adamantly denies any suicide plan.  She looks forward to seeing her children grow up.  She is looking for new employment.  She doesn't feel she needs inpatient therapy or intensive day therapy at this time.  She will reach out to therapist which Dr. Elna Breslow recommended.  She states she will go have lab drawn tomorrow for valproic acid.      Return precautions given.   Risks, benefits, and alternatives of the medications and treatment plan prescribed today were discussed, and patient expressed understanding.   Education regarding symptom management and diagnosis given to patient on AVS either electronically or printed.  Return in about 1 month (around 12/20/2022).  Rennie Plowman, FNP  Subjective:    Patient ID: Rachel Alexander, female    DOB: Nov 09, 1980, 42 y.o.   MRN: [id number]  CC: Rachel Alexander is a 42 y.o. female who  presents today for an acute visit.    HPI: She complains of dysuria, vaginal itching x 2 weeks  Brown vaginal discharge.   No pelvic pain, flank pain, fever, chills.   Tried monistat for vaginal itching without relief  No recent antibiotic.   She is not on her menses  No h/o renal stone.    She describes increased depression over the past several weeks she voluntarily left her job.  She does feel Depakote has been helpful and her moods are "more stable".    she was very happy at work but left for personal reasons due to a romantic relationship developed over the past 2 years and recently he ended at work.  She is still 'getting over' this romantic relationship.  She enjoys working and is currently looking for a job.  She had an interview last week.  She has passive thoughts of not being here. she lives for her 3 children whom she lives and looks forward to visiting.  She denies a suicide plan.  10 years ago she did attempt suicide.  She thinks counseling would be helpful.  Denies thoughts of hurting anyone else She has completely tapered off the klonopin.    Follow-up Dr Elna Breslow 10/26/2022.  She started on Depakote.  Compliant with clonazepam half tablet daily.  Agrees with further tapering of clonazepam. Increased Depakote to 500 mg twice daily.  Continued on Prozac 60 mg daily referred to [place] attention specialist for ADHD testing  Valproic acid level not obtained from 10/26/2022   Allergies: Patient has no known allergies. Current Outpatient Medications on File Prior to Visit  Medication Sig Dispense Refill   divalproex (DEPAKOTE) 500 MG DR tablet TAKE 1 TABLET BY MOUTH TWICE A DAY 180 tablet 0   FLUoxetine (PROZAC) 20 MG capsule Take 1 capsule (20 mg total) by mouth daily. 90 capsule 3   FLUoxetine (PROZAC) 40 MG capsule TAKE 1 CAPSULE (40 MG TOTAL) BY MOUTH EVERY MORNING. 90 capsule 4   SUMAtriptan (IMITREX) 25 MG tablet TAKE 1 TAB BY MOUTH DAILY. MAY REPEAT ONCE 2 HRS  LATER IF HEADACHE PERSISTS OR RECURS. MAX 2 DOSES. 9 tablet 2   SYNTHROID 150 MCG tablet Take 1 tablet (150 mcg total) by mouth daily before breakfast. (Patient taking differently: Take 175 mcg by mouth daily before breakfast.) 90 tablet 3   valACYclovir (VALTREX) 500 MG tablet TAKE 500 MG BY MOUTH X EVERY 12 HOURS X 3 DAYS. ASAP W/IN 24H OF SYMPTOMS ONSET. 18 tablet 2   [DISCONTINUED] dicyclomine (BENTYL) 20 MG tablet Take 1 tablet (20 mg total) by mouth 2 (two) times daily. 20 tablet 0   No current facility-administered medications on file prior to visit.    Review of Systems  Constitutional:  Negative for chills and fever.  Respiratory:  Negative for cough.   Cardiovascular:  Negative for chest pain and palpitations.  Gastrointestinal:  Negative for nausea and vomiting.  Genitourinary:  Positive for dysuria and vaginal discharge. Negative for flank pain and vaginal bleeding.  Psychiatric/Behavioral:  The patient is not nervous/anxious.       Objective:    BP 118/70   Pulse 75   Temp 97.8 F (36.6 C) (Oral)   Ht 5\' 2"  (1.575 m)   Wt 111 lb 3.2 oz (50.4 kg)   SpO2 99%   BMI 20.34 kg/m   BP Readings from Last 3 Encounters:  11/20/22 118/70  10/26/22 100/66  09/26/22 107/72   Wt Readings from Last 3 Encounters:  11/20/22 111 lb 3.2 oz (50.4 kg)  10/26/22 106 lb 3.2 oz (48.2 kg)  09/26/22 109 lb 6.4 oz (49.6 kg)    Physical Exam Vitals reviewed.  Constitutional:      Appearance: She is well-developed.  Cardiovascular:     Rate and Rhythm: Normal rate and regular rhythm.     Pulses: Normal pulses.     Heart sounds: Normal heart sounds.  Pulmonary:     Effort: Pulmonary effort is normal.     Breath sounds: Normal breath sounds. No wheezing, rhonchi or rales.  Skin:    General: Skin is warm and dry.  Neurological:     Mental Status: She is alert.  Psychiatric:        Speech: Speech normal.        Behavior: Behavior normal.        Thought Content: Thought content  normal.

## 2022-11-20 NOTE — Telephone Encounter (Signed)
Received a message from primary care provider that patient today had a visit with her for dysuria and wanted to coordinate care since patient mentioned ' thoughts of not wanting to wake up sometimes' although she denied any active suicidal thoughts or plan.  Called primary care provider back, we discussed a referral for IOP.  Discussed that our staff already reached out to patient for an appointment slot for tomorrow with this provider.  Patient however reported she was ' fine' and did not want to take that appointment.  Will have CMA contact patient again  to make sure she gets her Depakote levels completed and also schedule an appointment sooner if needed.

## 2022-11-20 NOTE — Patient Instructions (Addendum)
Please ensure that you continue to follow closely with Dr. Elna Breslow and reach out to her therapist whom she recommended .  You are overdue for valproic acid level.  Please go to [facility] tomorrow to have this checked.   start Macrobid, antibiotic for suspected urinary tract infection  Ensure to take probiotics while on antibiotics and also for 2 weeks after completion. This can either be by eating yogurt daily or taking a probiotic supplement over the counter such as Culturelle.It is important to re-colonize the gut with good bacteria and also to prevent any diarrheal infections associated with antibiotic use.    If you have unusual thoughts, thoughts of hurting yourself, or anyone else, please go immediately to the emergency department.   Please text to [phone] and write the word 'home'. This will put you in touch with trained crisis counselor and resources.    National Suicide Prevention Hotline - available 24 hours a day, 7 days a week.  [phone]  Major Depressive Disorder Major depressive disorder is a mental illness. It also may be called clinical depression or unipolar depression. Major depressive disorder usually causes feelings of sadness, hopelessness, or helplessness. Some people with this disorder do not feel particularly sad but lose interest in doing things they used to enjoy (anhedonia). Major depressive disorder also can cause physical symptoms. It can interfere with work, school, relationships, and other normal everyday activities. The disorder varies in severity but is longer lasting and more serious than the sadness we all feel from time to time in our lives. Major depressive disorder often is triggered by stressful life events or major life changes. Examples of these triggers include divorce, loss of your job or home, a move, and the death of a family member or close friend. Sometimes this disorder occurs for no obvious reason at all. People who have family members with major  depressive disorder or bipolar disorder are at higher risk for developing this disorder, with or without life stressors. Major depressive disorder can occur at any age. It may occur just once in your life (single episode major depressive disorder). It may occur multiple times (recurrent major depressive disorder). SYMPTOMS People with major depressive disorder have either anhedonia or depressed mood on nearly a daily basis for at least 2 weeks or longer. Symptoms of depressed mood include: Feelings of sadness (blue or down in the dumps) or emptiness. Feelings of hopelessness or helplessness. Tearfulness or episodes of crying (may be observed by others). Irritability (children and adolescents). In addition to depressed mood or anhedonia or both, people with this disorder have at least four of the following symptoms: Difficulty sleeping or sleeping too much.   Significant change (increase or decrease) in appetite or weight.   Lack of energy or motivation. Feelings of guilt and worthlessness.   Difficulty concentrating, remembering, or making decisions. Unusually slow movement (psychomotor retardation) or restlessness (as observed by others).   Recurrent wishes for death, recurrent thoughts of self-harm (suicide), or a suicide attempt. People with major depressive disorder commonly have persistent negative thoughts about themselves, other people, and the world. People with severe major depressive disorder may experience distorted beliefs or perceptions about the world (psychotic delusions). They also may see or hear things that are not real (psychotic hallucinations). DIAGNOSIS Major depressive disorder is diagnosed through an assessment by your health care provider. Your health care provider will ask about aspects of your daily life, such as mood, sleep, and appetite, to see if you have the diagnostic symptoms  of major depressive disorder. Your health care provider may ask about your medical history  and use of alcohol or drugs, including prescription medicines. Your health care provider also may do a physical exam and blood work. This is because certain medical conditions and the use of certain substances can cause major depressive disorder-like symptoms (secondary depression). Your health care provider also may refer you to a mental health specialist for further evaluation and treatment. TREATMENT It is important to recognize the symptoms of major depressive disorder and seek treatment. The following treatments can be prescribed for this disorder:   Medicine. Antidepressant medicines usually are prescribed. Antidepressant medicines are thought to correct chemical imbalances in the brain that are commonly associated with major depressive disorder. Other types of medicine may be added if the symptoms do not respond to antidepressant medicines alone or if psychotic delusions or hallucinations occur. Talk therapy. Talk therapy can be helpful in treating major depressive disorder by providing support, education, and guidance. Certain types of talk therapy also can help with negative thinking (cognitive behavioral therapy) and with relationship issues that trigger this disorder (interpersonal therapy). A mental health specialist can help determine which treatment is best for you. Most people with major depressive disorder do well with a combination of medicine and talk therapy. Treatments involving electrical stimulation of the brain can be used in situations with extremely severe symptoms or when medicine and talk therapy do not work over time. These treatments include electroconvulsive therapy, transcranial magnetic stimulation, and vagal nerve stimulation.   This information is not intended to replace advice given to you by your health care provider. Make sure you discuss any questions you have with your health care provider.   Document Released: 09/30/2012 Document Revised: 06/26/2014 Document Reviewed:  09/30/2012 Elsevier Interactive Patient Education [employer]

## 2022-11-20 NOTE — Assessment & Plan Note (Addendum)
Uncontrolled. Reviewed Dr. Elvera Maria previous note.  We had a  long discussion with patient in regards to passive thoughts "not being here".  She adamantly denies any suicide plan.  She looks forward to seeing her children grow up.  She is looking for new employment.  She doesn't feel she needs inpatient therapy or intensive day therapy at this time.  She will reach out to therapist which Dr. Elna Breslow recommended.  She states she will go have lab drawn tomorrow for valproic acid.

## 2022-11-20 NOTE — Telephone Encounter (Signed)
Noted, thank you

## 2022-11-20 NOTE — Assessment & Plan Note (Signed)
Urine is positive for red blood cells, white blood cells.  Duration of symptoms 2 weeks.  Concern for UTI.  Patient will self swab to also screen for yeast infection, BV, chlamydia gonorrhea or trichomonas.  She declines serum blood work for comprehensive screen for STDs.  We agreed to start Macrobid ahead of urine culture.  Advised probiotics

## 2022-11-21 ENCOUNTER — Other Ambulatory Visit (HOSPITAL_COMMUNITY)
Admission: RE | Admit: 2022-11-21 | Discharge: 2022-11-21 | Disposition: A | Discharge status: Home / Self Care | Payer: 59 | Source: Ambulatory Visit | Attending: Family | Admitting: Family

## 2022-11-21 ENCOUNTER — Telehealth (HOSPITAL_COMMUNITY): Payer: Self-pay | Admitting: Psychiatry

## 2022-11-21 ENCOUNTER — Telehealth: Payer: Self-pay | Admitting: Family

## 2022-11-21 DIAGNOSIS — R300 Dysuria: Secondary | ICD-10-CM | POA: Insufficient documentation

## 2022-11-21 LAB — URINALYSIS, ROUTINE W REFLEX MICROSCOPIC
Bilirubin Urine: NEGATIVE
Ketones, ur: NEGATIVE
Nitrite: NEGATIVE
Specific Gravity, Urine: 1.02 (ref 1.000–1.030)
Total Protein, Urine: NEGATIVE
Urine Glucose: NEGATIVE
Urobilinogen, UA: 0.2 (ref 0.0–1.0)
pH: 6.5 (ref 5.0–8.0)

## 2022-11-21 NOTE — Telephone Encounter (Signed)
Rachel Alexander, [phone]. Lab was ordered wrong. It is not a wet prep. Please put correct orders in for the swab.

## 2022-11-21 NOTE — Telephone Encounter (Signed)
D:  Received a referral from Rennie Plowman, FNP and Dr. Elna Breslow.  A:  Placed call and oriented pt to virtual MH-IOP.  Scheduled CCA for tomorrow at 1 pm and pt will start MH-IOP on 11-23-22 @ 9a.m.  Inform Margaret.  Encouraged pt to verify her insurance benefits.  R:  Pt receptive.

## 2022-11-21 NOTE — Telephone Encounter (Signed)
Spoke to [place]  and orders were corrected

## 2022-11-22 ENCOUNTER — Other Ambulatory Visit (HOSPITAL_COMMUNITY): Payer: 59 | Attending: Psychiatry | Admitting: Psychiatry

## 2022-11-22 ENCOUNTER — Other Ambulatory Visit: Payer: Self-pay | Admitting: Family

## 2022-11-22 DIAGNOSIS — A599 Trichomoniasis, unspecified: Secondary | ICD-10-CM

## 2022-11-22 DIAGNOSIS — B9689 Other specified bacterial agents as the cause of diseases classified elsewhere: Secondary | ICD-10-CM

## 2022-11-22 LAB — CERVICOVAGINAL ANCILLARY ONLY
Bacterial Vaginitis (gardnerella): POSITIVE — AB
Candida Glabrata: NEGATIVE
Candida Vaginitis: POSITIVE — AB
Chlamydia: NEGATIVE
Comment: NEGATIVE
Comment: NEGATIVE
Comment: NEGATIVE
Comment: NEGATIVE
Comment: NEGATIVE
Comment: NORMAL
Neisseria Gonorrhea: NEGATIVE
Trichomonas: POSITIVE — AB

## 2022-11-22 LAB — URINE CULTURE
MICRO NUMBER:: 15032978
SPECIMEN QUALITY:: ADEQUATE

## 2022-11-22 MED ORDER — METRONIDAZOLE 500 MG PO TABS: 500.0000 mg | ORAL_TABLET | Freq: Two times a day (BID) | ORAL | 0 refills | Status: AC
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
## 2022-11-23 ENCOUNTER — Telehealth (HOSPITAL_COMMUNITY): Payer: Self-pay | Admitting: Psychiatry

## 2022-11-23 ENCOUNTER — Ambulatory Visit (HOSPITAL_COMMUNITY): Payer: Self-pay | Admitting: Psychiatry

## 2022-11-23 ENCOUNTER — Other Ambulatory Visit: Payer: Self-pay

## 2022-11-23 ENCOUNTER — Telehealth: Payer: Self-pay

## 2022-11-23 DIAGNOSIS — N898 Other specified noninflammatory disorders of vagina: Secondary | ICD-10-CM

## 2022-11-23 NOTE — Telephone Encounter (Signed)
I tried again today but I had to leave another message with directions and also ask that she call office back.

## 2022-11-23 NOTE — Telephone Encounter (Signed)
LVM to call back to office  

## 2022-11-24 ENCOUNTER — Other Ambulatory Visit (HOSPITAL_COMMUNITY): Payer: Self-pay

## 2022-11-24 ENCOUNTER — Telehealth (HOSPITAL_COMMUNITY): Payer: Self-pay | Admitting: Psychiatry

## 2022-11-27 ENCOUNTER — Encounter: Payer: Self-pay | Admitting: Family

## 2022-11-27 ENCOUNTER — Ambulatory Visit (HOSPITAL_COMMUNITY): Payer: Self-pay

## 2022-11-28 ENCOUNTER — Ambulatory Visit (HOSPITAL_COMMUNITY): Payer: Self-pay

## 2022-11-28 NOTE — Telephone Encounter (Signed)
LVM to call back to office  

## 2022-11-29 ENCOUNTER — Ambulatory Visit (HOSPITAL_COMMUNITY): Payer: Self-pay

## 2022-11-30 ENCOUNTER — Ambulatory Visit (HOSPITAL_COMMUNITY): Payer: Self-pay

## 2022-11-30 NOTE — Telephone Encounter (Signed)
[facility] called in stating that they need clarification on antibiotics. It says 2x a day for 7 days, but its only 7 quantities.Marland Kitchen?? Their called back its [phone].

## 2022-12-01 ENCOUNTER — Ambulatory Visit (HOSPITAL_COMMUNITY): Payer: Self-pay

## 2022-12-01 ENCOUNTER — Telehealth: Payer: Self-pay

## 2022-12-01 NOTE — Telephone Encounter (Signed)
LVM to call back to office  

## 2022-12-04 ENCOUNTER — Ambulatory Visit (HOSPITAL_COMMUNITY): Payer: Self-pay

## 2022-12-05 ENCOUNTER — Other Ambulatory Visit: Payer: Self-pay | Admitting: Family

## 2022-12-05 ENCOUNTER — Ambulatory Visit (HOSPITAL_COMMUNITY): Payer: Self-pay

## 2022-12-05 DIAGNOSIS — A599 Trichomoniasis, unspecified: Secondary | ICD-10-CM

## 2022-12-05 MED ORDER — METRONIDAZOLE 500 MG PO TABS
500.0000 mg | ORAL_TABLET | Freq: Two times a day (BID) | ORAL | 0 refills | Status: AC
Start: 2022-12-05 — End: 2022-12-12

## 2022-12-05 MED ORDER — METRONIDAZOLE 500 MG PO TABS
500.0000 mg | ORAL_TABLET | Freq: Two times a day (BID) | ORAL | 0 refills | Status: DC
Start: 2022-12-05 — End: 2022-12-05

## 2022-12-06 ENCOUNTER — Ambulatory Visit (HOSPITAL_COMMUNITY): Payer: Self-pay

## 2022-12-07 ENCOUNTER — Ambulatory Visit (HOSPITAL_COMMUNITY): Payer: Self-pay

## 2022-12-08 ENCOUNTER — Ambulatory Visit (HOSPITAL_COMMUNITY): Payer: Self-pay

## 2022-12-11 ENCOUNTER — Ambulatory Visit (HOSPITAL_COMMUNITY): Payer: Self-pay

## 2022-12-11 ENCOUNTER — Telehealth: Payer: 59 | Admitting: Psychiatry

## 2022-12-12 ENCOUNTER — Ambulatory Visit (HOSPITAL_COMMUNITY): Payer: Self-pay

## 2022-12-13 ENCOUNTER — Ambulatory Visit (HOSPITAL_COMMUNITY): Payer: Self-pay

## 2022-12-14 ENCOUNTER — Ambulatory Visit (HOSPITAL_COMMUNITY): Payer: Self-pay

## 2022-12-15 ENCOUNTER — Ambulatory Visit (HOSPITAL_COMMUNITY): Payer: Self-pay

## 2022-12-18 ENCOUNTER — Ambulatory Visit (HOSPITAL_COMMUNITY): Payer: Self-pay

## 2022-12-19 ENCOUNTER — Ambulatory Visit (HOSPITAL_COMMUNITY): Payer: Self-pay

## 2022-12-20 ENCOUNTER — Ambulatory Visit (HOSPITAL_COMMUNITY): Payer: Self-pay

## 2022-12-22 ENCOUNTER — Ambulatory Visit (HOSPITAL_COMMUNITY): Payer: Self-pay

## 2022-12-25 ENCOUNTER — Ambulatory Visit (HOSPITAL_COMMUNITY): Payer: Self-pay

## 2022-12-26 ENCOUNTER — Ambulatory Visit (HOSPITAL_COMMUNITY): Payer: Self-pay

## 2022-12-27 ENCOUNTER — Ambulatory Visit (HOSPITAL_COMMUNITY): Payer: Self-pay

## 2022-12-28 ENCOUNTER — Ambulatory Visit (HOSPITAL_COMMUNITY): Payer: Self-pay

## 2022-12-29 ENCOUNTER — Ambulatory Visit (HOSPITAL_COMMUNITY): Payer: Self-pay

## 2023-01-01 ENCOUNTER — Ambulatory Visit (HOSPITAL_COMMUNITY): Payer: Self-pay

## 2023-01-02 ENCOUNTER — Ambulatory Visit (HOSPITAL_COMMUNITY): Payer: Self-pay

## 2023-01-03 ENCOUNTER — Ambulatory Visit (HOSPITAL_COMMUNITY): Payer: Self-pay

## 2023-01-04 ENCOUNTER — Ambulatory Visit (HOSPITAL_COMMUNITY): Payer: Self-pay

## 2023-01-05 ENCOUNTER — Ambulatory Visit (HOSPITAL_COMMUNITY): Payer: Self-pay

## 2023-01-08 ENCOUNTER — Ambulatory Visit (HOSPITAL_COMMUNITY): Payer: Self-pay

## 2023-01-09 ENCOUNTER — Telehealth (INDEPENDENT_AMBULATORY_CARE_PROVIDER_SITE_OTHER): Payer: 59 | Admitting: Psychiatry

## 2023-01-09 ENCOUNTER — Ambulatory Visit (HOSPITAL_COMMUNITY): Payer: Self-pay

## 2023-01-09 DIAGNOSIS — Z91199 Patient's noncompliance with other medical treatment and regimen due to unspecified reason: Secondary | ICD-10-CM

## 2023-01-09 NOTE — Progress Notes (Signed)
No response to call or text or video invite  

## 2023-01-10 ENCOUNTER — Ambulatory Visit (HOSPITAL_COMMUNITY): Payer: Self-pay

## 2023-01-11 ENCOUNTER — Ambulatory Visit (HOSPITAL_COMMUNITY): Payer: Self-pay

## 2023-01-12 ENCOUNTER — Ambulatory Visit (HOSPITAL_COMMUNITY): Payer: Self-pay

## 2023-01-15 ENCOUNTER — Ambulatory Visit (HOSPITAL_COMMUNITY): Payer: Self-pay

## 2023-01-15 ENCOUNTER — Other Ambulatory Visit: Payer: Self-pay | Admitting: Family

## 2023-01-15 DIAGNOSIS — R519 Headache, unspecified: Secondary | ICD-10-CM

## 2023-01-16 ENCOUNTER — Ambulatory Visit (HOSPITAL_COMMUNITY): Payer: Self-pay

## 2023-01-17 ENCOUNTER — Ambulatory Visit (HOSPITAL_COMMUNITY): Payer: Self-pay

## 2023-01-18 ENCOUNTER — Ambulatory Visit (HOSPITAL_COMMUNITY): Payer: Self-pay

## 2023-01-19 ENCOUNTER — Ambulatory Visit (HOSPITAL_COMMUNITY): Payer: Self-pay

## 2023-02-01 ENCOUNTER — Encounter: Payer: Self-pay | Admitting: Family

## 2023-03-13 ENCOUNTER — Other Ambulatory Visit
Admission: RE | Admit: 2023-03-13 | Discharge: 2023-03-13 | Disposition: A | Discharge status: Home / Self Care | Payer: 59 | Attending: Family | Admitting: Family

## 2023-03-13 DIAGNOSIS — F3162 Bipolar disorder, current episode mixed, moderate: Secondary | ICD-10-CM | POA: Insufficient documentation

## 2023-03-13 DIAGNOSIS — Z79899 Other long term (current) drug therapy: Secondary | ICD-10-CM | POA: Diagnosis not present

## 2023-03-13 DIAGNOSIS — E039 Hypothyroidism, unspecified: Secondary | ICD-10-CM | POA: Diagnosis not present

## 2023-03-13 DIAGNOSIS — N898 Other specified noninflammatory disorders of vagina: Secondary | ICD-10-CM

## 2023-03-13 LAB — TSH: TSH: 143.969 u[IU]/mL — ABNORMAL HIGH (ref 0.350–4.500)

## 2023-03-13 LAB — VALPROIC ACID LEVEL: Valproic Acid Lvl: <10 ug/mL — ABNORMAL LOW (ref 50.0–100.0)

## 2023-03-13 LAB — T4, FREE: Free T4: 0.68 ng/dL (ref 0.61–1.12)

## 2023-03-13 NOTE — Addendum Note (Signed)
Addended by: Esperanza Sheets on: 03/13/2023 04:20 PM   Modules accepted: Orders

## 2023-03-13 NOTE — Addendum Note (Signed)
Addended by: Esperanza Sheets on: 03/13/2023 04:25 PM   Modules accepted: Orders

## 2023-03-14 ENCOUNTER — Ambulatory Visit: Payer: BC Managed Care – PPO | Admitting: Internal Medicine

## 2023-03-14 NOTE — Progress Notes (Deleted)
Name: Rachel Alexander  MRN/ DOB: [id number], 09-06-1980    Age/ Sex: 42 y.o., female     PCP: Allegra Grana, FNP   Reason for Endocrinology Evaluation: Hypothyroidism     Initial Endocrinology Clinic Visit: 10/09/2019    PATIENT IDENTIFIER: Rachel Alexander is a 42 y.o., female with a past medical history of Depression, anxiety and hypothyroidism.  She has followed with [facility] since 10/09/2019 for consultative assistance with management of her hypothyroidism      HISTORICAL SUMMARY:  Pt was diagnosed with hyperthyroidism and goiter at age 82, was put on oral medications ( does not recall ) but over the years  she became hypothyroid requiring LT-4 replacement. She was initially on armour thyroid but was switched synthroid  In 08/2019.     Cousins with thyroid disease  The patient has been noted with chronic elevated TSH, despite normal free T4, I have attempted to prescribe liothyronine 04/2022 , which was the only time her TSH normalized, but she developed palpitations requiring ED visit in December 2023  Tirosint became cost prohibitive    SUBJECTIVE:    Today (03/14/2023):  Rachel Alexander is here for a follow up on hypothyroidism .  Patient continues to follow-up with behavioral therapy Weight has been stable  Denies constipation no  diarrhea  She continues with moodiness and fatigue  Has noted right neck soreness last night  She stopped Prilosec  She stopped Collagen     Synthroid 150 mcg  ,1 tab daily       HISTORY:  Past Medical History:  Past Medical History:  Diagnosis Date   Colitis    Depression    Frequent headaches    Hypothyroidism    Hypothyroidism    Migraines    Pituitary tumor    Suicidal intent    Past Surgical History:  Past Surgical History:  Procedure Laterality Date   BREAST CYST ASPIRATION Right 2012   COLONOSCOPY N/A 08/24/2019   Procedure: COLONOSCOPY;  Surgeon: Wyline Mood, MD;  Location: [facility] ENDOSCOPY;   Service: Gastroenterology;  Laterality: N/A;   TONSILLECTOMY     TUBAL LIGATION     Social History:  reports that she quit smoking about 5 years ago. Her smoking use included cigarettes. She has never used smokeless tobacco. She reports current alcohol use. She reports current drug use. Drug: Marijuana. Family History:  Family History  Problem Relation Age of Onset   Drug abuse Mother    Anxiety disorder Mother    Depression Mother    Healthy Mother    Schizophrenia Father    Liver cancer Father 56   ADD / ADHD Sister    Diabetes Sister    Drug abuse Brother    Alcohol abuse Brother    Depression Brother    ADD / ADHD Brother    Breast cancer Maternal Aunt    ADD / ADHD Half-Brother    Bipolar disorder Half-Brother    Schizophrenia Half-Brother      HOME MEDICATIONS: Allergies as of 03/14/2023   No Known Allergies      Medication List        Accurate as of March 14, 2023  7:10 AM. If you have any questions, ask your nurse or doctor.          divalproex 500 MG DR tablet Commonly known as: DEPAKOTE TAKE 1 TABLET BY MOUTH TWICE A DAY   FLUoxetine 20 MG capsule Commonly known as: PROzac Take 1 capsule (20  mg total) by mouth daily.   FLUoxetine 40 MG capsule Commonly known as: PROZAC TAKE 1 CAPSULE (40 MG TOTAL) BY MOUTH EVERY MORNING.   SUMAtriptan 25 MG tablet Commonly known as: IMITREX TAKE 1 TAB BY MOUTH DAILY. MAY REPEAT ONCE 2 HRS LATER IF HEADACHE PERSISTS OR RECURS. MAX 2 DOSES.   Synthroid 150 MCG tablet Generic drug: levothyroxine Take 1 tablet (150 mcg total) by mouth daily before breakfast. What changed: how much to take   valACYclovir 500 MG tablet Commonly known as: VALTREX TAKE 500 MG BY MOUTH X EVERY 12 HOURS X 3 DAYS. ASAP W/IN 24H OF SYMPTOMS ONSET.          OBJECTIVE:   PHYSICAL EXAM: VS: There were no vitals taken for this visit.  EXAM: General: Pt appears well and is in NAD  Neck: General: Supple without  adenopathy. Thyroid: Thyroid size normal.  No goiter or nodules appreciated.  Lungs: Clear with good BS bilat with no rales, rhonchi, or wheezes  Heart: Auscultation: RRR.  Abdomen: Normoactive bowel sounds, soft, nontender, without masses or organomegaly palpable  Extremities:  BL LE: No pretibial edema normal ROM and strength.  Mental Status: Judgment, insight: Intact Orientation: Oriented to time, place, and person Mood and affect: No depression, anxiety, or agitation     DATA REVIEWED:  Latest Reference Range & Units 05/15/22 15:49  TSH 0.35 - 5.50 uIU/mL 13.39 (H)  (H): Data is abnormally high   ASSESSMENT / PLAN / RECOMMENDATIONS:   Hypothyroidism:    -It has been difficult optimizing her TSH level, she assures me compliance and I suspect she inability to absorb LT-4  -We even switch from  Synthroid to Tirosint but TSH remains above goal  - Will start Liothyronine as below    Medications   Continue  Tirosint 150 mcg daily Start Liothyronine 5 mcg daily     F/U in 4 months  Labs in 6 weeks   Addendum: Discussed labs with pt on 02/14/2021 at 1605 Signed electronically by: Lyndle Herrlich, MD  [facility]  [facility] [address] [place] [place], [place] [address] Phone: [phone] FAX: [phone]      CC: Allegra Grana, FNP [address] [place] [address] Phone: [phone]  Fax: [phone]   Return to Endocrinology clinic as below: Future Appointments  Date Time Provider Department Center  03/14/2023 10:30 AM Diania Co, Konrad Dolores, MD [facility] None

## 2023-03-15 ENCOUNTER — Telehealth: Payer: Self-pay | Admitting: Internal Medicine

## 2023-03-15 NOTE — Telephone Encounter (Signed)
Pt missed appointment yesterday, please reschedule ASAP for this week or next week to discuss results.   May offer virtual    Thanks

## 2023-03-15 NOTE — Telephone Encounter (Signed)
LMx1 and sent MyCx1 to schedule for 03/28/2023 at 7:50 AM with Dr. Lonzo Cloud.  Per Dr. Lonzo Cloud visit can be either in-person or virtual.

## 2023-03-16 NOTE — Telephone Encounter (Signed)
LMx1 to call office & reschedule missed appointment.  Per Dr. Lonzo Cloud can be rescheduled to Wednesday, 03/28/2023 at 7:50 AM.

## 2023-03-21 ENCOUNTER — Encounter: Payer: Self-pay | Admitting: Family

## 2023-03-22 ENCOUNTER — Other Ambulatory Visit: Payer: Self-pay | Admitting: Family

## 2023-03-22 DIAGNOSIS — F32A Depression, unspecified: Secondary | ICD-10-CM

## 2023-03-22 MED ORDER — CITALOPRAM HYDROBROMIDE 20 MG PO TABS: 20.0000 mg | ORAL_TABLET | Freq: Every day | ORAL | 1 refills | Status: DC

## 2023-04-19 ENCOUNTER — Ambulatory Visit: Payer: 59 | Admitting: Family

## 2023-04-19 ENCOUNTER — Encounter: Payer: Self-pay | Admitting: Family

## 2023-04-19 VITALS — BP 116/76 | HR 76 | Temp 98.1°F | Ht 62.0 in | Wt 120.8 lb

## 2023-04-19 DIAGNOSIS — Z8619 Personal history of other infectious and parasitic diseases: Secondary | ICD-10-CM

## 2023-04-19 DIAGNOSIS — Z23 Encounter for immunization: Secondary | ICD-10-CM

## 2023-04-19 DIAGNOSIS — E039 Hypothyroidism, unspecified: Secondary | ICD-10-CM

## 2023-04-19 DIAGNOSIS — R519 Headache, unspecified: Secondary | ICD-10-CM

## 2023-04-19 DIAGNOSIS — F900 Attention-deficit hyperactivity disorder, predominantly inattentive type: Secondary | ICD-10-CM

## 2023-04-19 DIAGNOSIS — G43909 Migraine, unspecified, not intractable, without status migrainosus: Secondary | ICD-10-CM

## 2023-04-19 DIAGNOSIS — F419 Anxiety disorder, unspecified: Secondary | ICD-10-CM | POA: Diagnosis not present

## 2023-04-19 DIAGNOSIS — F32A Depression, unspecified: Secondary | ICD-10-CM | POA: Diagnosis not present

## 2023-04-19 MED ORDER — HYDROXYZINE HCL 25 MG PO TABS
ORAL_TABLET | ORAL | 0 refills | Status: DC
Start: 2023-04-19 — End: 2023-05-16

## 2023-04-19 MED ORDER — VALACYCLOVIR HCL 500 MG PO TABS: ORAL_TABLET | ORAL | 2 refills | Status: DC

## 2023-04-19 MED ORDER — SUMATRIPTAN SUCCINATE 50 MG PO TABS
ORAL_TABLET | ORAL | 2 refills | Status: DC
Start: 2023-04-19 — End: 2023-07-23

## 2023-04-19 NOTE — Assessment & Plan Note (Addendum)
Improved from prior . She is no longer on prozac. Compliant with celexa 20mg . Discussed h/o bipolar; she is somewhat hesistant to follow up with psychiatry. We discussed trial of atarax. I have sent a note to Dr Elna Breslow in regards to recommendations.   Update 04/24/23: Received  note from Dr. Elna Breslow in regards to a trial of Seroquel 25 to 50 mg as long as patient aware of side effects including tardive dyskinesia, metabolic syndrome, QT prolongation.  EKG would need to be monitored.  She also advised  augmenting hydroxyzine with  melatonin combinations like sleep#3 or qunol 5 .  Patient politely declined starting Seroquel at this time.  She would like to try over-the-counter agents first.  She will let me know how she is doing.

## 2023-04-19 NOTE — Progress Notes (Unsigned)
Assessment & Plan:  Anxiety and depression Assessment & Plan: Improved from prior . She is no longer on prozac. Compliant with celexa 20mg . Discussed h/o bipolar; she is somewhat hesistant to follow up with psychiatry. We discussed trial of atarax. I have sent a note to Dr Elna Breslow in regards to recommendations.   Update 04/24/23: Received  note from Dr. Elna Breslow in regards to a trial of Seroquel 25 to 50 mg as long as patient aware of side effects including tardive dyskinesia, metabolic syndrome, QT prolongation.  EKG would need to be monitored.  She also advised  augmenting hydroxyzine with  melatonin combinations like sleep#3 or qunol 5 .  Patient politely declined starting Seroquel at this time.  She would like to try over-the-counter agents first.  She will let me know how she is doing.    Orders: -     hydrOXYzine HCl; Take half to one tablet 30-60 minutes prior to bedtime for insomnia, anxiety. May increase to two tablets ( 50mg ).  Dispense: 30 tablet; Refill: 0  Migraine without status migrainosus, not intractable, unspecified migraine type -     Comprehensive metabolic panel; Future  Acquired hypothyroidism -     Lipid panel; Future  H/O herpes genitalis -     valACYclovir HCl; TAKE 500 MG BY MOUTH X EVERY 12 HOURS X 3 DAYS. ASAP W/IN 24H OF SYMPTOMS ONSET.  Dispense: 30 tablet; Refill: 2  Need for influenza vaccination -     Flu vaccine trivalent PF, 6mos and older(Flulaval,Afluria,Fluarix,Fluzone)  Frequent headaches -     SUMAtriptan Succinate; TAKE 1 TAB BY MOUTH DAILY. MAY REPEAT ONCE 2 HRS LATER IF HEADACHE PERSISTS OR RECURS. MAX 2 DOSES.  Dispense: 8 tablet; Refill: 2  Attention deficit hyperactivity disorder, predominantly inattentive type -     Ambulatory referral to Psychiatry     Return precautions given.   Risks, benefits, and alternatives of the medications and treatment plan prescribed today were discussed, and patient expressed understanding.   Education  regarding symptom management and diagnosis given to patient on AVS either electronically or printed.  No follow-ups on file.  Rennie Plowman, FNP  Subjective:    Patient ID: Rachel Alexander, female    DOB: [birth year], 42 y.o.   MRN: [id number]  CC: Rachel Alexander is a 42 y.o. female who presents today for follow up.   HPI: Anxiety and depression has improved. She has been on celexa x 8 weeks now. She is no longer on prozac.   Grandmother passed 2 weeks ago. She has had trouble falling asleep.  She is using often.  She feels like her mind is "on"     She is no longer on depakote ; she didn't feel like herself on medication.  Denies a period of having more energy than usual, didn't require sleep, spending more money than usual and got into trouble, a time when so hyper got into trouble, more irritated that you started arguments.   Denies thoughts of hurting herself or anyone else.    TSH 03/13/23; she continues to follow with endocrinology, Dr Lonzo Cloud  Headaches overall unchanged from prior.  She has been taking Imitrex 50 mg requesting a refill.    Past trials of Lexapro, Prozac, Celexa, Wellbutrin, Adderall, risperidone, Xanax.   Allergies: Patient has no known allergies. Current Outpatient Medications on File Prior to Visit  Medication Sig Dispense Refill   citalopram (CELEXA) 20 MG tablet Take 1 tablet (20 mg total) by mouth daily. 90 tablet 1  SYNTHROID 150 MCG tablet Take 1 tablet (150 mcg total) by mouth daily before breakfast. (Patient taking differently: Take 175 mcg by mouth daily before breakfast.) 90 tablet 3   [DISCONTINUED] dicyclomine (BENTYL) 20 MG tablet Take 1 tablet (20 mg total) by mouth 2 (two) times daily. 20 tablet 0   No current facility-administered medications on file prior to visit.    Review of Systems  Constitutional:  Negative for chills and fever.  Respiratory:  Negative for cough.   Cardiovascular:  Negative for chest pain and palpitations.   Gastrointestinal:  Negative for nausea and vomiting.      Objective:    BP 116/76   Pulse 76   Temp 98.1 F (36.7 C) (Oral)   Ht 5\' 2"  (1.575 m)   Wt 120 lb 12.8 oz (54.8 kg)   SpO2 98%   BMI 22.09 kg/m  BP Readings from Last 3 Encounters:  04/19/23 116/76  11/20/22 118/70  05/29/22 100/70   Wt Readings from Last 3 Encounters:  04/19/23 120 lb 12.8 oz (54.8 kg)  11/20/22 111 lb 3.2 oz (50.4 kg)  05/29/22 103 lb 9.6 oz (47 kg)    Physical Exam Vitals reviewed.  Constitutional:      Appearance: She is well-developed.  Eyes:     Conjunctiva/sclera: Conjunctivae normal.  Cardiovascular:     Rate and Rhythm: Normal rate and regular rhythm.     Pulses: Normal pulses.     Heart sounds: Normal heart sounds.  Pulmonary:     Effort: Pulmonary effort is normal.     Breath sounds: Normal breath sounds. No wheezing, rhonchi or rales.  Skin:    General: Skin is warm and dry.  Neurological:     Mental Status: She is alert.  Psychiatric:        Speech: Speech normal.        Behavior: Behavior normal.        Thought Content: Thought content normal.

## 2023-04-24 ENCOUNTER — Encounter: Payer: Self-pay | Admitting: Family

## 2023-04-27 ENCOUNTER — Other Ambulatory Visit: Payer: 59

## 2023-05-01 ENCOUNTER — Other Ambulatory Visit: Payer: Self-pay | Admitting: Family

## 2023-05-01 DIAGNOSIS — R519 Headache, unspecified: Secondary | ICD-10-CM

## 2023-05-15 ENCOUNTER — Other Ambulatory Visit: Payer: Self-pay | Admitting: Family

## 2023-05-15 DIAGNOSIS — F32A Depression, unspecified: Secondary | ICD-10-CM

## 2023-05-16 ENCOUNTER — Other Ambulatory Visit: Payer: Self-pay | Admitting: Family

## 2023-05-16 DIAGNOSIS — F419 Anxiety disorder, unspecified: Secondary | ICD-10-CM

## 2023-05-16 NOTE — Telephone Encounter (Signed)
Is it okay to send in a 90 day supply?

## 2023-05-16 NOTE — Telephone Encounter (Signed)
Refilled 3 weeks ago. Duplicate request.

## 2023-06-13 ENCOUNTER — Other Ambulatory Visit: Payer: Self-pay | Admitting: Family

## 2023-06-13 DIAGNOSIS — F419 Anxiety disorder, unspecified: Secondary | ICD-10-CM

## 2023-07-21 ENCOUNTER — Other Ambulatory Visit: Payer: Self-pay | Admitting: Family

## 2023-07-21 DIAGNOSIS — R519 Headache, unspecified: Secondary | ICD-10-CM

## 2023-08-08 ENCOUNTER — Other Ambulatory Visit: Payer: Self-pay | Admitting: Family

## 2023-08-08 DIAGNOSIS — F32A Depression, unspecified: Secondary | ICD-10-CM

## 2023-08-12 ENCOUNTER — Other Ambulatory Visit: Payer: Self-pay | Admitting: Internal Medicine

## 2023-08-13 ENCOUNTER — Encounter: Payer: Self-pay | Admitting: Family

## 2023-08-13 ENCOUNTER — Other Ambulatory Visit: Payer: Self-pay | Admitting: Family

## 2023-08-13 DIAGNOSIS — E039 Hypothyroidism, unspecified: Secondary | ICD-10-CM

## 2023-08-13 MED ORDER — SYNTHROID 150 MCG PO TABS
150.0000 ug | ORAL_TABLET | Freq: Every day | ORAL | 1 refills | Status: DC
Start: 2023-08-13 — End: 2023-08-22

## 2023-08-13 NOTE — Telephone Encounter (Signed)
 Waiting for pt to confirm correct dose of Synthroid. Please see prior mychart conversations.

## 2023-08-15 ENCOUNTER — Other Ambulatory Visit: Payer: Self-pay | Admitting: Family

## 2023-08-15 DIAGNOSIS — F32A Depression, unspecified: Secondary | ICD-10-CM

## 2023-08-20 ENCOUNTER — Encounter: Payer: Self-pay | Admitting: Family

## 2023-08-20 ENCOUNTER — Ambulatory Visit (INDEPENDENT_AMBULATORY_CARE_PROVIDER_SITE_OTHER): Payer: 59 | Admitting: Family

## 2023-08-20 VITALS — BP 118/76 | HR 87 | Temp 97.8°F | Ht 62.0 in | Wt 132.4 lb

## 2023-08-20 DIAGNOSIS — E039 Hypothyroidism, unspecified: Secondary | ICD-10-CM | POA: Diagnosis not present

## 2023-08-20 DIAGNOSIS — R102 Pelvic and perineal pain: Secondary | ICD-10-CM

## 2023-08-20 DIAGNOSIS — Z136 Encounter for screening for cardiovascular disorders: Secondary | ICD-10-CM | POA: Diagnosis not present

## 2023-08-20 DIAGNOSIS — F419 Anxiety disorder, unspecified: Secondary | ICD-10-CM | POA: Diagnosis not present

## 2023-08-20 DIAGNOSIS — F3162 Bipolar disorder, current episode mixed, moderate: Secondary | ICD-10-CM

## 2023-08-20 DIAGNOSIS — Z13220 Encounter for screening for lipoid disorders: Secondary | ICD-10-CM | POA: Diagnosis not present

## 2023-08-20 DIAGNOSIS — F32A Depression, unspecified: Secondary | ICD-10-CM

## 2023-08-20 LAB — CBC WITH DIFFERENTIAL/PLATELET
Basophils Absolute: 0 10*3/uL (ref 0.0–0.1)
Basophils Relative: 0.2 % (ref 0.0–3.0)
Eosinophils Absolute: 0 10*3/uL (ref 0.0–0.7)
Eosinophils Relative: 0 % (ref 0.0–5.0)
HCT: 34.4 % — ABNORMAL LOW (ref 36.0–46.0)
Hemoglobin: 11.4 g/dL — ABNORMAL LOW (ref 12.0–15.0)
Lymphocytes Relative: 30.7 % (ref 12.0–46.0)
Lymphs Abs: 1.4 10*3/uL (ref 0.7–4.0)
MCHC: 33.2 g/dL (ref 30.0–36.0)
MCV: 101.7 fl — ABNORMAL HIGH (ref 78.0–100.0)
Monocytes Absolute: 0.4 10*3/uL (ref 0.1–1.0)
Monocytes Relative: 8.1 % (ref 3.0–12.0)
Neutro Abs: 2.8 10*3/uL (ref 1.4–7.7)
Neutrophils Relative %: 61 % (ref 43.0–77.0)
Platelets: 218 10*3/uL (ref 150.0–400.0)
RBC: 3.39 Mil/uL — ABNORMAL LOW (ref 3.87–5.11)
RDW: 12.8 % (ref 11.5–15.5)
WBC: 4.5 10*3/uL (ref 4.0–10.5)

## 2023-08-20 LAB — URINALYSIS, ROUTINE W REFLEX MICROSCOPIC
Bilirubin Urine: NEGATIVE
Ketones, ur: NEGATIVE
Nitrite: NEGATIVE
Specific Gravity, Urine: 1.02 (ref 1.000–1.030)
Total Protein, Urine: NEGATIVE
Urine Glucose: NEGATIVE
Urobilinogen, UA: 0.2 (ref 0.0–1.0)
pH: 6 (ref 5.0–8.0)

## 2023-08-20 LAB — COMPREHENSIVE METABOLIC PANEL
ALT: 9 U/L (ref 0–35)
AST: 15 U/L (ref 0–37)
Albumin: 3.9 g/dL (ref 3.5–5.2)
Alkaline Phosphatase: 35 U/L — ABNORMAL LOW (ref 39–117)
BUN: 16 mg/dL (ref 6–23)
CO2: 30 meq/L (ref 19–32)
Calcium: 8.9 mg/dL (ref 8.4–10.5)
Chloride: 103 meq/L (ref 96–112)
Creatinine, Ser: 0.9 mg/dL (ref 0.40–1.20)
GFR: 78.04 mL/min (ref >60.00)
Glucose, Bld: 83 mg/dL (ref 70–99)
Potassium: 4.1 meq/L (ref 3.5–5.1)
Sodium: 141 meq/L (ref 135–145)
Total Bilirubin: 0.3 mg/dL (ref 0.2–1.2)
Total Protein: 6.3 g/dL (ref 6.0–8.3)

## 2023-08-20 LAB — LIPID PANEL
Cholesterol: 255 mg/dL — ABNORMAL HIGH (ref 0–200)
HDL: 76.4 mg/dL (ref >39.00)
LDL Cholesterol: 151 mg/dL — ABNORMAL HIGH (ref 0–99)
NonHDL: 178.6
Total CHOL/HDL Ratio: 3
Triglycerides: 137 mg/dL (ref 0.0–149.0)
VLDL: 27.4 mg/dL (ref 0.0–40.0)

## 2023-08-20 LAB — TSH: TSH: 50.15 u[IU]/mL — ABNORMAL HIGH (ref 0.35–5.50)

## 2023-08-20 LAB — T4, FREE: Free T4: 0.53 ng/dL — ABNORMAL LOW (ref 0.60–1.60)

## 2023-08-20 MED ORDER — QUETIAPINE FUMARATE 25 MG PO TABS
25.0000 mg | ORAL_TABLET | Freq: Every day | ORAL | 1 refills | Status: DC
Start: 2023-08-20 — End: 2023-10-19

## 2023-08-20 NOTE — Assessment & Plan Note (Signed)
 Chronic, suboptimal control.  History of bipolar.  No evidence of manic symptoms today.  Continue Celexa 20 mg daily.  Start Seroquel 25 mg daily as opposed to increasing Celexa.  EKG normal sinus rhythm.  No significant changes when compared to previous 05/22/2022. plan to repeat EKG at follow-up to monitor for QT prolongation EKG and case reviewed with supervising, Dr Duncan Dull, and she and I jointly agreed on management plan.

## 2023-08-20 NOTE — Patient Instructions (Signed)
 Start magnesium citrate 400mg  daily.  Please let me know how you are doing.

## 2023-08-20 NOTE — Progress Notes (Unsigned)
 Assessment & Plan:  There are no diagnoses linked to this encounter.   Return precautions given.   Risks, benefits, and alternatives of the medications and treatment plan prescribed today were discussed, and patient expressed understanding.   Education regarding symptom management and diagnosis given to patient on AVS either electronically or printed.  No follow-ups on file.  Rennie Plowman, FNP  Subjective:    Patient ID: Rachel Alexander, female    DOB: [birth year], 43 y.o.   MRN: [id number]  CC: Payzlee Ryder is a 43 y.o. female who presents today for follow up.   HPI: Complains abdominal bloating, bilateral pelvic pain and constipation only during menses . Resolved today.   LMP  08/11/23  She has been taking miralax since menses last month and movements are soft, daily  Menses about 5 days, heavy the 1st two days. She goes through super tampon in one hour.  No abdominal pain, dysuria, fever  H/o uterine fibroid  08/2019 Transvaginal US with   1 cm intramural fibroid at the left uterine body.   Compliant with Synthroid 150 mcg daily.  She is no longer following with Dr Lonzo Cloud, endocrinology  Doing better on celexa 20mg . She is seeing more of her sister which has been therapeutic for her. She is interested in increasing celexa.   She is taking melatonin to sleep. Sleeping well.  Occasionally she takes an old trazodone prescription to help with sleep.   In April of 2024,She tried depakote 250-500mg  and felt in a 'haze' She is no longer on xanax, klonpopin.   Denies a period of having more energy than usual, didn't require sleep, spending more money than usual and got into trouble, a time when so hyper got into trouble, more irritated that you started arguments.  Denies that these acts caused trouble at work, financially, or with family or personal relationships.   Denies SI/HI.   History of ligation tubal ligation   Allergies: Patient has no known  allergies. Current Outpatient Medications on File Prior to Visit  Medication Sig Dispense Refill   citalopram (CELEXA) 20 MG tablet TAKE 1 TABLET BY MOUTH EVERY DAY 90 tablet 0   hydrOXYzine (ATARAX) 25 MG tablet TAKE HALF TO ONE TABLET 30-60 MINUTES PRIOR TO BEDTIME FOR INSOMNIA, ANXIETY. MAY INCREASE TO TWO TABLETS ( 50MG ). 180 tablet 2   SUMAtriptan (IMITREX) 50 MG tablet TAKE 1 TAB BY MOUTH DAILY. MAY REPEAT ONCE 2 HRS LATER IF HEADACHE PERSISTS OR RECURS. MAX 2 DOSES. 8 tablet 2   SYNTHROID 150 MCG tablet Take 1 tablet (150 mcg total) by mouth daily before breakfast. 90 tablet 1   valACYclovir (VALTREX) 500 MG tablet TAKE 500 MG BY MOUTH X EVERY 12 HOURS X 3 DAYS. ASAP W/IN 24H OF SYMPTOMS ONSET. 30 tablet 2   [DISCONTINUED] dicyclomine (BENTYL) 20 MG tablet Take 1 tablet (20 mg total) by mouth 2 (two) times daily. 20 tablet 0   No current facility-administered medications on file prior to visit.    Review of Systems  Constitutional:  Negative for chills and fever.  Respiratory:  Negative for cough.   Cardiovascular:  Negative for chest pain and palpitations.  Gastrointestinal:  Negative for abdominal distention, nausea and vomiting.  Genitourinary:  Positive for dysuria, menstrual problem and pelvic pain. Negative for vaginal discharge and vaginal pain.      Objective:    BP 118/76   Pulse 87   Temp 97.8 F (36.6 C) (Oral)   Ht 5\' 2"  (1.575 m)  Wt 132 lb 6.4 oz (60.1 kg)   LMP 08/15/2023 (Approximate)   SpO2 98%   BMI 24.22 kg/m  BP Readings from Last 3 Encounters:  08/20/23 118/76  04/19/23 116/76  11/20/22 118/70   Wt Readings from Last 3 Encounters:  08/20/23 132 lb 6.4 oz (60.1 kg)  04/19/23 120 lb 12.8 oz (54.8 kg)  11/20/22 111 lb 3.2 oz (50.4 kg)    Physical Exam Vitals reviewed.  Constitutional:      Appearance: Normal appearance. She is well-developed.  Eyes:     Conjunctiva/sclera: Conjunctivae normal.  Cardiovascular:     Rate and Rhythm: Normal  rate and regular rhythm.     Pulses: Normal pulses.     Heart sounds: Normal heart sounds.  Pulmonary:     Effort: Pulmonary effort is normal.     Breath sounds: Normal breath sounds. No wheezing, rhonchi or rales.  Abdominal:     General: Bowel sounds are normal. There is no distension.     Palpations: Abdomen is soft. Abdomen is not rigid. There is no fluid wave or mass.     Tenderness: There is no abdominal tenderness. There is no guarding or rebound.  Skin:    General: Skin is warm and dry.  Neurological:     Mental Status: She is alert.  Psychiatric:        Speech: Speech normal.        Behavior: Behavior normal.        Thought Content: Thought content normal.

## 2023-08-22 ENCOUNTER — Ambulatory Visit
Admission: RE | Admit: 2023-08-22 | Discharge: 2023-08-22 | Disposition: A | Discharge status: Home / Self Care | Source: Ambulatory Visit | Attending: Family | Admitting: Family

## 2023-08-22 ENCOUNTER — Other Ambulatory Visit: Payer: Self-pay | Admitting: Family

## 2023-08-22 ENCOUNTER — Encounter: Payer: Self-pay | Admitting: Family

## 2023-08-22 DIAGNOSIS — R899 Unspecified abnormal finding in specimens from other organs, systems and tissues: Secondary | ICD-10-CM

## 2023-08-22 DIAGNOSIS — E039 Hypothyroidism, unspecified: Secondary | ICD-10-CM

## 2023-08-22 DIAGNOSIS — R102 Pelvic and perineal pain: Secondary | ICD-10-CM | POA: Insufficient documentation

## 2023-08-22 MED ORDER — SYNTHROID 150 MCG PO TABS
ORAL_TABLET | ORAL | Status: DC
Start: 2023-08-22 — End: 2024-02-08

## 2023-08-23 NOTE — Assessment & Plan Note (Signed)
 Chronic, difficult to control.  Patient compliant with Synthroid 150 mcg daily.  Referral to [facility] endocrinology, second opinion.

## 2023-08-23 NOTE — Telephone Encounter (Signed)
 Spoke to pt and scheduled  6 week lab appt for April 9,2025

## 2023-08-23 NOTE — Assessment & Plan Note (Addendum)
 No abdominal pain, pelvic pain today.  In the setting of abdominal bloating, reviewed chart, celiac antibodies negative 07/2021.  pending ultrasound, labs.

## 2023-08-30 DIAGNOSIS — R509 Fever, unspecified: Secondary | ICD-10-CM | POA: Diagnosis not present

## 2023-08-30 DIAGNOSIS — R5383 Other fatigue: Secondary | ICD-10-CM | POA: Diagnosis not present

## 2023-08-30 DIAGNOSIS — R300 Dysuria: Secondary | ICD-10-CM | POA: Diagnosis not present

## 2023-08-30 DIAGNOSIS — M545 Low back pain, unspecified: Secondary | ICD-10-CM | POA: Diagnosis not present

## 2023-09-03 ENCOUNTER — Encounter: Payer: Self-pay | Admitting: Family

## 2023-09-03 ENCOUNTER — Other Ambulatory Visit: Payer: Self-pay | Admitting: Family

## 2023-09-03 DIAGNOSIS — R519 Headache, unspecified: Secondary | ICD-10-CM

## 2023-09-03 MED ORDER — SUMATRIPTAN SUCCINATE 100 MG PO TABS: ORAL_TABLET | ORAL | 11 refills | Status: AC

## 2023-09-03 NOTE — Telephone Encounter (Signed)
 Spoke to pt she stated that the HA are pretty severe only relief she gets is when she takes the Imitrex she does wear glasses last eye appt was in Sept 2024. Pt stated that she will go to Ed for Neuroimaging

## 2023-09-04 ENCOUNTER — Emergency Department

## 2023-09-04 ENCOUNTER — Emergency Department
Admission: EM | Admit: 2023-09-04 | Discharge: 2023-09-04 | Disposition: A | Discharge status: Home / Self Care | Attending: Student in an Organized Health Care Education/Training Program | Admitting: Student in an Organized Health Care Education/Training Program

## 2023-09-04 ENCOUNTER — Encounter: Payer: Self-pay | Admitting: Emergency Medicine

## 2023-09-04 ENCOUNTER — Other Ambulatory Visit: Payer: Self-pay

## 2023-09-04 DIAGNOSIS — R519 Headache, unspecified: Secondary | ICD-10-CM | POA: Diagnosis not present

## 2023-09-04 LAB — CBC WITH DIFFERENTIAL/PLATELET
Abs Immature Granulocytes: 0.01 10*3/uL (ref 0.00–0.07)
Basophils Absolute: 0 10*3/uL (ref 0.0–0.1)
Basophils Relative: 0 %
Eosinophils Absolute: 0 10*3/uL (ref 0.0–0.5)
Eosinophils Relative: 0 %
HCT: 37.6 % (ref 36.0–46.0)
Hemoglobin: 12.4 g/dL (ref 12.0–15.0)
Immature Granulocytes: 0 %
Lymphocytes Relative: 45 %
Lymphs Abs: 1.8 10*3/uL (ref 0.7–4.0)
MCH: 33.3 pg (ref 26.0–34.0)
MCHC: 33 g/dL (ref 30.0–36.0)
MCV: 101.1 fL — ABNORMAL HIGH (ref 80.0–100.0)
Monocytes Absolute: 0.5 10*3/uL (ref 0.1–1.0)
Monocytes Relative: 11 %
Neutro Abs: 1.8 10*3/uL (ref 1.7–7.7)
Neutrophils Relative %: 44 %
Platelets: 217 10*3/uL (ref 150–400)
RBC: 3.72 MIL/uL — ABNORMAL LOW (ref 3.87–5.11)
RDW: 12.1 % (ref 11.5–15.5)
WBC: 4.1 10*3/uL (ref 4.0–10.5)
nRBC: 0 % (ref 0.0–0.2)

## 2023-09-04 LAB — BASIC METABOLIC PANEL
Anion gap: 6 (ref 5–15)
BUN: 15 mg/dL (ref 6–20)
CO2: 29 mmol/L (ref 22–32)
Calcium: 9.2 mg/dL (ref 8.9–10.3)
Chloride: 102 mmol/L (ref 98–111)
Creatinine, Ser: 0.74 mg/dL (ref 0.44–1.00)
GFR, Estimated: >60 mL/min (ref >60)
Glucose, Bld: 96 mg/dL (ref 70–99)
Potassium: 4 mmol/L (ref 3.5–5.1)
Sodium: 137 mmol/L (ref 135–145)

## 2023-09-04 MED ORDER — DEXAMETHASONE SODIUM PHOSPHATE 10 MG/ML IJ SOLN: 10.0000 mg | Freq: Once | INTRAMUSCULAR | Status: DC

## 2023-09-04 MED ORDER — ACETAMINOPHEN 500 MG PO TABS
1000.0000 mg | ORAL_TABLET | Freq: Once | ORAL | Status: AC
  Administered 2023-09-04: 1000 mg via ORAL
  Filled 2023-09-04: qty 2

## 2023-09-04 MED ORDER — DIPHENHYDRAMINE HCL 50 MG/ML IJ SOLN
12.5000 mg | Freq: Once | INTRAMUSCULAR | Status: AC
  Administered 2023-09-04: 12.5 mg via INTRAVENOUS
  Filled 2023-09-04: qty 1

## 2023-09-04 MED ORDER — PROCHLORPERAZINE MALEATE 10 MG PO TABS: 10.0000 mg | ORAL_TABLET | Freq: Three times a day (TID) | ORAL | 0 refills | Status: AC | PRN

## 2023-09-04 MED ORDER — SODIUM CHLORIDE 0.9 % IV BOLUS
1000.0000 mL | Freq: Once | INTRAVENOUS | Status: AC
  Administered 2023-09-04: 1000 mL via INTRAVENOUS

## 2023-09-04 MED ORDER — PROCHLORPERAZINE EDISYLATE 10 MG/2ML IJ SOLN
10.0000 mg | Freq: Once | INTRAMUSCULAR | Status: AC
  Administered 2023-09-04: 10 mg via INTRAVENOUS
  Filled 2023-09-04: qty 2

## 2023-09-04 NOTE — ED Triage Notes (Signed)
 Patient to ED via POV for headache x1 week. States having light sensitivity and blurred vision. Hx of migraines.

## 2023-09-04 NOTE — ED Notes (Signed)
 See triage note. Presents with headache for 1 week. States pain is mainly behind her eyes and moves around her head Denies any fever  Positive nausea and some blurred vision and fatigue.  States hx of migraines and but states this headache is different

## 2023-09-04 NOTE — ED Provider Notes (Signed)
 [facility] Provider Note    Event Date/Time   First MD Initiated Contact with Patient 09/04/23 0800     (approximate)   History   Headache   HPI  Rachel Alexander is a 43 y.o. female with a history of migraine headaches presents to the ER for evaluation of 5 days of persistent headache.  Worse in the morning.  Was sent by PMP to the ER for imaging.  She denies any numbness or tingling.  Is having some floaters as well as blurred vision and photophobia as well as phonophobia.  No neck stiffness no measured fevers.  No numbness or tingling.     Physical Exam   Triage Vital Signs: ED Triage Vitals  Encounter Vitals Group     BP 09/04/23 0752 121/79     Systolic BP Percentile --      Diastolic BP Percentile --      Pulse Rate 09/04/23 0752 67     Resp 09/04/23 0752 18     Temp 09/04/23 0752 98.1 F (36.7 C)     Temp Source 09/04/23 0752 Oral     SpO2 09/04/23 0752 97 %     Weight 09/04/23 0750 130 lb (59 kg)     Height 09/04/23 0750 5\' 2"  (1.575 m)     Head Circumference --      Peak Flow --      Pain Score 09/04/23 0750 7     Pain Loc --      Pain Education --      Exclude from Growth Chart --     Most recent vital signs: Vitals:   09/04/23 0752  BP: 121/79  Pulse: 67  Resp: 18  Temp: 98.1 F (36.7 C)  SpO2: 97%     Constitutional: Alert  Eyes: Conjunctivae are normal.  Head: Atraumatic. Nose: No congestion/rhinnorhea. Mouth/Throat: Mucous membranes are moist.   Neck: Painless ROM.  Cardiovascular:   Good peripheral circulation. Respiratory: Normal respiratory effort.  No retractions.  Gastrointestinal: Soft and nontender.  Musculoskeletal:  no deformity Neurologic:  MAE spontaneously. No gross focal neurologic deficits are appreciated.  Skin:  Skin is warm, dry and intact. No rash noted. Psychiatric: Mood and affect are normal. Speech and behavior are normal.    ED Results / Procedures / Treatments   Labs (all labs  ordered are listed, but only abnormal results are displayed) Labs Reviewed  CBC WITH DIFFERENTIAL/PLATELET - Abnormal; Notable for the following components:      Result Value   RBC 3.72 (*)    MCV 101.1 (*)    All other components within normal limits  BASIC METABOLIC PANEL     EKG     RADIOLOGY    PROCEDURES:  Critical Care performed: No  Procedures   MEDICATIONS ORDERED IN ED: Medications  prochlorperazine (COMPAZINE) injection 10 mg (10 mg Intravenous Given 09/04/23 0856)  diphenhydrAMINE (BENADRYL) injection 12.5 mg (12.5 mg Intravenous Given 09/04/23 0856)  acetaminophen (TYLENOL) tablet 1,000 mg (1,000 mg Oral Given 09/04/23 0856)  sodium chloride 0.9 % bolus 1,000 mL (1,000 mLs Intravenous New Bag/Given 09/04/23 0856)     IMPRESSION / MDM / ASSESSMENT AND PLAN / ED COURSE  I reviewed the triage vital signs and the nursing notes.                              Differential diagnosis includes, but is not limited to, migraine,  tension, mass, SAH, IPH, meningitis, cellulitis  Patient presenting to the ER for evaluation of symptoms as described above.  Based on symptoms, risk factors and considered above differential, this presenting complaint could reflect a potentially life-threatening illness therefore the patient will be placed on continuous pulse oximetry and telemetry for monitoring.  Laboratory evaluation will be sent to evaluate for the above complaints.  Patient nontoxic-appearing does not clinically consistent with meningitis or encephalitis.  No signs to suggest infectious process.  Will order CT to further evaluate will give migraine cocktail.   Clinical Course as of 09/04/23 0928  Tue Sep 04, 2023  0853 CT head on my review and interpretation without evidence of acute abnormality. [PR]    Clinical Course User Index [PR] Willy Eddy, MD   ----------------------------------------- 9:25 AM on  09/04/2023 ----------------------------------------- Patient reassessed.  Headache resolved.  Given reassuring imaging workup I do believe she is appropriate for outpatient follow-up.     FINAL CLINICAL IMPRESSION(S) / ED DIAGNOSES   Final diagnoses:  Bad headache     Rx / DC Orders   ED Discharge Orders          Ordered    prochlorperazine (COMPAZINE) 10 MG tablet  Every 8 hours PRN        09/04/23 0924             Note:  This document was prepared using Dragon voice recognition software and may include unintentional dictation errors.    Willy Eddy, MD 09/04/23 [phone]

## 2023-09-19 ENCOUNTER — Encounter: Payer: Self-pay | Admitting: Family

## 2023-09-20 ENCOUNTER — Other Ambulatory Visit: Payer: Self-pay | Admitting: Family

## 2023-09-20 DIAGNOSIS — N946 Dysmenorrhea, unspecified: Secondary | ICD-10-CM

## 2023-09-26 ENCOUNTER — Other Ambulatory Visit

## 2023-10-19 ENCOUNTER — Encounter: Payer: Self-pay | Admitting: Family

## 2023-10-19 ENCOUNTER — Ambulatory Visit: Admitting: Family

## 2023-10-19 ENCOUNTER — Ambulatory Visit (INDEPENDENT_AMBULATORY_CARE_PROVIDER_SITE_OTHER): Admitting: Licensed Practical Nurse

## 2023-10-19 VITALS — BP 93/71 | HR 68 | Ht 62.0 in | Wt 132.3 lb

## 2023-10-19 VITALS — BP 118/70 | HR 65 | Temp 97.9°F | Ht 62.0 in | Wt 132.2 lb

## 2023-10-19 DIAGNOSIS — F419 Anxiety disorder, unspecified: Secondary | ICD-10-CM

## 2023-10-19 DIAGNOSIS — F32A Depression, unspecified: Secondary | ICD-10-CM | POA: Diagnosis not present

## 2023-10-19 DIAGNOSIS — G43909 Migraine, unspecified, not intractable, without status migrainosus: Secondary | ICD-10-CM

## 2023-10-19 DIAGNOSIS — R899 Unspecified abnormal finding in specimens from other organs, systems and tissues: Secondary | ICD-10-CM

## 2023-10-19 DIAGNOSIS — N946 Dysmenorrhea, unspecified: Secondary | ICD-10-CM

## 2023-10-19 DIAGNOSIS — E039 Hypothyroidism, unspecified: Secondary | ICD-10-CM | POA: Diagnosis not present

## 2023-10-19 DIAGNOSIS — F900 Attention-deficit hyperactivity disorder, predominantly inattentive type: Secondary | ICD-10-CM | POA: Diagnosis not present

## 2023-10-19 DIAGNOSIS — N951 Menopausal and female climacteric states: Secondary | ICD-10-CM | POA: Diagnosis not present

## 2023-10-19 LAB — CBC WITH DIFFERENTIAL/PLATELET
Basophils Absolute: 0 10*3/uL (ref 0.0–0.1)
Basophils Relative: 0.5 % (ref 0.0–3.0)
Eosinophils Absolute: 0 10*3/uL (ref 0.0–0.7)
Eosinophils Relative: 0 % (ref 0.0–5.0)
HCT: 38.4 % (ref 36.0–46.0)
Hemoglobin: 12.6 g/dL (ref 12.0–15.0)
Lymphocytes Relative: 38.7 % (ref 12.0–46.0)
Lymphs Abs: 1.6 10*3/uL (ref 0.7–4.0)
MCHC: 32.9 g/dL (ref 30.0–36.0)
MCV: 101.7 fl — ABNORMAL HIGH (ref 78.0–100.0)
Monocytes Absolute: 0.5 10*3/uL (ref 0.1–1.0)
Monocytes Relative: 11.6 % (ref 3.0–12.0)
Neutro Abs: 2 10*3/uL (ref 1.4–7.7)
Neutrophils Relative %: 49.2 % (ref 43.0–77.0)
Platelets: 182 10*3/uL (ref 150.0–400.0)
RBC: 3.78 Mil/uL — ABNORMAL LOW (ref 3.87–5.11)
RDW: 13.5 % (ref 11.5–15.5)
WBC: 4.1 10*3/uL (ref 4.0–10.5)

## 2023-10-19 LAB — IBC + FERRITIN
Ferritin: 7.1 ng/mL — ABNORMAL LOW (ref 10.0–291.0)
Iron: 137 ug/dL (ref 42–145)
Saturation Ratios: 31.7 % (ref 20.0–50.0)
TIBC: 432.6 ug/dL (ref 250.0–450.0)
Transferrin: 309 mg/dL (ref 212.0–360.0)

## 2023-10-19 LAB — B12 AND FOLATE PANEL
Folate: 20.9 ng/mL (ref >5.9)
Vitamin B-12: 446 pg/mL (ref 211–911)

## 2023-10-19 LAB — URINALYSIS, ROUTINE W REFLEX MICROSCOPIC
Bilirubin Urine: NEGATIVE
Hgb urine dipstick: NEGATIVE
Ketones, ur: NEGATIVE
Leukocytes,Ua: NEGATIVE
Nitrite: NEGATIVE
Specific Gravity, Urine: 1.02 (ref 1.000–1.030)
Total Protein, Urine: NEGATIVE
Urine Glucose: NEGATIVE
Urobilinogen, UA: 0.2 (ref 0.0–1.0)
pH: 8 (ref 5.0–8.0)

## 2023-10-19 LAB — TSH: TSH: 45.73 u[IU]/mL — ABNORMAL HIGH (ref 0.35–5.50)

## 2023-10-19 MED ORDER — NORETHINDRONE ACET-ETHINYL EST 1-20 MG-MCG PO TABS
1.0000 | ORAL_TABLET | Freq: Every day | ORAL | 11 refills | Status: AC
Start: 2023-10-19 — End: ?

## 2023-10-19 NOTE — Assessment & Plan Note (Signed)
 Chronic, stable.  She will continue the use of Imitrex  25mg   for rescue

## 2023-10-19 NOTE — Assessment & Plan Note (Signed)
 Referral to be formally tested.

## 2023-10-19 NOTE — Progress Notes (Signed)
 Assessment & Plan:  Anxiety and depression Assessment & Plan: Chronic, stable. continue celexa  20 mg daily, Atarax  25 mg at bedtime prn.  No longer on Seroquel  due to headache.   Acquired hypothyroidism -     Ambulatory referral to Endocrinology -     TSH -     Urinalysis, Routine w reflex microscopic -     B12 and Folate Panel -     IBC + Ferritin  Attention deficit hyperactivity disorder, predominantly inattentive type Assessment & Plan: Referral to be formally tested.   Orders: -     Ambulatory referral to Psychiatry  Abnormal laboratory test -     CBC with Differential/Platelet -     TSH  Migraine without status migrainosus, not intractable, unspecified migraine type Assessment & Plan: Chronic, stable.  She will continue the use of Imitrex  25mg   for rescue      Return precautions given.   Risks, benefits, and alternatives of the medications and treatment plan prescribed today were discussed, and patient expressed understanding.   Education regarding symptom management and diagnosis given to patient on AVS either electronically or printed.  Return in about 6 months (around 04/20/2024).  Bascom Bossier, FNP  Subjective:    Patient ID: Rachel Alexander, female    DOB: January 11, 1981, 43 y.o.   MRN: [id number]  CC: Rachel Alexander is a 43 y.o. female who presents today for follow up.   HPI: No new complaints today.  Feels well today.   Anxiety has improved.  She feels that she is in a better place after losing her grandmother. 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    Started seroquel  2 months ago in which she took for 3 day; had severe HA and hasn't taken since. No recurrence of HA.She keeps imitrex  for rescue therapy.     She has appointment with GYN today LMP 08/12/23 until yesterday she had vaginal bleeding; she had worn a super tampon; no bleeding today.    Allergies: Seroquel  Aramis.[place] ] Current  Outpatient Medications on File Prior to Visit  Medication Sig Dispense Refill   citalopram  (CELEXA ) 20 MG tablet TAKE 1 TABLET BY MOUTH EVERY DAY 90 tablet 0   hydrOXYzine  (ATARAX ) 25 MG tablet TAKE HALF TO ONE TABLET 30-60 MINUTES PRIOR TO BEDTIME FOR INSOMNIA, ANXIETY. MAY INCREASE TO TWO TABLETS ( 50MG ). 180 tablet 2   prochlorperazine  (COMPAZINE ) 10 MG tablet Take 1 tablet (10 mg total) by mouth every 8 (eight) hours as needed for nausea or vomiting (headache). 12 tablet 0   SUMAtriptan  (IMITREX ) 100 MG tablet TAKE 1 TAB BY MOUTH DAILY. MAY REPEAT ONCE 2 HRS LATER IF HEADACHE PERSISTS OR RECURS. MAX 2 DOSES IN ONE DAY. 12 tablet 11   SYNTHROID  150 MCG tablet Take 2 tablets one day per week and 1 tablet 6 days per week.     valACYclovir  (VALTREX ) 500 MG tablet TAKE 500 MG BY MOUTH X EVERY 12 HOURS X 3 DAYS. ASAP W/IN 24H OF SYMPTOMS ONSET. 30 tablet 2   [DISCONTINUED] dicyclomine  (BENTYL ) 20 MG tablet Take 1 tablet (20 mg total) by mouth 2 (two) times daily. 20 tablet 0   No current facility-administered medications on file prior to visit.    Review of Systems  Constitutional:  Negative for chills and fever.  Respiratory:  Negative for cough.   Cardiovascular:  Negative for chest pain and palpitations.  Gastrointestinal:  Negative for nausea and  vomiting.  Psychiatric/Behavioral:  Negative for suicidal ideas. The patient is not nervous/anxious.       Objective:    BP 118/70   Pulse 65   Temp 97.9 F (36.6 C) (Oral)   Ht 5\' 2"  (1.575 m)   Wt 132 lb 3.2 oz (60 kg)   SpO2 99%   BMI 24.18 kg/m  BP Readings from Last 3 Encounters:  10/19/23 118/70  09/04/23 121/79  08/20/23 118/76   Wt Readings from Last 3 Encounters:  10/19/23 132 lb 3.2 oz (60 kg)  09/04/23 130 lb (59 kg)  08/20/23 132 lb 6.4 oz (60.1 kg)      10/19/2023    8:16 AM 08/20/2023    9:16 AM 11/22/2022    1:09 PM  Depression screen PHQ 2/9  Decreased Interest 0 1   Down, Depressed, Hopeless 0 1   PHQ - 2 Score  0 2   Altered sleeping 0 1   Tired, decreased energy 0 1   Change in appetite 0 1   Feeling bad or failure about yourself  0 1   Trouble concentrating 0 1   Moving slowly or fidgety/restless 0 0   Suicidal thoughts 0 0   PHQ-9 Score 0 7   Difficult doing work/chores Not difficult at all Somewhat difficult      Information is confidential and restricted. Go to Review Flowsheets to unlock data.    Physical Exam Vitals reviewed.  Constitutional:      Appearance: She is well-developed.  Eyes:     Conjunctiva/sclera: Conjunctivae normal.  Cardiovascular:     Rate and Rhythm: Normal rate and regular rhythm.     Pulses: Normal pulses.     Heart sounds: Normal heart sounds.  Pulmonary:     Effort: Pulmonary effort is normal.     Breath sounds: Normal breath sounds. No wheezing, rhonchi or rales.  Skin:    General: Skin is warm and dry.  Neurological:     Mental Status: She is alert.  Psychiatric:        Speech: Speech normal.        Behavior: Behavior normal.        Thought Content: Thought content normal.

## 2023-10-19 NOTE — Progress Notes (Unsigned)
 [name], FNP   Chief Complaint  Patient presents with   Dysmenorrhea    HPI:      Rachel Alexander is a 43 y.o. No obstetric history on file. whose LMP was Patient's last menstrual period was 10/19/2023 (exact date)., presents today for painful menstruation and to review a recent US .   Song reported painful menstrual cycles to her PCP, therefor a pelvic US  was ordered.  Cycles are monthly, last 4 to 5 days with a normal flow, she has cramps the day before and for the first 2 days of her cycle. She did notice she missed a cycle in March.  LMP 08/12/23, she did have some bleeding yesterday and some today.  She uses Tylenol  and a heating pad for pain. She does not have a contraindication to NSAIDS. Madailein has noticed over the past year she has hot flashes, not able to sleep as well, has more headaches and mood changes. Her PCP suggested it is related to perimenopause but did offer any solutions. She is not sexually active. Denies tobacco/nicotine use  or hx of migraines with Aura.  She does have a hx of hypothyroidism   She has 3 grown children ages 45,23, and 35.  Last pap 2021    Patient Active Problem List   Diagnosis Date Noted   Pelvic pain 08/20/2023   Dysuria 11/20/2022   Bipolar 1 disorder, mixed, moderate (HCC) 09/26/2022   PTSD (post-traumatic stress disorder) 09/26/2022   High risk medication use 09/26/2022   Urine frequency 03/31/2022   Hepatic steatosis 11/18/2021   Epigastric pain 11/18/2021   B12 deficiency 08/12/2021   Diaphoresis 07/27/2021   H/O herpes genitalis 12/31/2020   Migraines    COVID-19 03/08/2020   Discharge from right nipple 12/26/2019   Acquired hypothyroidism 10/10/2019   Frequent headaches 07/28/2019   No-show for appointment 04/18/2019   Anxiety and depression 09/04/2018   Cigarette smoker 02/15/2017   Genital herpes simplex 12/22/2016   Attention deficit hyperactivity disorder, predominantly inattentive type 12/22/2016    Constipation 07/23/1998    Past Surgical History:  Procedure Laterality Date   BREAST CYST ASPIRATION Right 2012   COLONOSCOPY N/A 08/24/2019   Procedure: COLONOSCOPY;  Surgeon: Luke Salaam, MD;  Location: [facility] ENDOSCOPY;  Service: Gastroenterology;  Laterality: N/A;   TONSILLECTOMY     TUBAL LIGATION      Family History  Problem Relation Age of Onset   Drug abuse Mother    Anxiety disorder Mother    Depression Mother    Healthy Mother    Schizophrenia Father    Liver cancer Father 73   ADD / ADHD Sister    Diabetes Sister    Drug abuse Brother    Alcohol abuse Brother    Depression Brother    ADD / ADHD Brother    Breast cancer Maternal Aunt    ADD / ADHD Half-Brother    Bipolar disorder Half-Brother    Schizophrenia Half-Brother     Social History   Socioeconomic History   Marital status: Legally Separated    Spouse name: Not on file   Number of children: 3   Years of education: Not on file   Highest education level: Some college, no degree  Occupational History   Not on file  Tobacco Use   Smoking status: Former    Current packs/day: 0.00    Types: Cigarettes    Quit date: 10/06/2017    Years since quitting: 6.0   Smokeless tobacco:  Never  Vaping Use   Vaping status: Every Day   Substances: Nicotine  Substance and Sexual Activity   Alcohol use: Yes    Comment: occ   Drug use: Yes    Types: Marijuana    Comment: weekly . 1 joint   Sexual activity: Not Currently    Birth control/protection: None, Surgical  Other Topics Concern   Not on file  Social History Narrative      Separated from husband , 05/2020    Has 3 children, son 17, daughter 63, son 77.       [employer]         Social Drivers of Health   Financial Resource Strain: Medium Risk (08/25/2019)   Overall Financial Resource Strain (CARDIA)    Difficulty of Paying Living Expenses: Somewhat hard  Food Insecurity: Not on file  Transportation Needs: Not on file  Physical Activity:  Not on file  Stress: Not on file  Social Connections: Not on file  Intimate Partner Violence: Not on file    Outpatient Medications Prior to Visit  Medication Sig Dispense Refill   citalopram  (CELEXA ) 20 MG tablet TAKE 1 TABLET BY MOUTH EVERY DAY 90 tablet 0   hydrOXYzine  (ATARAX ) 25 MG tablet TAKE HALF TO ONE TABLET 30-60 MINUTES PRIOR TO BEDTIME FOR INSOMNIA, ANXIETY. MAY INCREASE TO TWO TABLETS ( 50MG ). 180 tablet 2   prochlorperazine  (COMPAZINE ) 10 MG tablet Take 1 tablet (10 mg total) by mouth every 8 (eight) hours as needed for nausea or vomiting (headache). 12 tablet 0   SUMAtriptan  (IMITREX ) 100 MG tablet TAKE 1 TAB BY MOUTH DAILY. MAY REPEAT ONCE 2 HRS LATER IF HEADACHE PERSISTS OR RECURS. MAX 2 DOSES IN ONE DAY. 12 tablet 11   SYNTHROID  150 MCG tablet Take 2 tablets one day per week and 1 tablet 6 days per week.     valACYclovir  (VALTREX ) 500 MG tablet TAKE 500 MG BY MOUTH X EVERY 12 HOURS X 3 DAYS. ASAP W/IN 24H OF SYMPTOMS ONSET. 30 tablet 2   No facility-administered medications prior to visit.      ROS:  Review of Systems see HPI    OBJECTIVE:   Vitals:  BP 93/71 (BP Location: Left Arm, Patient Position: Sitting, Cuff Size: Normal)   Pulse 68   Ht 5\' 2"  (1.575 m)   Wt 132 lb 4.8 oz (60 kg)   LMP 10/19/2023 (Exact Date)   BMI 24.20 kg/m   Physical Exam Constitutional:      Appearance: Normal appearance.  Cardiovascular:     Rate and Rhythm: Normal rate.  Pulmonary:     Effort: Pulmonary effort is normal.  Genitourinary:    Comments: Exam deferred  Neurological:     General: No focal deficit present.     Mental Status: She is alert.  Psychiatric:        Mood and Affect: Mood normal.        Thought Content: Thought content normal.   Pelvic US  08/22/2023 IMPRESSION: *Questionable ill-defined hypoechoic changes of the lower posterior uterine segment measuring 1.3 x 1.4 cm may correlate with the previously described uterine fibroid are study from August 29, 2019. *Simple follicular cyst of 8 x 7 mm in the right ovary. *1 cm nabothian cyst.    Results: No results found for this or any previous visit (from the past 24 hours).   Assessment/Plan: Painful menstrual periods - Plan: norethindrone-ethinyl estradiol (LOESTRIN 1/20, 21,) 1-20 MG-MCG tablet  Perimenopause - Plan: norethindrone-ethinyl  estradiol (LOESTRIN 1/20, 21,) 1-20 MG-MCG tablet    Meds ordered this encounter  Medications   norethindrone-ethinyl estradiol (LOESTRIN 1/20, 21,) 1-20 MG-MCG tablet    Sig: Take 1 tablet by mouth daily.    Dispense:  28 tablet    Refill:  11   -Reviewed US  report, you may have a fibroid that is shrinking, there is an ovarian cyst which a normal finding, and a nabothian cyst was noted-which is not concerning. Overall the US  was normal and does not require any further intervention.   -Perimenopause, reviewed sxs could be related to perimenopause, offered low dose CCP, accepted. Risks/benefits/side effects reviewed   -Rec Motrin  ATC the day before start of cycle and first day of cycle.   -RTC in 3 months  Berkley B[facility], CNM 10/19/2023 2:24 PM

## 2023-10-19 NOTE — Assessment & Plan Note (Addendum)
 Chronic, stable. continue celexa  20 mg daily, Atarax  25 mg at bedtime prn.  No longer on Seroquel  due to headache.

## 2023-10-20 ENCOUNTER — Encounter: Payer: Self-pay | Admitting: Licensed Practical Nurse

## 2023-10-20 DIAGNOSIS — N951 Menopausal and female climacteric states: Secondary | ICD-10-CM | POA: Insufficient documentation

## 2023-11-14 ENCOUNTER — Other Ambulatory Visit: Payer: Self-pay | Admitting: Family

## 2023-11-14 DIAGNOSIS — R519 Headache, unspecified: Secondary | ICD-10-CM

## 2023-11-14 DIAGNOSIS — F419 Anxiety disorder, unspecified: Secondary | ICD-10-CM

## 2023-11-29 ENCOUNTER — Ambulatory Visit: Payer: Self-pay | Admitting: Family

## 2023-11-29 ENCOUNTER — Encounter: Payer: Self-pay | Admitting: Family

## 2023-11-29 DIAGNOSIS — D7589 Other specified diseases of blood and blood-forming organs: Secondary | ICD-10-CM

## 2023-12-03 ENCOUNTER — Inpatient Hospital Stay: Attending: Oncology | Admitting: Oncology

## 2023-12-03 ENCOUNTER — Encounter: Payer: Self-pay | Admitting: Oncology

## 2023-12-03 ENCOUNTER — Inpatient Hospital Stay

## 2023-12-03 VITALS — BP 118/83 | HR 66 | Temp 96.2°F | Resp 18 | Wt 133.0 lb

## 2023-12-03 DIAGNOSIS — D509 Iron deficiency anemia, unspecified: Secondary | ICD-10-CM | POA: Diagnosis not present

## 2023-12-03 DIAGNOSIS — D7589 Other specified diseases of blood and blood-forming organs: Secondary | ICD-10-CM | POA: Insufficient documentation

## 2023-12-03 DIAGNOSIS — F17290 Nicotine dependence, other tobacco product, uncomplicated: Secondary | ICD-10-CM | POA: Insufficient documentation

## 2023-12-03 DIAGNOSIS — Z79899 Other long term (current) drug therapy: Secondary | ICD-10-CM | POA: Diagnosis not present

## 2023-12-03 DIAGNOSIS — E039 Hypothyroidism, unspecified: Secondary | ICD-10-CM | POA: Insufficient documentation

## 2023-12-03 DIAGNOSIS — Z79620 Long term (current) use of immunosuppressive biologic: Secondary | ICD-10-CM | POA: Insufficient documentation

## 2023-12-03 NOTE — Progress Notes (Signed)
 Hematology/Oncology Consult note [phone] [phone]        REFERRING PROVIDER: [name], FNP   CHIEF COMPLAINTS/REASON FOR VISIT:  Evaluation of macrocytosis.    ASSESSMENT & PLAN:   Macrocytosis She has normal B12 and folate.  Suspect this is due to chronic hypothyroidism. Rule out other etiologies.  Check cbc, smear, B12, MMA, myeloma panel, light chain, retic panel, LDH, haptoglobin. TSH, T4,   Hypothyroidism On levothyroxine , follows up with endocrinology.    Orders Placed This Encounter  Procedures   CBC with Differential/Platelet    Standing Status:   Future    Expected Date:   12/04/2023    Expiration Date:   12/02/2024   Retic Panel    Standing Status:   Future    Expected Date:   12/04/2023    Expiration Date:   12/02/2024   Kappa/lambda light chains    Standing Status:   Future    Expected Date:   12/04/2023    Expiration Date:   12/02/2024   Multiple Myeloma Panel (SPEP&IFE w/QIG)    Standing Status:   Future    Expected Date:   12/04/2023    Expiration Date:   12/02/2024   Lactate dehydrogenase    Standing Status:   Future    Expected Date:   12/04/2023    Expiration Date:   12/02/2024   TSH    Standing Status:   Future    Expected Date:   12/04/2023    Expiration Date:   12/02/2024   T4, free    Standing Status:   Future    Expected Date:   12/04/2023    Expiration Date:   12/02/2024   Technologist smear review    Standing Status:   Future    Expected Date:   12/04/2023    Expiration Date:   12/02/2024    Clinical information::   macrocytosis.   Hepatic function panel    Standing Status:   Future    Expected Date:   12/04/2023    Expiration Date:   12/02/2024   Methylmalonic acid, serum    Standing Status:   Future    Expected Date:   12/04/2023    Expiration Date:   12/02/2024   Follow up in a few weeks to discuss results.  All questions were answered. The patient knows to call the clinic with any problems, questions  or concerns.  [name], MD, PhD [facility] Hematology Oncology 12/03/2023   HISTORY OF PRESENTING ILLNESS:   Rachel Alexander is a  43 y.o.  female with PMH listed below was seen in consultation at the request of  [name], FNP  for evaluation of macrocytosis.   She has chronic history of  macrocytosis for several years, with consistently elevated mean corpuscular volume (MCV)except for a brief normalization three years ago. She experiences persistent fatigue and feeling unwell, with symptoms of feeling cold and shaking after eating.  Her hypothyroidism, diagnosed at age 35 or 20, has been challenging to manage. Thyroid  function tests have shown elevated TSH and decreased free T4 levels. She is currently on Synthroid , with frequent medication adjustments failing to stabilize her thyroid  function. She has gained weight over the past year, from 110 to 130 pounds, and experiences occasional shortness of breath and palpitations, hair loss.   She reports low iron levels, with recent blood work showing decreased ferritin. She has tried iron supplements in the past but experienced constipation, so she attempts to  consume iron-rich foods. Her menstrual periods are heavy for a day or two but are otherwise regular. No history of stomach surgery or blood in her stool.     MEDICAL HISTORY:  Past Medical History:  Diagnosis Date   Colitis    Depression    Frequent headaches    Hypothyroidism    Hypothyroidism    Migraines    Pituitary tumor    Suicidal intent     SURGICAL HISTORY: Past Surgical History:  Procedure Laterality Date   BREAST CYST ASPIRATION Right 2012   COLONOSCOPY N/A 08/24/2019   Procedure: COLONOSCOPY;  Surgeon: Luke Salaam, MD;  Location: [facility] ENDOSCOPY;  Service: Gastroenterology;  Laterality: N/A;   TONSILLECTOMY     TUBAL LIGATION      SOCIAL HISTORY: Social History   Socioeconomic History   Marital status: Legally Separated    Spouse name: Not on file    Number of children: 3   Years of education: Not on file   Highest education level: Some college, no degree  Occupational History   Not on file  Tobacco Use   Smoking status: Former    Current packs/day: 0.00    Types: Cigarettes    Quit date: 10/06/2017    Years since quitting: 6.1   Smokeless tobacco: Never  Vaping Use   Vaping status: Every Day   Substances: Nicotine  Substance and Sexual Activity   Alcohol use: Not Currently   Drug use: Yes    Types: Marijuana    Comment: weekly . 1 joint   Sexual activity: Not Currently    Birth control/protection: None, Surgical  Other Topics Concern   Not on file  Social History Narrative      Separated from husband , 05/2020    Has 3 children, son 68, daughter 64, son 90.       [employer]         Social Drivers of Health   Financial Resource Strain: Medium Risk (08/25/2019)   Overall Financial Resource Strain (CARDIA)    Difficulty of Paying Living Expenses: Somewhat hard  Food Insecurity: Not on file  Transportation Needs: Not on file  Physical Activity: Not on file  Stress: Not on file  Social Connections: Not on file  Intimate Partner Violence: Not on file    FAMILY HISTORY: Family History  Problem Relation Age of Onset   Drug abuse Mother    Anxiety disorder Mother    Depression Mother    Healthy Mother    Schizophrenia Father    Liver cancer Father 14   ADD / ADHD Sister    Diabetes Sister    Drug abuse Brother    Alcohol abuse Brother    Depression Brother    ADD / ADHD Brother    Breast cancer Maternal Aunt    ADD / ADHD Half-Brother    Bipolar disorder Half-Brother    Schizophrenia Half-Brother     ALLERGIES:  is allergic to seroquel  [quetiapine ].  MEDICATIONS:  Current Outpatient Medications  Medication Sig Dispense Refill   citalopram  (CELEXA ) 20 MG tablet TAKE 1 TABLET BY MOUTH EVERY DAY 30 tablet 2   hydrOXYzine  (ATARAX ) 25 MG tablet TAKE HALF TO ONE TABLET 30-60 MINUTES PRIOR TO  BEDTIME FOR INSOMNIA, ANXIETY. MAY INCREASE TO TWO TABLETS ( 50MG ). 180 tablet 2   norethindrone -ethinyl estradiol (LOESTRIN 1/20, 21,) 1-20 MG-MCG tablet Take 1 tablet by mouth daily. 28 tablet 11   prochlorperazine  (COMPAZINE ) 10 MG tablet Take 1 tablet (10  mg total) by mouth every 8 (eight) hours as needed for nausea or vomiting (headache). 12 tablet 0   QUEtiapine  (SEROQUEL ) 25 MG tablet Take 25 mg by mouth at bedtime.     SUMAtriptan  (IMITREX ) 100 MG tablet TAKE 1 TAB BY MOUTH DAILY. MAY REPEAT ONCE 2 HRS LATER IF HEADACHE PERSISTS OR RECURS. MAX 2 DOSES IN ONE DAY. 12 tablet 11   SYNTHROID  150 MCG tablet Take 2 tablets one day per week and 1 tablet 6 days per week.     valACYclovir  (VALTREX ) 500 MG tablet TAKE 500 MG BY MOUTH X EVERY 12 HOURS X 3 DAYS. ASAP W/IN 24H OF SYMPTOMS ONSET. 30 tablet 2   No current facility-administered medications for this visit.    Review of Systems  Constitutional:  Positive for fatigue. Negative for appetite change, chills and fever.  HENT:   Negative for hearing loss and voice change.   Eyes:  Negative for eye problems.  Respiratory:  Negative for chest tightness and cough.   Cardiovascular:  Negative for chest pain.  Gastrointestinal:  Negative for abdominal distention, abdominal pain and blood in stool.  Endocrine: Negative for hot flashes.  Genitourinary:  Negative for difficulty urinating and frequency.   Musculoskeletal:  Negative for arthralgias.  Skin:  Negative for itching and rash.  Neurological:  Negative for extremity weakness.  Hematological:  Negative for adenopathy.  Psychiatric/Behavioral:  Negative for confusion.    PHYSICAL EXAMINATION:  Vitals:   12/03/23 1537  BP: 118/83  Pulse: 66  Resp: 18  Temp: (!) 96.2 F (35.7 C)  SpO2: 100%   Filed Weights   12/03/23 1537  Weight: 133 lb (60.3 kg)    Physical Exam Constitutional:      General: She is not in acute distress. HENT:     Head: Normocephalic and atraumatic.    Eyes:     General: No scleral icterus.   Cardiovascular:     Rate and Rhythm: Normal rate and regular rhythm.     Heart sounds: Normal heart sounds.  Pulmonary:     Effort: Pulmonary effort is normal. No respiratory distress.     Breath sounds: No wheezing.  Abdominal:     General: Bowel sounds are normal. There is no distension.     Palpations: Abdomen is soft.   Musculoskeletal:        General: No deformity. Normal range of motion.     Cervical back: Normal range of motion and neck supple.   Skin:    General: Skin is warm and dry.     Findings: No erythema or rash.   Neurological:     Mental Status: She is alert and oriented to person, place, and time. Mental status is at baseline.     Cranial Nerves: No cranial nerve deficit.   Psychiatric:        Mood and Affect: Mood normal.     LABORATORY DATA:  I have reviewed the data as listed    Latest Ref Rng & Units 10/19/2023    8:43 AM 09/04/2023    8:17 AM 08/20/2023    9:58 AM  CBC  WBC 4.0 - 10.5 K/uL 4.1  4.1  4.5   Hemoglobin 12.0 - 15.0 g/dL 40.9  81.1  91.4   Hematocrit 36.0 - 46.0 % 38.4  37.6  34.4   Platelets 150.0 - 400.0 K/uL 182.0  217  218.0       Latest Ref Rng & Units 09/04/2023    8:17  AM 08/20/2023    9:58 AM 10/26/2022    7:46 AM  CMP  Glucose 70 - 99 mg/dL 96  83    BUN 6 - 20 mg/dL 15  16    Creatinine 1.61 - 1.00 mg/dL 0.96  0.45    Sodium 409 - 145 mmol/L 137  141  136   Potassium 3.5 - 5.1 mmol/L 4.0  4.1    Chloride 98 - 111 mmol/L 102  103    CO2 22 - 32 mmol/L 29  30    Calcium 8.9 - 10.3 mg/dL 9.2  8.9    Total Protein 6.0 - 8.3 g/dL  6.3  6.9   Total Bilirubin 0.2 - 1.2 mg/dL  0.3  0.6   Alkaline Phos 39 - 117 U/L  35  38   AST 0 - 37 U/L  15  15   ALT 0 - 35 U/L  9  11       RADIOGRAPHIC STUDIES: I have personally reviewed the radiological images as listed and agreed with the findings in the report. No results found.

## 2023-12-03 NOTE — Assessment & Plan Note (Signed)
 On levothyroxine , follows up with endocrinology.

## 2023-12-03 NOTE — Assessment & Plan Note (Signed)
 She has normal B12 and folate.  Suspect this is due to chronic hypothyroidism. Rule out other etiologies.  Check cbc, smear, B12, MMA, myeloma panel, light chain, retic panel, LDH, haptoglobin. TSH, T4,

## 2023-12-04 ENCOUNTER — Inpatient Hospital Stay

## 2023-12-04 DIAGNOSIS — D7589 Other specified diseases of blood and blood-forming organs: Secondary | ICD-10-CM | POA: Diagnosis not present

## 2023-12-04 DIAGNOSIS — Z79899 Other long term (current) drug therapy: Secondary | ICD-10-CM | POA: Diagnosis not present

## 2023-12-04 DIAGNOSIS — F17290 Nicotine dependence, other tobacco product, uncomplicated: Secondary | ICD-10-CM | POA: Diagnosis not present

## 2023-12-04 DIAGNOSIS — E039 Hypothyroidism, unspecified: Secondary | ICD-10-CM | POA: Diagnosis not present

## 2023-12-04 DIAGNOSIS — D509 Iron deficiency anemia, unspecified: Secondary | ICD-10-CM

## 2023-12-04 DIAGNOSIS — Z79620 Long term (current) use of immunosuppressive biologic: Secondary | ICD-10-CM | POA: Diagnosis not present

## 2023-12-04 LAB — CBC WITH DIFFERENTIAL/PLATELET
Abs Immature Granulocytes: 0.01 10*3/uL (ref 0.00–0.07)
Basophils Absolute: 0 10*3/uL (ref 0.0–0.1)
Basophils Relative: 0 %
Eosinophils Absolute: 0 10*3/uL (ref 0.0–0.5)
Eosinophils Relative: 0 %
HCT: 37.3 % (ref 36.0–46.0)
Hemoglobin: 12.4 g/dL (ref 12.0–15.0)
Immature Granulocytes: 0 %
Lymphocytes Relative: 37 %
Lymphs Abs: 1.9 10*3/uL (ref 0.7–4.0)
MCH: 32.9 pg (ref 26.0–34.0)
MCHC: 33.2 g/dL (ref 30.0–36.0)
MCV: 98.9 fL (ref 80.0–100.0)
Monocytes Absolute: 0.4 10*3/uL (ref 0.1–1.0)
Monocytes Relative: 7 %
Neutro Abs: 2.7 10*3/uL (ref 1.7–7.7)
Neutrophils Relative %: 56 %
Platelets: 216 10*3/uL (ref 150–400)
RBC: 3.77 MIL/uL — ABNORMAL LOW (ref 3.87–5.11)
RDW: 12.5 % (ref 11.5–15.5)
WBC: 5 10*3/uL (ref 4.0–10.5)
nRBC: 0 % (ref 0.0–0.2)

## 2023-12-04 LAB — HEPATIC FUNCTION PANEL
ALT: 12 U/L (ref 0–44)
AST: 20 U/L (ref 15–41)
Albumin: 3.7 g/dL (ref 3.5–5.0)
Alkaline Phosphatase: 32 U/L — ABNORMAL LOW (ref 38–126)
Bilirubin, Direct: <0.1 mg/dL (ref 0.0–0.2)
Total Bilirubin: 0.5 mg/dL (ref 0.0–1.2)
Total Protein: 6.8 g/dL (ref 6.5–8.1)

## 2023-12-04 LAB — RETIC PANEL
Immature Retic Fract: 7.4 % (ref 2.3–15.9)
RBC.: 3.71 MIL/uL — ABNORMAL LOW (ref 3.87–5.11)
Retic Count, Absolute: 50.1 10*3/uL (ref 19.0–186.0)
Retic Ct Pct: 1.4 % (ref 0.4–3.1)
Reticulocyte Hemoglobin: 35.3 pg (ref >27.9)

## 2023-12-04 LAB — TECHNOLOGIST SMEAR REVIEW: Plt Morphology: NORMAL

## 2023-12-04 LAB — LACTATE DEHYDROGENASE: LDH: 140 U/L (ref 98–192)

## 2023-12-04 LAB — T4, FREE: Free T4: 0.52 ng/dL — ABNORMAL LOW (ref 0.61–1.12)

## 2023-12-04 LAB — TSH: TSH: 51.039 u[IU]/mL — ABNORMAL HIGH (ref 0.350–4.500)

## 2023-12-05 LAB — KAPPA/LAMBDA LIGHT CHAINS
Kappa free light chain: 13 mg/L (ref 3.3–19.4)
Kappa, lambda light chain ratio: 1.43 (ref 0.26–1.65)
Lambda free light chains: 9.1 mg/L (ref 5.7–26.3)

## 2023-12-06 LAB — MULTIPLE MYELOMA PANEL, SERUM
Albumin SerPl Elph-Mcnc: 3.6 g/dL (ref 2.9–4.4)
Albumin/Glob SerPl: 1.3 (ref 0.7–1.7)
Alpha 1: 0.2 g/dL (ref 0.0–0.4)
Alpha2 Glob SerPl Elph-Mcnc: 0.7 g/dL (ref 0.4–1.0)
B-Globulin SerPl Elph-Mcnc: 1.1 g/dL (ref 0.7–1.3)
Gamma Glob SerPl Elph-Mcnc: 0.7 g/dL (ref 0.4–1.8)
Globulin, Total: 2.8 g/dL (ref 2.2–3.9)
IgA: 116 mg/dL (ref 87–352)
IgG (Immunoglobin G), Serum: 671 mg/dL (ref 586–1602)
IgM (Immunoglobulin M), Srm: 110 mg/dL (ref 26–217)
Total Protein ELP: 6.4 g/dL (ref 6.0–8.5)

## 2023-12-07 LAB — METHYLMALONIC ACID, SERUM: Methylmalonic Acid, Quantitative: 89 nmol/L (ref 0–378)

## 2023-12-09 ENCOUNTER — Ambulatory Visit: Payer: Self-pay | Admitting: Oncology

## 2023-12-31 ENCOUNTER — Encounter: Payer: Self-pay | Admitting: Oncology

## 2023-12-31 ENCOUNTER — Inpatient Hospital Stay: Attending: Oncology | Admitting: Oncology

## 2024-01-31 ENCOUNTER — Ambulatory Visit: Admitting: Internal Medicine

## 2024-01-31 NOTE — Progress Notes (Deleted)
 Name: Rachel Alexander  MRN/ DOB: [id number], [birth year]    Age/ Sex: 43 y.o., female     PCP: Rachel Rollene MATSU, FNP   Reason for Endocrinology Evaluation: Hypothyroidism     Initial Endocrinology Clinic Visit: 10/09/2019    PATIENT IDENTIFIER: [name] is a 43 y.o., female with a past medical history of Depression, anxiety and hypothyroidism.  She has followed with [facility] since 10/09/2019 for consultative assistance with management of her hypothyroidism      HISTORICAL SUMMARY:  Pt was diagnosed with hyperthyroidism and goiter at age 71, was put on oral medications ( does not recall ) but over the years  she became hypothyroid requiring LT-4 replacement. She was initially on armour thyroid  but was switched synthroid   In 08/2019.     Cousins with thyroid  disease    SUBJECTIVE:    Today (01/31/2024):  [name] is here for a follow up on hypothyroidism .   Weight has been stable  Denies constipation no  diarrhea  She continues with moodiness and fatigue  Has noted right neck soreness last night  She stopped Prilosec  She stopped Collagen     Tirosint  150 mcg  ,1 tab daily       HISTORY:  Past Medical History:  Past Medical History:  Diagnosis Date   Colitis    Depression    Frequent headaches    Hypothyroidism    Hypothyroidism    Migraines    Pituitary tumor    Suicidal intent    Past Surgical History:  Past Surgical History:  Procedure Laterality Date   BREAST CYST ASPIRATION Right 2012   COLONOSCOPY N/A 08/24/2019   Procedure: COLONOSCOPY;  Surgeon: Rachel Bi, MD;  Location: [facility] ENDOSCOPY;  Service: Gastroenterology;  Laterality: N/A;   TONSILLECTOMY     TUBAL LIGATION     Social History:  reports that she quit smoking about 6 years ago. Her smoking use included cigarettes. She has never used smokeless tobacco. She reports that she does not currently use alcohol. She reports current drug use. Drug: Marijuana. Family  History:  Family History  Problem Relation Age of Onset   Drug abuse Mother    Anxiety disorder Mother    Depression Mother    Healthy Mother    Schizophrenia Father    Liver cancer Father 54   ADD / ADHD Sister    Diabetes Sister    Drug abuse Brother    Alcohol abuse Brother    Depression Brother    ADD / ADHD Brother    Breast cancer Maternal Aunt    ADD / ADHD Half-Brother    Bipolar disorder Half-Brother    Schizophrenia Half-Brother      HOME MEDICATIONS: Allergies as of 01/31/2024       Reactions   Seroquel  [quetiapine ]    Intense HA, seen in ED 08/2023        Medication List        Accurate as of January 31, 2024  7:18 AM. If you have any questions, ask your nurse or doctor.          citalopram  20 MG tablet Commonly known as: CELEXA  TAKE 1 TABLET BY MOUTH EVERY DAY   hydrOXYzine  25 MG tablet Commonly known as: ATARAX  TAKE HALF TO ONE TABLET 30-60 MINUTES PRIOR TO BEDTIME FOR INSOMNIA, ANXIETY. MAY INCREASE TO TWO TABLETS ( 50MG ).   norethindrone -ethinyl estradiol 1-20 MG-MCG tablet Commonly known as: Loestrin 1/20 (21) Take 1 tablet  by mouth daily.   prochlorperazine  10 MG tablet Commonly known as: COMPAZINE  Take 1 tablet (10 mg total) by mouth every 8 (eight) hours as needed for nausea or vomiting (headache).   QUEtiapine  25 MG tablet Commonly known as: SEROQUEL  Take 25 mg by mouth at bedtime.   SUMAtriptan  100 MG tablet Commonly known as: IMITREX  TAKE 1 TAB BY MOUTH DAILY. MAY REPEAT ONCE 2 HRS LATER IF HEADACHE PERSISTS OR RECURS. MAX 2 DOSES IN ONE DAY.   Synthroid  150 MCG tablet Generic drug: levothyroxine  Take 2 tablets one day per week and 1 tablet 6 days per week.   valACYclovir  500 MG tablet Commonly known as: VALTREX  TAKE 500 MG BY MOUTH X EVERY 12 HOURS X 3 DAYS. ASAP W/IN 24H OF SYMPTOMS ONSET.          OBJECTIVE:   PHYSICAL EXAM: VS: There were no vitals taken for this visit.  EXAM: General: Pt appears well and is  in NAD  Neck: General: Supple without adenopathy. Thyroid : Thyroid  size normal.  No goiter or nodules appreciated.  Lungs: Clear with good BS bilat with no rales, rhonchi, or wheezes  Heart: Auscultation: RRR.  Abdomen: Normoactive bowel sounds, soft, nontender, without masses or organomegaly palpable  Extremities:  BL LE: No pretibial edema normal ROM and strength.  Mental Status: Judgment, insight: Intact Orientation: Oriented to time, place, and person Mood and affect: No depression, anxiety, or agitation     DATA REVIEWED:  Latest Reference Range & Units 05/15/22 15:49  TSH 0.35 - 5.50 uIU/mL 13.39 (H)  (H): Data is abnormally high   ASSESSMENT / PLAN / RECOMMENDATIONS:   Hypothyroidism:    -It has been difficult optimizing her TSH level, she assures me compliance and I suspect she inability to absorb LT-4  -We even switch from  Synthroid  to Tirosint  but TSH remains above goal  - Will start Liothyronine  as below    Medications   Continue  Tirosint  150 mcg daily Start Liothyronine  5 mcg daily     F/U in 4 months   Addendum: Discussed labs with pt on 02/14/2021 at 1605 Signed electronically by: Rachel Alexander Butts, MD  [facility]  [facility] [address] [place] [place], [place] [address] Phone: [phone] FAX: [phone]      CC: Rachel Rollene MATSU, FNP [address] [place] [address] Phone: [phone]  Fax: [phone]   Return to Endocrinology clinic as below: Future Appointments  Date Time Provider Department Center  01/31/2024  8:50 AM Rachel Alexander, Rachel Redgie, MD [facility] None

## 2024-02-06 ENCOUNTER — Ambulatory Visit: Admitting: "Endocrinology

## 2024-02-08 ENCOUNTER — Other Ambulatory Visit: Payer: Self-pay | Admitting: Family

## 2024-02-08 DIAGNOSIS — E039 Hypothyroidism, unspecified: Secondary | ICD-10-CM

## 2024-02-08 DIAGNOSIS — F419 Anxiety disorder, unspecified: Secondary | ICD-10-CM

## 2024-04-28 ENCOUNTER — Other Ambulatory Visit: Payer: Self-pay | Admitting: Family

## 2024-04-28 DIAGNOSIS — Z8619 Personal history of other infectious and parasitic diseases: Secondary | ICD-10-CM

## 2024-05-10 ENCOUNTER — Other Ambulatory Visit: Payer: Self-pay | Admitting: Family

## 2024-05-10 DIAGNOSIS — F32A Depression, unspecified: Secondary | ICD-10-CM

## 2024-05-16 ENCOUNTER — Other Ambulatory Visit: Payer: Self-pay | Admitting: Family

## 2024-05-16 DIAGNOSIS — F32A Depression, unspecified: Secondary | ICD-10-CM

## 2024-05-20 NOTE — Telephone Encounter (Signed)
 Over six months okay to refill citalopram .

## 2024-05-22 NOTE — Telephone Encounter (Signed)
 Was unable to LVM so I sent pt a mychart message regarding message below   Call patient Before I can refill Celexa , there is a drug interaction with Celexa  and Seroquel .  Please confirm if patient is on Seroquel ?  At last visit she has stopped Seroquel .

## 2024-05-23 ENCOUNTER — Other Ambulatory Visit: Payer: Self-pay | Admitting: Family

## 2024-05-23 DIAGNOSIS — F419 Anxiety disorder, unspecified: Secondary | ICD-10-CM

## 2024-05-23 MED ORDER — CITALOPRAM HYDROBROMIDE 20 MG PO TABS: 20.0000 mg | ORAL_TABLET | Freq: Every day | ORAL | 3 refills | Status: AC

## 2024-07-15 ENCOUNTER — Other Ambulatory Visit: Payer: Self-pay | Admitting: Family

## 2024-07-15 DIAGNOSIS — F32A Depression, unspecified: Secondary | ICD-10-CM

## 2024-08-07 ENCOUNTER — Encounter: Payer: Self-pay | Admitting: Family
